# Patient Record
Sex: Female | Born: 1960
Health system: Southern US, Community
[De-identification: ages and names within clinical notes are randomized; demographics above are authoritative.]

## PROBLEM LIST (undated history)

## (undated) DIAGNOSIS — Z9889 Other specified postprocedural states: Secondary | ICD-10-CM

## (undated) DIAGNOSIS — K219 Gastro-esophageal reflux disease without esophagitis: Secondary | ICD-10-CM

## (undated) DIAGNOSIS — Z950 Presence of cardiac pacemaker: Secondary | ICD-10-CM

## (undated) DIAGNOSIS — IMO0001 Reserved for inherently not codable concepts without codable children: Secondary | ICD-10-CM

## (undated) DIAGNOSIS — F32A Depression, unspecified: Secondary | ICD-10-CM

## (undated) DIAGNOSIS — I4891 Unspecified atrial fibrillation: Secondary | ICD-10-CM

## (undated) DIAGNOSIS — I340 Nonrheumatic mitral (valve) insufficiency: Secondary | ICD-10-CM

## (undated) DIAGNOSIS — D649 Anemia, unspecified: Secondary | ICD-10-CM

## (undated) DIAGNOSIS — I5032 Chronic diastolic (congestive) heart failure: Secondary | ICD-10-CM

## (undated) DIAGNOSIS — F329 Major depressive disorder, single episode, unspecified: Secondary | ICD-10-CM

## (undated) DIAGNOSIS — I499 Cardiac arrhythmia, unspecified: Secondary | ICD-10-CM

## (undated) HISTORY — DX: Presence of cardiac pacemaker: Z95.0

## (undated) HISTORY — DX: Chronic diastolic (congestive) heart failure: I50.32

## (undated) HISTORY — PX: CATARACT EXTRACTION, BILATERAL: SHX1313

## (undated) HISTORY — DX: Nonrheumatic mitral (valve) insufficiency: I34.0

## (undated) HISTORY — DX: Unspecified atrial fibrillation: I48.91

---

## 1993-04-10 HISTORY — PX: CARDIAC PACEMAKER PLACEMENT: SHX583

## 2003-08-13 ENCOUNTER — Ambulatory Visit (HOSPITAL_COMMUNITY): Admission: RE | Admit: 2003-08-13 | Discharge: 2003-08-13 | Payer: Self-pay | Admitting: Obstetrics and Gynecology

## 2003-09-01 ENCOUNTER — Encounter: Admission: RE | Admit: 2003-09-01 | Discharge: 2003-11-30 | Payer: Self-pay | Admitting: Surgery

## 2003-09-09 HISTORY — PX: GASTRIC BYPASS: SHX52

## 2004-01-06 ENCOUNTER — Encounter: Admission: RE | Admit: 2004-01-06 | Discharge: 2004-04-05 | Payer: Self-pay | Admitting: Surgery

## 2004-01-19 ENCOUNTER — Inpatient Hospital Stay (HOSPITAL_COMMUNITY): Admission: RE | Admit: 2004-01-19 | Discharge: 2004-01-22 | Payer: Self-pay | Admitting: Surgery

## 2004-04-20 ENCOUNTER — Encounter: Admission: RE | Admit: 2004-04-20 | Discharge: 2004-07-19 | Payer: Self-pay | Admitting: Surgery

## 2004-05-31 ENCOUNTER — Ambulatory Visit: Payer: Self-pay | Admitting: Internal Medicine

## 2005-05-12 ENCOUNTER — Ambulatory Visit: Payer: Self-pay | Admitting: Internal Medicine

## 2006-04-10 HISTORY — PX: ABDOMINAL ADHESION SURGERY: SHX90

## 2006-05-05 ENCOUNTER — Inpatient Hospital Stay (HOSPITAL_COMMUNITY): Admission: EM | Admit: 2006-05-05 | Discharge: 2006-05-06 | Payer: Self-pay | Admitting: Surgery

## 2006-05-10 ENCOUNTER — Inpatient Hospital Stay (HOSPITAL_COMMUNITY): Admission: EM | Admit: 2006-05-10 | Discharge: 2006-05-15 | Payer: Self-pay | Admitting: Emergency Medicine

## 2006-05-10 ENCOUNTER — Encounter: Admission: RE | Admit: 2006-05-10 | Discharge: 2006-05-10 | Payer: Self-pay | Admitting: General Surgery

## 2006-07-04 ENCOUNTER — Ambulatory Visit: Payer: Self-pay | Admitting: Internal Medicine

## 2006-07-04 LAB — CONVERTED CEMR LAB
BUN: 12 mg/dL (ref 6–23)
Basophils Absolute: 0 10*3/uL (ref 0.0–0.1)
Basophils Relative: 0.8 % (ref 0.0–1.0)
CO2: 27 meq/L (ref 19–32)
Calcium: 8.7 mg/dL (ref 8.4–10.5)
Chloride: 109 meq/L (ref 96–112)
Creatinine, Ser: 0.6 mg/dL (ref 0.4–1.2)
Eosinophils Absolute: 0.1 10*3/uL (ref 0.0–0.6)
Eosinophils Relative: 1.6 % (ref 0.0–5.0)
GFR calc Af Amer: 139 mL/min
GFR calc non Af Amer: 115 mL/min
Glucose, Bld: 93 mg/dL (ref 70–99)
HCT: 32.2 % — ABNORMAL LOW (ref 36.0–46.0)
Hemoglobin: 10.7 g/dL — ABNORMAL LOW (ref 12.0–15.0)
INR: 1.1 (ref 0.9–2.0)
Lymphocytes Relative: 38.7 % (ref 12.0–46.0)
MCHC: 33.1 g/dL (ref 30.0–36.0)
MCV: 84.1 fL (ref 78.0–100.0)
Monocytes Absolute: 0.4 10*3/uL (ref 0.2–0.7)
Monocytes Relative: 7.3 % (ref 3.0–11.0)
Neutro Abs: 2.9 10*3/uL (ref 1.4–7.7)
Neutrophils Relative %: 51.6 % (ref 43.0–77.0)
Platelets: 206 10*3/uL (ref 150–400)
Potassium: 3.6 meq/L (ref 3.5–5.1)
Prothrombin Time: 12.8 s (ref 10.0–14.0)
RBC: 3.83 M/uL — ABNORMAL LOW (ref 3.87–5.11)
RDW: 16.7 % — ABNORMAL HIGH (ref 11.5–14.6)
Sodium: 143 meq/L (ref 135–145)
WBC: 5.5 10*3/uL (ref 4.5–10.5)
aPTT: 29.3 s (ref 26.5–36.5)

## 2006-07-06 ENCOUNTER — Ambulatory Visit: Payer: Self-pay | Admitting: Cardiology

## 2006-07-10 ENCOUNTER — Ambulatory Visit: Payer: Self-pay | Admitting: Internal Medicine

## 2006-07-10 ENCOUNTER — Ambulatory Visit (HOSPITAL_COMMUNITY): Admission: RE | Admit: 2006-07-10 | Discharge: 2006-07-10 | Payer: Self-pay | Admitting: Internal Medicine

## 2006-07-27 ENCOUNTER — Ambulatory Visit: Payer: Self-pay | Admitting: Internal Medicine

## 2007-08-01 ENCOUNTER — Ambulatory Visit: Payer: Self-pay | Admitting: Internal Medicine

## 2007-08-13 ENCOUNTER — Ambulatory Visit: Payer: Self-pay | Admitting: Cardiology

## 2007-09-11 ENCOUNTER — Inpatient Hospital Stay (HOSPITAL_BASED_OUTPATIENT_CLINIC_OR_DEPARTMENT_OTHER): Admission: RE | Admit: 2007-09-11 | Discharge: 2007-09-11 | Payer: Self-pay | Admitting: Cardiology

## 2007-09-11 ENCOUNTER — Ambulatory Visit: Payer: Self-pay | Admitting: Cardiology

## 2007-09-11 HISTORY — PX: CARDIAC CATHETERIZATION: SHX172

## 2007-09-26 ENCOUNTER — Ambulatory Visit: Payer: Self-pay | Admitting: Internal Medicine

## 2008-05-05 ENCOUNTER — Ambulatory Visit: Payer: Self-pay | Admitting: Internal Medicine

## 2008-05-05 LAB — CONVERTED CEMR LAB
BUN: 10 mg/dL (ref 6–23)
Basophils Absolute: 0 10*3/uL (ref 0.0–0.1)
Basophils Relative: 0.7 % (ref 0.0–3.0)
CO2: 26 meq/L (ref 19–32)
Calcium: 9 mg/dL (ref 8.4–10.5)
Chloride: 107 meq/L (ref 96–112)
Creatinine, Ser: 0.8 mg/dL (ref 0.4–1.2)
Eosinophils Absolute: 0.1 10*3/uL (ref 0.0–0.7)
Eosinophils Relative: 1.5 % (ref 0.0–5.0)
GFR calc Af Amer: 99 mL/min
GFR calc non Af Amer: 82 mL/min
Glucose, Bld: 99 mg/dL (ref 70–99)
HCT: 31.4 % — ABNORMAL LOW (ref 36.0–46.0)
Hemoglobin: 10.2 g/dL — ABNORMAL LOW (ref 12.0–15.0)
INR: 1.1 — ABNORMAL HIGH (ref 0.8–1.0)
Lymphocytes Relative: 25.1 % (ref 12.0–46.0)
MCHC: 32.6 g/dL (ref 30.0–36.0)
MCV: 80.6 fL (ref 78.0–100.0)
Monocytes Absolute: 0.3 10*3/uL (ref 0.1–1.0)
Monocytes Relative: 4.6 % (ref 3.0–12.0)
Neutro Abs: 4.5 10*3/uL (ref 1.4–7.7)
Neutrophils Relative %: 68.1 % (ref 43.0–77.0)
Platelets: 264 10*3/uL (ref 150–400)
Potassium: 4.4 meq/L (ref 3.5–5.1)
Prothrombin Time: 11.8 s (ref 10.9–13.3)
RBC: 3.9 M/uL (ref 3.87–5.11)
RDW: 17 % — ABNORMAL HIGH (ref 11.5–14.6)
Sodium: 139 meq/L (ref 135–145)
WBC: 6.6 10*3/uL (ref 4.5–10.5)
aPTT: 30.2 s — ABNORMAL HIGH (ref 21.7–29.8)
hCG, Beta Chain, Quant, S: 0.89 milliintl units/mL

## 2008-05-07 ENCOUNTER — Inpatient Hospital Stay (HOSPITAL_COMMUNITY): Admission: RE | Admit: 2008-05-07 | Discharge: 2008-05-12 | Payer: Self-pay | Admitting: Internal Medicine

## 2008-05-07 ENCOUNTER — Ambulatory Visit: Payer: Self-pay | Admitting: Internal Medicine

## 2008-05-07 ENCOUNTER — Ambulatory Visit: Payer: Self-pay | Admitting: Thoracic Surgery (Cardiothoracic Vascular Surgery)

## 2008-05-11 ENCOUNTER — Ambulatory Visit: Payer: Self-pay | Admitting: Infectious Diseases

## 2008-05-20 ENCOUNTER — Ambulatory Visit: Payer: Self-pay | Admitting: Internal Medicine

## 2008-05-20 ENCOUNTER — Ambulatory Visit: Payer: Self-pay

## 2008-05-20 LAB — CONVERTED CEMR LAB
Basophils Absolute: 0 10*3/uL (ref 0.0–0.1)
Basophils Relative: 0.1 % (ref 0.0–3.0)
Eosinophils Absolute: 0.2 10*3/uL (ref 0.0–0.7)
Eosinophils Relative: 2.2 % (ref 0.0–5.0)
HCT: 29.5 % — ABNORMAL LOW (ref 36.0–46.0)
Hemoglobin: 9.5 g/dL — ABNORMAL LOW (ref 12.0–15.0)
Lymphocytes Relative: 29.9 % (ref 12.0–46.0)
MCHC: 32.2 g/dL (ref 30.0–36.0)
MCV: 80.1 fL (ref 78.0–100.0)
Monocytes Absolute: 0.6 10*3/uL (ref 0.1–1.0)
Monocytes Relative: 8.7 % (ref 3.0–12.0)
Neutro Abs: 4 10*3/uL (ref 1.4–7.7)
Neutrophils Relative %: 59.1 % (ref 43.0–77.0)
Platelets: 214 10*3/uL (ref 150–400)
RBC: 3.68 M/uL — ABNORMAL LOW (ref 3.87–5.11)
RDW: 16.8 % — ABNORMAL HIGH (ref 11.5–14.6)
WBC: 6.9 10*3/uL (ref 4.5–10.5)

## 2008-05-28 ENCOUNTER — Ambulatory Visit: Payer: Self-pay | Admitting: Cardiology

## 2008-09-02 ENCOUNTER — Encounter (INDEPENDENT_AMBULATORY_CARE_PROVIDER_SITE_OTHER): Payer: Self-pay | Admitting: *Deleted

## 2008-11-02 ENCOUNTER — Encounter (INDEPENDENT_AMBULATORY_CARE_PROVIDER_SITE_OTHER): Payer: Self-pay | Admitting: *Deleted

## 2009-03-02 ENCOUNTER — Ambulatory Visit: Payer: Self-pay | Admitting: Internal Medicine

## 2009-03-02 DIAGNOSIS — Z950 Presence of cardiac pacemaker: Secondary | ICD-10-CM | POA: Insufficient documentation

## 2009-03-02 DIAGNOSIS — I442 Atrioventricular block, complete: Secondary | ICD-10-CM | POA: Insufficient documentation

## 2009-03-02 DIAGNOSIS — I4891 Unspecified atrial fibrillation: Secondary | ICD-10-CM | POA: Insufficient documentation

## 2009-06-04 ENCOUNTER — Encounter: Payer: Self-pay | Admitting: Internal Medicine

## 2009-06-21 ENCOUNTER — Encounter: Payer: Self-pay | Admitting: Internal Medicine

## 2009-06-30 ENCOUNTER — Ambulatory Visit: Payer: Self-pay | Admitting: Internal Medicine

## 2009-07-06 ENCOUNTER — Encounter: Payer: Self-pay | Admitting: Internal Medicine

## 2009-10-13 ENCOUNTER — Encounter: Payer: Self-pay | Admitting: Internal Medicine

## 2010-03-08 ENCOUNTER — Ambulatory Visit: Payer: Self-pay | Admitting: Internal Medicine

## 2010-03-10 ENCOUNTER — Encounter: Payer: Self-pay | Admitting: Internal Medicine

## 2010-04-30 ENCOUNTER — Encounter: Payer: Self-pay | Admitting: Surgery

## 2010-05-10 NOTE — Letter (Signed)
Summary: Device-Delinquent Phone Journalist, newspaper, Main Office  [address]   [place], [place] [address]   Phone: [phone]  Fax: [phone]     June 21, 2009 MRN: [id number]   Daily Robar [address] [place], [place]  [address]   Dear Ms. Smyth,  According to our records, you were scheduled for a device phone transmission on  June 02, 2009.     We did not receive any results from this check.  If you transmitted on your scheduled day, please call us to help troubleshoot your system.  If you forgot to send your transmission, please send one upon receipt of this letter.  Thank you,   Architectural technologist [facility]

## 2010-05-10 NOTE — Cardiovascular Report (Signed)
Summary: Office Visit   Office Visit   Imported By: Roderic Ovens 03/11/2009 16:13:40  _____________________________________________________________________  External Attachment:    Type:   Image     Comment:   External Document

## 2010-05-10 NOTE — Letter (Signed)
Summary: Appointment - Reminder 2  [facility], Main Office  [address]   [place], [place] [address]   Phone: [phone]  Fax: [phone]     Sep 02, 2008 MRN: [id number]   [name] [name] [address] [place], [place]  [address]   Dear [name],  Our records indicate that it is time to schedule a follow-up appointment.  Dr. Graciela Husbands recommended that you follow up with [place] in May, 2010. It is very important that we reach you to schedule this appointment.   We look forward to participating in your health care needs. Please contact us at the number listed above at your earliest convenience to schedule your appointment.  If you are unable to make an appointment at this time, give [place] a call so we can update our records.   Sincerely,   Burnard Leigh [facility] Scheduling Team

## 2010-05-10 NOTE — Letter (Signed)
Summary: Device-Delinquent Phone Journalist, newspaper, Main Office  [address]   [place], [place] [address]   Phone: [phone]  Fax: [phone]     June 04, 2009 MRN: [id number]   [name] [name] [address] [place], [place]  [address]   Dear Ms. Feldkamp,  According to our records, you were scheduled for a device phone transmission on   June 02, 2009.     We did not receive any results from this check.  If you transmitted on your scheduled day, please call us to help troubleshoot your system.  If you forgot to send your transmission, please send one upon receipt of this letter.  Thank you,   Architectural technologist [facility]

## 2010-05-10 NOTE — Letter (Signed)
Summary: Remote Device Check  [facility], Main Office  [address]   [place], [place] [address]   Phone: [phone]  Fax: [phone]     July 06, 2009 MRN: [id number]   Lurlie Schroepfer [address] [place], [place]  [address]   Dear Ms. Bruyere,   Your remote transmission was recieved and reviewed by your physician.  All diagnostics were within normal limits for you.  __X___Your next transmission is scheduled for:   October 06, 2009.  Please transmit at any time this day.  If you have a wireless device your transmission will be sent automatically.      Sincerely,  Proofreader

## 2010-05-10 NOTE — Cardiovascular Report (Signed)
Summary: Office Visit Remote   Office Visit Remote   Imported By: Roderic Ovens 07/08/2009 11:58:28  _____________________________________________________________________  External Attachment:    Type:   Image     Comment:   External Document

## 2010-05-10 NOTE — Letter (Signed)
Summary: Appointment - Reminder 2  [facility], Main Office  [address]   [place], [place] [address]   Phone: [phone]  Fax: [phone]     November 02, 2008 MRN: [id number]   Erinne Ruffini [address] [place] [address] [place], [place]  [address]   Dear Ms. Wille,  Our records indicate that you are overdue for a follow up appointment with Dr. Graciela Husbands.  It is very important that we reach you to schedule this appointment.   We look forward to participating in your health care needs. Please contact us at the number listed above at your earliest convenience to schedule your appointment.  If you are unable to make an appointment at this time, give [place] a call so we can update our records.  Sincerely,   Burnard Leigh [facility] Scheduling Team

## 2010-05-10 NOTE — Letter (Signed)
Summary: Device-Delinquent Phone Journalist, newspaper, Main Office  [address]   [place], [place] [address]   Phone: [phone]  Fax: [phone]     October 13, 2009 MRN: [id number]   [name] [name] [address] [place], [place]  [address]   Dear [name],  According to our records, you were scheduled for a device phone transmission on 10-06-2009.     We did not receive any results from this check.  If you transmitted on your scheduled day, please call us to help troubleshoot your system.  If you forgot to send your transmission, please send one upon receipt of this letter.  Thank you,   Architectural technologist [facility]

## 2010-05-10 NOTE — Assessment & Plan Note (Signed)
Summary: PC2      Allergies Added: NKDA  History of Present Illness: [name] is seen in followup for permanent atrial arrhythmias; she is status post AV junction ablation and pacemaker implantation and recent generator upgrade.   She is doing pretty well. She has mild chronic shortness of breath; there is no significant change in her peripheral edema. She does have chronic fatigue but she introduced this to the fact that she continues to work weekend night option  Current Medications (verified): 1)  Enalapril Maleate 10 Mg Tabs (Enalapril Maleate) .... Take One Tablet Once Daily 2)  Coreg 3.125 Mg Tabs (Carvedilol) .... Take Oen Tablet Two Times A Day  Allergies (verified): No Known Drug Allergies  Past History:  Past Medical History: Last updated: 03/01/2009  A history of pacemaker implantation 1995 at [facility] with atrioventricular node ablation. -Medtronic Adapta ADDRL1      a.     Generator change out 2 years ago.  Echocardiogram March 2009 ejection fraction of 50%.  Left heart catheterization September 11, 2007 angiographically normal       coronary arteries.  History of gastric bypass June 2005.  Lysis of adhesions January 2008.  Iron-deficiency anemia since the gastric bypass.  The patient is       anemic.  Her hemoglobin runs between 9 and 10.   Past Surgical History: Last updated: 03/01/2009 Pacemaker implantation- Medtronic Adapta ADDRL1 status post Roux-en-Y laparoscopic gastric bypass back in 2005 by Dr. Sandria Bales. Newman History of ablation secondary supraventricular tachycardia  Vital Signs:  Patient profile:   50 year old female Height:      65 inches Weight:      171 pounds BMI:     28.56 Pulse rate:   60 / minute BP sitting:   124 / 72  (right arm) Cuff size:   regular  Vitals Entered By: Judithe Modest CMA (March 02, 2009 11:10 AM)  Physical Exam  General:  The patient was alert and oriented in no acute  distress. HEENT Normal.  Neck veins were flat, carotids were brisk.  Lungs were clear.  Heart sounds were regular without murmurs or gallops. S2 is probably have a Abdomen was soft with active bowel sounds. There is no clubbing cyanosis trace edema Skin Warm and dry    PPM Specifications Following MD:  Sherryl Manges, MD     PPM Vendor:  Medtronic     PPM Model Number:  ADDRL1     PPM Serial Number:  [id number] PPM DOI:  05/11/2008     PPM Implanting MD:  Sherryl Manges, MD  Lead 1    Location: RA     DOI: 05/11/2008     Model #: [id number]     Serial #: [id number]     Status: active Lead 2    Location: RV     DOI: 05/11/2008     Model #: [id number]     Serial #: [id number]     Status: active  Magnet Response Rate:  BOL 85 ERI  65  Indications:  CHB  Explantation Comments:  RT IMPLANT.  LT IMPLANT REMOVED 2/10 FOR INFECTION  PPM Follow Up Remote Check?  No Battery Voltage:  2.79 V     Battery Est. Longevity:  8.5 years     Pacer Dependent:  Yes       PPM Device Measurements Atrium  Amplitude: 0.7 mV, Impedance: 563 ohms,  Right Ventricle  Impedance: 374 ohms, Threshold: 0.875 V at 0.4 msec  Episodes Coumadin:  No Ventricular High Rate:  0     Atrial Pacing:  22.6%     Ventricular Pacing:  100%  Parameters Mode:  DDIR     Lower Rate Limit:  60     Upper Rate Limit:  130 Paced AV Delay:  150     Next Remote Date:  06/02/2009     Next Cardiology Appt Due:  02/08/2010 Tech Comments:  No parameter changes.  Device function normal.  Carelink transmissions every 3 months.  ROV 1 year Dr. Graciela Husbands. Altha Harm, LPN  March 02, 2009 11:21 AM   Impression & Recommendations:  Problem # 1:  ATRIAL FIBRILLATION (ICD-427.31) She has chronic atrial arrhythmias with AV junction ablation Her updated medication list for this problem includes:    Coreg 3.125 Mg Tabs (Carvedilol) .Marland Kitchen... Take oen tablet two times a day  Problem # 2:  AV BLOCK, COMPLETE (ICD-426.0) She underwent AV junction ablation  because of a tachycardia induced cardiomyopathy; this is now largely resolved Her updated medication list for this problem includes:    Enalapril Maleate 10 Mg Tabs (Enalapril maleate) .Marland Kitchen... Take one tablet once daily    Coreg 3.125 Mg Tabs (Carvedilol) .Marland Kitchen... Take oen tablet two times a day  Problem # 3:  CARDIOMYOPATHY,TACHYCARDIA-RESOLVED (ICD-425.9) the patient be continued on her ACE inhibitor and beta blockers Her updated medication list for this problem includes:    Enalapril Maleate 10 Mg Tabs (Enalapril maleate) .Marland Kitchen... Take one tablet once daily    Coreg 3.125 Mg Tabs (Carvedilol) .Marland Kitchen... Take oen tablet two times a day  Problem # 4:  PACEMAKER,MDT (ICD-V45.01) normal device function

## 2010-05-10 NOTE — Miscellaneous (Signed)
Summary: Device preload  Clinical Lists Changes  Observations: Added new observation of PPMEXPLCOMM: RT IMPLANT.  LT IMPLANT REMOVED 2/10 FOR INFECTION (05/20/2008 12:56) Added new observation of PPM INDICATN: CHB (05/20/2008 12:56) Added new observation of MAGNET RTE: BOL 85 ERI  65 (05/20/2008 12:56) Added new observation of PPMLEADSTAT2: active (05/20/2008 12:56) Added new observation of PPMLEADSER2: [id number] (05/20/2008 12:56) Added new observation of PPMLEADMOD2: 5076  (05/20/2008 12:56) Added new observation of PPMLEADDOI2: 05/11/2008  (05/20/2008 12:56) Added new observation of PPMLEADLOC2: RV  (05/20/2008 12:56) Added new observation of PPMLEADSTAT1: active  (05/20/2008 12:56) Added new observation of PPMLEADSER1: [id number]  (05/20/2008 12:56) Added new observation of PPMLEADMOD1: 5076  (05/20/2008 12:56) Added new observation of PPMLEADDOI1: 05/11/2008  (05/20/2008 12:56) Added new observation of PPMLEADLOC1: RA  (05/20/2008 12:56) Added new observation of PPM IMP MD: Sherryl Manges, MD  (05/20/2008 12:56) Added new observation of PPM DOI: 05/11/2008  (05/20/2008 12:56) Added new observation of PPM SERL#: [id number]  (05/20/2008 12:56) Added new observation of PPM MODL#: ADDRL1  (05/20/2008 37:10) Added new observation of PACEMAKERMFG: Medtronic  (05/20/2008 12:56) Added new observation of CARDIO MD: Sherryl Manges, MD  (05/20/2008 12:56)      PPM Specifications Following MD:  Sherryl Manges, MD     PPM Vendor:  Medtronic     PPM Model Number:  ADDRL1     PPM Serial Number:  [id number] PPM DOI:  05/11/2008       Lead 1    Location: RA     DOI: 05/11/2008     Model #: [id number]     Serial #: [id number]     Status: active Lead 2    Location: RV     DOI: 05/11/2008     Model #: [id number]     Serial #: [id number]     Status: active  Magnet Response Rate:  BOL 85 ERI  65  Indications:  CHB  Explantation Comments:  RT IMPLANT.  LT IMPLANT REMOVED 2/10 FOR INFECTION  ICD  Specifications Following MD: Sherryl Manges, MD

## 2010-05-10 NOTE — Assessment & Plan Note (Signed)
Summary: MEDTRONIC  Medications Added FUROSEMIDE 20 MG TABS (FUROSEMIDE) daily for two weeks,  then every other day.      Allergies Added: NKDA  Visit Type:  PPM-Medtronic  CC:  shortness of breath and swelling.  History of Present Illness: [name] [name] is seen in followup for permanent atrial arrhythmias; she is status post AV junction ablation and pacemaker implantation and recent generator upgrade that was complicated by infection requiring extraction and replacing on the contralateral (now right) side  She is doing pretty well. She has mild chronic shortness of breath; there is no significant change in her peripheral edema. She is noted however significant change in her dyspnea as well as peripheral edema over the last couple of months. There've been no company chest pain. Some years ago she had a short course of diuretics associated with similar symptoms with which there was improvement.  Problems Prior to Update: 1)  Cardiomyopathy,tachycardia-resolved  (ICD-425.9) 2)  Pacemaker,mdt  (ICD-V45.01) 3)  Av Block, Complete  (ICD-426.0) 4)  Atrial Fibrillation  (ICD-427.31)  Current Medications (verified): 1)  Enalapril Maleate 10 Mg Tabs (Enalapril Maleate) .... Take One Tablet Once Daily 2)  Coreg 3.125 Mg Tabs (Carvedilol) .... Take Oen Tablet Two Times A Day  Allergies (verified): No Known Drug Allergies  Past History:  Past Medical History: Last updated: 03/01/2009  A history of pacemaker implantation 1995 at [facility] with atrioventricular node ablation. -Medtronic Adapta ADDRL1      a.     Generator change out 2 years ago.  Echocardiogram March 2009 ejection fraction of 50%.  Left heart catheterization September 11, 2007 angiographically normal       coronary arteries.  History of gastric bypass June 2005.  Lysis of adhesions January 2008.  Iron-deficiency anemia since the gastric bypass.  The patient is       anemic.  Her hemoglobin runs  between 9 and 10.   Past Surgical History: Last updated: 03/01/2009 Pacemaker implantation- Medtronic Adapta ADDRL1 status post Roux-en-Y laparoscopic gastric bypass back in 2005 by Dr. Sandria Bales. Newman History of ablation secondary supraventricular tachycardia  Family History: Last updated: 03/01/2009 noncontributory  Social History: Last updated: 03/01/2009 Full Time-Nurse at [employer] Married- Tobacco Use - No.  Alcohol Use - no  Risk Factors: Smoking Status: never (03/01/2009)  Vital Signs:  Patient profile:   50 year old female Height:      65 inches Weight:      182 pounds BMI:     30.40 Pulse rate:   65 / minute BP sitting:   119 / 74  (left arm) Cuff size:   regular  Vitals Entered By: Caralee Ates CMA (March 08, 2010 12:41 PM)  Physical Exam  General:  The patient was alert and oriented in no acute distress. HEENT Normal.  Neck veins were 7-8 cm Lungs were clear.  Heart sounds were regular without murmurs or gallops.  Abdomen was soft with active bowel sounds. There is no clubbing cyanosis; 2 = edema Skin Warm and dry    PPM Specifications Following MD:  Sherryl Manges, MD     PPM Vendor:  Medtronic     PPM Model Number:  ADDRL1     PPM Serial Number:  [id number] [facility] PPM DOI:  05/11/2008     PPM Implanting MD:  Sherryl Manges, MD  Lead 1    Location: RA     DOI: 05/11/2008     Model #:  [id number]     Serial #: [id number]     Status: active Lead 2    Location: RV     DOI: 05/11/2008     Model #: [id number]     Serial #: [id number]     Status: active  Magnet Response Rate:  BOL 85 ERI  65  Indications:  CHB  Explantation Comments:  RT IMPLANT.  LT IMPLANT REMOVED 2/10 FOR INFECTION  PPM Follow Up Remote Check?  No Battery Voltage:  2.79 V     Battery Est. Longevity:  10 years     Pacer Dependent:  Yes       PPM Device Measurements Atrium  Amplitude: 0.7 mV, Impedance: 522 ohms,  Right Ventricle  Impedance: 354 ohms, Threshold: 1.0 V at 0.4  msec  Episodes Coumadin:  No Atrial Pacing:  26.9%     Ventricular Pacing:  99.9%  Parameters Mode:  DDIR     Lower Rate Limit:  60     Upper Rate Limit:  130 Paced AV Delay:  150     Next Remote Date:  06/09/2010     Next Cardiology Appt Due:  02/09/2011 Tech Comments:  No parameter changes.  Device function normal.  Carelink transmissions every 3 months.  A-fib/dependent, - coumadin.  ROV 1 year with Dr. Graciela Husbands. Altha Harm, LPN  March 08, 2010 12:40 PM   Impression & Recommendations:  Problem # 1:  CARDIOMYOPATHY,TACHYCARDIA-RESOLVED (ICD-425.9)  I am concerned that there has been worsening of her left ventricular systolic function potentially as a consequence of chronic right ventricular pacing. We'll check a 2-D echo.  Her updated medication list for this problem includes:    Enalapril Maleate 10 Mg Tabs (Enalapril maleate) .Marland Kitchen... Take one tablet once daily    Coreg 3.125 Mg Tabs (Carvedilol) .Marland Kitchen... Take oen tablet two times a day  Orders: Echocardiogram (Echo)  Problem # 2:  PACEMAKER,MDT (ICD-V45.01) Device parameters and data were reviewed and no changes were made  Problem # 3:  CHF (ICD-428.0)  we'll begin her a diuretic Lasix 20 mg daily for 2 weeks to every other day. We'll obtainb a BMP and a BNP. Her updated medication list for this problem includes:    Enalapril Maleate 10 Mg Tabs (Enalapril maleate) .Marland Kitchen... Take one tablet once daily    Coreg 3.125 Mg Tabs (Carvedilol) .Marland Kitchen... Take oen tablet two times a day    Furosemide 20 Mg Tabs (Furosemide) .Marland Kitchen... Daily for two weeks,  then every other day.  Her updated medication list for this problem includes:    Enalapril Maleate 10 Mg Tabs (Enalapril maleate) .Marland Kitchen... Take one tablet once daily    Coreg 3.125 Mg Tabs (Carvedilol) .Marland Kitchen... Take oen tablet two times a day  Orders: Echocardiogram (Echo)  Problem # 4:  AV BLOCK, COMPLETE (ICD-426.0)  stable post Her updated medication list for this problem includes:     Enalapril Maleate 10 Mg Tabs (Enalapril maleate) .Marland Kitchen... Take one tablet once daily    Coreg 3.125 Mg Tabs (Carvedilol) .Marland Kitchen... Take oen tablet two times a day  Her updated medication list for this problem includes:    Enalapril Maleate 10 Mg Tabs (Enalapril maleate) .Marland Kitchen... Take one tablet once daily    Coreg 3.125 Mg Tabs (Carvedilol) .Marland Kitchen... Take oen tablet two times a day  Problem # 5:  ATRIAL FIBRILLATION (ICD-427.31)  permanent; She has. CHADS VASC score of one at this point we will reassess following her echo Her updated medication list for this  problem includes:    Coreg 3.125 Mg Tabs (Carvedilol) .Marland Kitchen... Take oen tablet two times a day  Orders: Echocardiogram (Echo)  Patient Instructions: 1)  Your physician recommends that you have BMET/BNP at [facility].  2)  Your physician has requested that you have an echocardiogram at [facility].  Echocardiography is a painless test that uses sound waves to create images of your heart. It provides your doctor with information about the size and shape of your heart and how well your heart's chambers and valves are working.  This procedure takes approximately one hour. There are no restrictions for this procedure. 3)  Your physician has recommended you make the following change in your medication: 20mg  Lasix daily for 2 weeks then every other day.  4)  Your physician wants you to follow-up in: 1 year. You will receive a reminder letter in the mail two months in advance. If you don't receive a letter, please call our office to schedule the follow-up appointment. Prescriptions: FUROSEMIDE 20 MG TABS (FUROSEMIDE) daily for two weeks,  then every other day.  #30 x 6   Entered by:   Claris Gladden RN   Authorized by:   Nathen May, MD, [place]   Signed by:   Nathen May, MD, [facility] on 03/08/2010   Method used:   Electronically to        [facility]  [address] [id number] (retail)       [address]       [place], [place]   [address]       Ph: [phone] or [phone]       Fax: [phone]   RxID:   [id number]

## 2010-06-09 ENCOUNTER — Encounter: Payer: Self-pay | Admitting: Internal Medicine

## 2010-06-09 ENCOUNTER — Encounter (INDEPENDENT_AMBULATORY_CARE_PROVIDER_SITE_OTHER): Payer: PRIVATE HEALTH INSURANCE

## 2010-06-09 DIAGNOSIS — I495 Sick sinus syndrome: Secondary | ICD-10-CM

## 2010-06-27 ENCOUNTER — Encounter: Payer: Self-pay | Admitting: *Deleted

## 2010-07-07 NOTE — Cardiovascular Report (Signed)
Summary: Office Visit   Office Visit   Imported By: Roderic Ovens 06/28/2010 16:23:26  _____________________________________________________________________  External Attachment:    Type:   Image     Comment:   External Document

## 2010-07-07 NOTE — Letter (Signed)
Summary: Remote Device Check  [facility], Main Office  [address]   [place], [place] [address]   Phone: [phone]  Fax: [phone]     June 27, 2010 MRN: [id number]   Jeris Tangen [address] [place], [place]  [address]   Dear Ms. Dorce,   Your remote transmission was recieved and reviewed by your physician.  All diagnostics were within normal limits for you.  __X___Your next transmission is scheduled for:  09-08-2010.  Please transmit at any time this day.  If you have a wireless device your transmission will be sent automatically.   Sincerely,  Vella Kohler

## 2010-07-25 LAB — BASIC METABOLIC PANEL
BUN: 6 mg/dL (ref 6–23)
BUN: 7 mg/dL (ref 6–23)
CO2: 22 mEq/L (ref 19–32)
CO2: 23 mEq/L (ref 19–32)
Calcium: 8.1 mg/dL — ABNORMAL LOW (ref 8.4–10.5)
Calcium: 8.1 mg/dL — ABNORMAL LOW (ref 8.4–10.5)
Chloride: 105 mEq/L (ref 96–112)
Chloride: 109 mEq/L (ref 96–112)
Creatinine, Ser: 0.72 mg/dL (ref 0.4–1.2)
Creatinine, Ser: 0.76 mg/dL (ref 0.4–1.2)
GFR calc Af Amer: >60 mL/min (ref >60)
GFR calc Af Amer: >60 mL/min (ref >60)
GFR calc non Af Amer: >60 mL/min (ref >60)
GFR calc non Af Amer: >60 mL/min (ref >60)
Glucose, Bld: 91 mg/dL (ref 70–99)
Glucose, Bld: 98 mg/dL (ref 70–99)
Potassium: 3.7 mEq/L (ref 3.5–5.1)
Potassium: 3.8 mEq/L (ref 3.5–5.1)
Sodium: 133 mEq/L — ABNORMAL LOW (ref 135–145)
Sodium: 138 mEq/L (ref 135–145)

## 2010-07-25 LAB — DIFFERENTIAL
Basophils Absolute: 0 10*3/uL (ref 0.0–0.1)
Basophils Absolute: 0 10*3/uL (ref 0.0–0.1)
Basophils Relative: 1 % (ref 0–1)
Basophils Relative: 1 % (ref 0–1)
Eosinophils Absolute: 0.1 10*3/uL (ref 0.0–0.7)
Eosinophils Absolute: 0.1 10*3/uL (ref 0.0–0.7)
Eosinophils Relative: 2 % (ref 0–5)
Eosinophils Relative: 4 % (ref 0–5)
Lymphocytes Relative: 15 % (ref 12–46)
Lymphocytes Relative: 25 % (ref 12–46)
Lymphs Abs: 0.5 10*3/uL — ABNORMAL LOW (ref 0.7–4.0)
Lymphs Abs: 0.8 10*3/uL (ref 0.7–4.0)
Monocytes Absolute: 0.1 10*3/uL (ref 0.1–1.0)
Monocytes Absolute: 0.3 10*3/uL (ref 0.1–1.0)
Monocytes Relative: 11 % (ref 3–12)
Monocytes Relative: 3 % (ref 3–12)
Neutro Abs: 1.8 10*3/uL (ref 1.7–7.7)
Neutro Abs: 2.5 10*3/uL (ref 1.7–7.7)
Neutrophils Relative %: 58 % (ref 43–77)
Neutrophils Relative %: 78 % — ABNORMAL HIGH (ref 43–77)

## 2010-07-25 LAB — CBC
HCT: 25.7 % — ABNORMAL LOW (ref 36.0–46.0)
HCT: 25.8 % — ABNORMAL LOW (ref 36.0–46.0)
Hemoglobin: 8.2 g/dL — ABNORMAL LOW (ref 12.0–15.0)
Hemoglobin: 8.4 g/dL — ABNORMAL LOW (ref 12.0–15.0)
MCHC: 31.8 g/dL (ref 30.0–36.0)
MCHC: 32.6 g/dL (ref 30.0–36.0)
MCV: 79.4 fL (ref 78.0–100.0)
MCV: 80.7 fL (ref 78.0–100.0)
Platelets: 181 10*3/uL (ref 150–400)
Platelets: 195 10*3/uL (ref 150–400)
RBC: 3.18 MIL/uL — ABNORMAL LOW (ref 3.87–5.11)
RBC: 3.24 MIL/uL — ABNORMAL LOW (ref 3.87–5.11)
RDW: 18.2 % — ABNORMAL HIGH (ref 11.5–15.5)
RDW: 18.5 % — ABNORMAL HIGH (ref 11.5–15.5)
WBC: 3.1 10*3/uL — ABNORMAL LOW (ref 4.0–10.5)
WBC: 3.2 10*3/uL — ABNORMAL LOW (ref 4.0–10.5)

## 2010-07-25 LAB — CROSSMATCH
ABO/RH(D): O NEG
Antibody Screen: NEGATIVE

## 2010-07-25 LAB — ABO/RH: ABO/RH(D): O NEG

## 2010-07-26 LAB — SEDIMENTATION RATE: Sed Rate: 12 mm/hr (ref 0–22)

## 2010-08-23 NOTE — Discharge Summary (Signed)
NAME:  [name] [name], [name] [name]             ACCOUNT NO.:  [id number]   MEDICAL RECORD NO.:  [id number]          PATIENT TYPE:  INP   LOCATION:  2922                         FACILITY:  [facility]   PHYSICIAN:  [name], MD, FACCDATE OF BIRTH:  1960/11/24   DATE OF ADMISSION:  05/07/2008  DATE OF DISCHARGE:  05/12/2008                               DISCHARGE SUMMARY   This patient has no known drug allergies.   TIME FOR THIS DICTATION EXAMINATION AND PREPARATION OF DISCHARGE:  Greater than 45 minutes.   FINAL DIAGNOSES:  1. Discharging day 1 status post implant of a Medtronic Adapta dual-      chamber pacemaker.  2. Discharging day 1 status post explantation of a temporary permanent      electrode.  3. Admitted with pacer pocket infection.  The patient was on Bactrim      and Keflex for a 1-week period prior to this admission.      a.     Explantation of device and leads May 07, 2008.       (Discharging day #5 today).      b.     Implant of a temporary permanent electrode May 07, 2008.   SECONDARY DIAGNOSES:  1. A history of pacemaker implantation 1995 at [facility] with atrioventricular node ablation.      a.     Generator change out 2 years ago.  2. Echocardiogram March 2009 ejection fraction of 50%.  3. Left heart catheterization September 11, 2007 angiographically normal      coronary arteries.  4. History of gastric bypass June 2005.  5. Lysis of adhesions January 2008.  6. Iron-deficiency anemia since the gastric bypass.  The patient is      anemic.  Her hemoglobin runs between 9 and 10.   PROCEDURES THIS ADMISSION:  May 07, 2008 in the operating room  extraction of existing infected pacemaker and leads with placement of  permanent temporary electrode on the left side.  The patient will  continue on IV antibiotics through this hospitalization.  On day 4 after  the explantation, the patient had re-implant of a Medtronic Adapta dual-  chamber  pacemaker on the contralateral side, the right side as well as  explantation of the temporary permanent electrode by Dr. Lewayne Bunting.   CONSULT THIS ADMISSION:  Infectious Disease for duration of antibiotic  therapy.  Recommended doxycycline 100 mg twice daily for 14 days with  recheck of ESR.   BRIEF HISTORY:  Ms. Millon is a 50 year old female.  She has a history  of tachycardia mediated cardiomyopathy.  She had an AV nodal ablation  with a DDD pacemaker implanted in 1995.  About 2 years ago, she had a  pacemaker generator change.   The patient had gastric bypass surgery about 1 month ago and she  developed pain, swelling and tenderness at the pacemaker pocket that has  impending erosion.  The patient is admitted May 07, 2008 for pacing  system extraction both leads and the generator itself.  The patient has  complete heart block  with ventricular escape in the 30s that she will  require a temporary permanent pacemaker.   HOSPITAL COURSE:  The patient presents electively on May 07, 2008.  She was taken to the operating room where the generator leads were  carefully extracted.  She has been on IV antibiotics throughout this  hospitalization.  By postprocedure day #4, the patient was judged a  suitable candidate for reimplantation on the contralateral side.  She  received a Medtronic Adapta dual-chamber pacemaker.  There were no  complication to this procedure.  The device has been interrogated after  the implantation.  All values are within normal limits.  The chest x-ray  shows no pneumothorax and that the leads are in appropriate position.  The patient has had some anemia with her last hemoglobin on May 09, 2008 as 8.2, hematocrit 25.7, white cells 3.1 and platelets of 181.  The  patient also as mentioned above had Infectious Disease consult with  request for duration of antibiotic therapy and the type of antibiotic.  It was decided that the patient would go home on  doxycycline 100 mg  orally twice daily for 14 days.  An erythrocyte sedimentation rate has  been taken on May 12, 2008 the day of discharge and is now pending.  We will also obtain a followup complete blood count when she presents to  the office on Wednesday, May 20, 2008.  The patient discharges with  the instruction to keep her incision dry for the next 7 days, to sponge  bathe until Monday, May 18, 2008.  Her medications at discharge are  1. A new medication doxycycline 100 mg twice daily for 14 days.  2. Enalapril 10 mg daily.  3. Coreg 3.125 mg twice daily.   She has followup appointments at [facility].  1. [place] office for suture removal Wednesday, May 20, 2008 at 9      o'clock.  A complete blood count will also be obtained at that      time.  2. She presents to the [facility] Tuesday, May 26, 2008 for      ESR recheck.  3. She sees Dr. Graciela Husbands at the [facility] office Friday, June 26, 2008 at 3      o'clock  4. She sees Dr. Graciela Husbands at [facility] in [place] in May.  Dr.      Odessa Fleming office in [place] will call that appointment.   LABORATORY STUDIES THIS ADMISSION:  On May 09, 2008 hemoglobin was  8.2, hematocrit 25.7, white cells 3.1, and platelets of 181.  Serum  electrolytes May 09, 2008 sodium 138, potassium 3.7, chloride 109,  carbonate 23, BUN is 6, creatinine 0.76 and glucose 98.  Once again ESR  is pending at discharge.      Maple Mirza, [place]      [name], MD, [facility]  Electronically Signed    GM/MEDQ  D:  05/12/2008  T:  05/12/2008  Job:  [id number]

## 2010-08-23 NOTE — Letter (Signed)
July 27, 2006    [name] [name]  [address] [place], [place] [address]   RE:  [name] [name], [name] [name]  MRN:  [id number]  /  DOB:  December 16, 1960   Dear Renae Fickle,   [name] [name] comes in today, following pacemaker generator replacement.  She continues to do well.   On review of her medications, I note that she is on an ACE inhibitor,  but because of intolerances to beta blockers at higher doses, we never  put her back on it.  I wonder whether this is imprudent.   Her blood pressure is 102/59.  Lungs were clear.  Heart sounds were  regular and the device pocket was well-healed.   I have given her a prescription today for Coreg 3.125 to take b.i.d. and  to cut her enalapril in half.  Hopefully, she will be able to tolerate  this combination.   We will plan to see her again in a year.   Hope this letter finds you well.    Sincerely,      [name], MD, [facility]  Electronically Signed    SCK/MedQ  DD: 07/27/2006  DT: 07/28/2006  Job #: [id number]

## 2010-08-23 NOTE — Op Note (Signed)
NAME:  [name], [name]             ACCOUNT NO.:  [id number]   MEDICAL RECORD NO.:  [id number]          PATIENT TYPE:  INP   LOCATION:  2922                         FACILITY:  [facility]   PHYSICIAN:  [name], MD, FACCDATE OF BIRTH:  02-25-1961   DATE OF PROCEDURE:  05/11/2008  DATE OF DISCHARGE:                               OPERATIVE REPORT   PREOPERATIVE DIAGNOSIS:  Status post explantation of a previously  implanted device with pocket infection.   POSTOPERATIVE DIAGNOSIS:  Status post explantation of a previously  implanted device with pocket infection.   PROCEDURES:  Implantation of dual-chamber device and removal of a  temporary permanent transvenous pacing electrode.   Following obtaining informed consent, the patient was brought to the  electrophysiology laboratory and placed on the fluoroscopic table in a  supine position.  After routine prep and drape of the right upper chest,  lidocaine was infiltrated in prepectoral subclavicular region.  An  incision was made and carried down to the layer of the prepectoral  fascia.  Using electrocautery and sharp dissection a pocket was  performed similarly.  Hemostasis was obtained.   Then, attention was turned for gaining access to the extrathoracic right  subclavian vein, which was accomplished without difficulty and without  the aspiration of air or puncture of the artery.  Two separate  venipunctures were accomplished.  Guidewires were placed and retained.  A 7-French sheaths were placed, which were passed through Medtronic  5076, 52-cm active fixation ventricular lead, serial number [id number]  and then atrial lead, serial number [id number], this was a Medtronic  5076, 45-cm length lead.  The ventricular lead was marked with a tie.  Under fluoroscopic guidance, these leads were manipulated to the right  ventricular septum and the right atrial appendage respectively where the  bipolar paced R-wave was 8.5 with a pace  impedance of 858 ohms with  threshold 0.9 V at 0.5 msec.  Currently, threshold is 1.2 mA.  There was  no diaphragmatic pacing at 10 V and the current of injury was brisk.   The bipolar P-wave was 5.5 with pace impedance of 1044 ohms.  Threshold  immediately after deployment of 1.3 V at 0.5 msec.  Currently, threshold  is 0.3 mA.  There is no diaphragmatic pacing at 10 V.  The current of  injury was also brisk.  These leads were secured to the prepectoral  fascia and then attached to Medtronic Adapta L pulse generator, serial  number [id number], P-synchronous pacing of the patient's already  recorded atrial tachycardia was identified.  The pocket was examined and  hemostasis was obtained.  The leads in the pulse generator were placed  in the pocket and secured to the prepectoral fascia.  The wound was then  closed in 2 layers in the normal fashion.  The wound was washed, dried  and a benzoin Steri-Strip dressing was applied.  Needle counts, sponge  counts, and instrument counts were correct at end of the procedure  according to staff.   Under fluoroscopic guidance, the previously implanted temporary  permanent transvenous electrode was visualized.  It was freed up  from  its anchoring and then under fluoroscopy as noted, the screw was  retracted and the lead was withdrawn and removed from the body.  Pressure was held.  The patient tolerated the procedure without apparent  complications.      [name], MD, [facility]  Electronically Signed     SCK/MEDQ  D:  05/11/2008  T:  05/11/2008  Job:  [id number]   cc:   Hillery Aldo, Jonita Albee

## 2010-08-23 NOTE — Assessment & Plan Note (Signed)
[facility]                         ELECTROPHYSIOLOGY OFFICE NOTE   NAME:[name], [name]                      MRN:          [id number]  DATE:05/05/2008                            DOB:          09-20-1960    [name] [name] is a 50 year old woman  with a history of tachycardia  induced cardiomyopathy status post AV ablation and DDD pacing in 1995.  She underwent generator replacement in April of 2008.  Over Christmas  she noted discomfort and subsequent swelling and then erythema at the  lower lateral margin of her pacemaker generator pocket.  She has had no  systemic symptoms of fever or chills and there has been no drainage.  She does not recall any trauma.  She saw Dr. Neita Carp last week.  He put  her on Bactrim and cephalexin which she has taken without any  improvement in the erythema.   Her past cardiac history in addition to above is noted to attempted  ablation of her arrhythmias at [facility] followed by AV junction ablation and  pacemaker implantation there.  Ejection fraction in 1992 had estimated  30% with normal coronaries.  Most recent ejection fraction in March of  2008 was about 50%.   She underwent catheterization because of symptoms of shortness of breath  and exertional chest discomfort and a previously abnormal Myoview scan  and her ejection fraction was 45% and her coronaries were normal.  This  was in June of 2009.   She is also status post gastric bypass in October of 2005 and she has  lost from 320 pounds to 165 pounds.   This was complicated by lysis of adhesions required in January of 2008  and internal herniorrhaphies that were corrected surgically.   ALLERGIES:  No known drug allergies.   MEDICATIONS:  Enalapril 10, Coreg 3.125 b.i.d., Bactrim and cephalexin.   SOCIAL HISTORY:  She is married.  She has a couple of kids.  She works  as a Engineer, civil (consulting) at [employer].   REVIEW OF SYSTEMS:  Across multiple organ systems is  negative.   PHYSICAL EXAMINATION:  Blood pressure is 117/72, pulse 62, weight is  165.  She is in no acute distress.  HEENT:  Demonstrated no icterus or xanthelasma.  Neck veins are flat. Carotids are brisk and full bilaterally without  bruits.  BACK:  Without kyphosis or scoliosis.  LUNGS:  Clear.  HEART:  Regular without murmurs or gallops.  Chest wall notable for erythematous and retracted portion underneath the  left lateral margin of her pacemaker.  ABDOMEN:  Soft with active bowel sounds.  EXTREMITIES:  Without edema.  No clubbing or cyanosis.  NEUROLOGIC:  Grossly normal.  SKIN:  Warm and dry.   Interrogation of her Medtronic pulse generator demonstrated that she is  currently in sinus rhythm with a P wave of 1.4,  pacing impendence of  492, threshold of 1 volt.  R wave was 11.2 with pacing impendence of  660, threshold of 0.5 and 0.4.  She has a ventricular escape rate in the  30s.   IMPRESSION:  1. Status post AV junction ablation  nd dual chamber pacemaker      implantation in [facility] in 1995 with device generator replacement in      2008.  2. Pocket infection as noted above.  3. Status post gastric bypass surgery.  4. Resolved (largely) tachycardia induced cardiomyopathy.   [name] [name] has unfortunately a pocket infection in the setting of  being device dependent, a dual chamber device that is very old.  She is  going to need explantation of the device.  She was seen in concert with  Dr. Ladona Ridgel today.   PLAN:  Admission to hospital with placement of temporary transvenous  permanent pacemaker, extraction of the two leads, subsequent antibiotics  and implantation of a generator on the other side.   We have reviewed potential risks and benefits including but not limited  to death, perforation, bleeding requiring open chest surgery.   She understands and is willing to proceed.     [name], MD, [facility]  Electronically Signed    SCK/MedQ  DD: 05/05/2008  DT:  05/05/2008  Job #: [id number]   cc:   Fara Chute

## 2010-08-23 NOTE — Cardiovascular Report (Signed)
NAME:  [name] [name], [name] [name]             ACCOUNT NO.:  [id number]   MEDICAL RECORD NO.:  [id number]          PATIENT TYPE:  OIB   LOCATION:  1963                         FACILITY:  [facility]   PHYSICIAN:  Bruce R. Juanda Chance, MD, FACCDATE OF BIRTH:  1960/04/27   DATE OF PROCEDURE:  09/11/2007  DATE OF DISCHARGE:  09/11/2007                            CARDIAC CATHETERIZATION   HISTORY:  Mrs. Peppers is 50 years old and has a history of tachycardia-  induced cardiomyopathy and has had AV nodal ablation and a DDD pacemaker  implanted.  She also has had gastric bypass surgery and lost 200 pounds.  Her ejection fraction had improved to 45% by last measure.  Recently,  she has been having exertional chest pain.  Dr. Graciela Husbands saw her and  arranged for her to have a catheterization in the [facility] lab.  She had a  Myoview done in 2007, which was borderline abnormal and Dr. Graciela Husbands felt  catheterization was the best way to evaluate her symptoms.   PROCEDURE:  Left heart catheterization was performed percutaneously via  right femoral artery and arterial sheath and 4-French sheath from  coronary catheters.  A femoral arterial puncture was performed and  Omnipaque contrast was used.  The patient tolerated the procedure well  and left laboratory in satisfactory condition.   RESULTS:  Left main coronary artery:  The left main coronary artery was  free of any disease.   Left anterior descending artery:  Left anterior descending artery gave  rise to 3 septal perforator and diagonal branch.  These in the LAD  proper were free of significant disease.   The circumflex artery:  The circumflex artery gave rise to a ramus  branch, a marginal branch, and 2 posterolateral branches.  These vessels  were free of significant disease.   The right coronary artery:  The right coronary artery is a moderate-  sized vessel and gave rise to a conus branch, ventricle branch, a  posterior descending branch, and a posterolateral branch.   These vessels  were free of significant disease.   The left ventriculogram:  The left ventriculogram performed on RAO  projection showed mild global hypokinesis.  The estimated ejection  fraction was 45%.   The aortic pressure was 105/58 with a mean of 78 and left ventricular  pressure was 105/40.   CONCLUSION:  1. Normal coronary angiography.  2. Mild global hypokinesis with an estimated fraction of 45%.   RECOMMENDATIONS:  The patient has no source of ischemia to account for  her recent exertional chest pain.  Her LV function by angiography is  similar to what the measurements were by echocardiography.  We will plan  to followup with Dr. Graciela Husbands to who can decided regarding further  evaluation of chest pain.      Bruce Elvera Lennox Juanda Chance, MD, [facility]  Electronically Signed     BRB/MEDQ  D:  09/11/2007  T:  09/12/2007  Job:  [id number]   cc:   [name], MD, [facility]

## 2010-08-23 NOTE — Cardiovascular Report (Signed)
[facility]                   [place] ELECTROPHYSIOLOGY DEVICE CLINIC NOTE   NAME:[name] [name], [name] [name]                      MRN:          [id number]  DATE:09/26/2007                            DOB:          [birth year]    HISTORY OF PRESENT ILLNESS:  [name] [name] was seen in followup for  complete heart block, status post AV junction ablation for uncontrolled  atrial arrhythmia.  She also had new onset exertional chest pain in  April.  She underwent Myoview scan that was abnormal and catheterization  which was normal.  She is feeling well without recurrent complaints and  without problems with shortness of breath.   CURRENT MEDICATIONS:  1. Coreg 3.125.  2. Enalapril 10.   PHYSICAL EXAMINATION:  VITAL SIGNS:  Blood pressure today is 112/72 with  a pulse of 70.  LUNGS:  Clear.  HEART:  Sounds are regular.  EXTREMITIES:  Without edema.   IMPRESSION:  1. Atypical chest pain with abnormal Myoview but normal      catheterization.  2. Atrioventricular junction ablation.  3. Status post pacer for the above.   PLAN:  [name] [name] is stable.  We will see her again in 10 months'  time.     [name], MD, [facility]  Electronically Signed    SCK/MedQ  DD: 09/26/2007  DT: 09/27/2007  Job #: [id number]

## 2010-08-23 NOTE — Assessment & Plan Note (Signed)
[facility]                         ELECTROPHYSIOLOGY OFFICE NOTE   NAME:[name], [name]                      MRN:          [id number]  DATE:08/30/2007                            DOB:          Dec 11, 1960    [name] [name] continues to have exertional chest discomfort that is  relieved by backing off a little bit.  Her Myoview scan demonstrates a  perfusion defect in the apical anterior wall that was described as small  and a second small basal inferior defect.  No specific ischemia was  noted.  Given her chest pain history, however, we have discussed  proceeding with catheterization and will schedule this as an outpatient.  I have reviewed with her the potential benefits as well as potential  risks including but not limited to death, perforation and vascular  injury.  She understands these risks and is willing to proceed.     [name], MD, [facility]  Electronically Signed    SCK/MedQ  DD: 08/30/2007  DT: 08/30/2007  Job #: [id number]

## 2010-08-23 NOTE — Cardiovascular Report (Signed)
[facility]                   [place] ELECTROPHYSIOLOGY DEVICE CLINIC NOTE   NAME:[name], [name]                      MRN:          [id number]  DATE:08/01/2007                            DOB:          02/20/1961    [name] is seen in follow-up for AV junction ablation, for  uncontrolled atrial arrhythmias, tachycardia-induced cardiomyopathy with  near normalization of her left ventricular systolic function.  She is  also status post gastric bypass with a loss of about 200 pounds.  There  has been a recent intercurrent weight gain.   A most important note is that she has developed exertional chest  discomfort routinely over the last number of months, when she tries to  walk on her treadmill at what was her standard of 4 miles an hour.  She  can only walk at 3 miles an hour.  When she tries to do it faster, she  develops exertional chest discomfort radiating into both arms,  accompanied by shortness of breath.  There is no accompanying  diaphoresis.  It is relieved by rest.  This is the most exertion that  she does.  She has not noted the discomfort at any other time.   Her last Myoview was done in 2007.  It was read by Dr. Myrtis Ser as having  evidence of some wall motion abnormalities.  I could not find the  perfusion data.  This stands in contrast to a scan done in 2000, where  there was normal perfusion and normal function.   Her cardiac risk factors are notable only for her hypertension and her  previous cardiomyopathy.  I do not have data as to her lipid status that  is less than 5 years old.  At that time, her total cholesterol was 201  with a triglyceride of 113.  No further fractionation was done.   CURRENT MEDICATIONS:  Her current medications include enalapril and  Coreg at 3.125 b.i.d.   ALLERGIES:  SHE HAS NO KNOWN DRUG ALLERGIES.   EXAMINATION:  VITAL SIGNS:  On examination, her weight is 161 pounds.  Her blood pressure is 107/70.   Her pulse was 51.  NECK:  Her neck veins were flat.  Her carotids were brisk.  HEART:  Her heart sounds were regular.  LUNGS:  The lungs were clear.  ABDOMEN:  The abdomen was soft.  EXTREMITIES:  Without peripheral edema.  She is in no acute distress.   Interrogation of her recently changed out Medtronic pacemaker  demonstrates a flutter wave of 1.4 with impedance 45.  No intrinsic  ventricular rhythm is noted.  Her impedance is 684 and threshold 0.5 at  0.4.   IMPRESSION:  1. Atrial arrhythmias status post AV junction ablation.  2. Status post pacer for the above.  3. Largely resolved tachycardia-induced cardiomyopathy with ejection      fraction about 45% by Myoview in 2007, with wall motion      abnormalities and question of perfusion abnormalities.  4. New onset exertional chest discomfort accompanied by shortness of      breath, question angina.   [name] has concerning sounding chest pain, in  the setting of no  known coronary disease but resolved cardiomyopathy.  Myoview scanning is  clearly indicated, and I would unfortunately, given my anticipation that  her scan is going to be abnormal, have a relatively low threshold for  proceeding with catheterization for clarification.   I have reviewed this with her.   We will follow her, see her in 6 months' time for followup of her  device.     [name], MD, [facility]  Electronically Signed    SCK/MedQ  DD: 08/01/2007  DT: 08/01/2007  Job #: [id number]

## 2010-08-23 NOTE — Op Note (Signed)
NAME:  [name] [name], [name] [name]             ACCOUNT NO.:  [id number]   MEDICAL RECORD NO.:  [id number]          PATIENT TYPE:  INP   LOCATION:  2903                         FACILITY:  [facility]   PHYSICIAN:  Doylene Canning. Ladona Ridgel, MD    DATE OF BIRTH:  03/07/1961   DATE OF PROCEDURE:  05/07/2008  DATE OF DISCHARGE:                               OPERATIVE REPORT   PROCEDURE PERFORMED:  Explantation of a dual-chamber pacemaker which was  preceded by insertion of a temporary permanent transvenous pacemaker.   INTRODUCTION:  The patient is a 50 year old woman with a history of  complete heart block created after AV node ablation and pacemaker were  inserted initially in 1995.  The patient subsequently had a pacemaker  generator change 2 years ago, but in the interim had a gastric bypass  surgery.  She presented to the hospital several weeks ago with a tender  swollen pacemaker pocket with erythema and thinning of the skin in the  lateral caudal portion of the pocket.  She was placed on antibiotics and  is now referred for pacemaker system extraction.   PROCEDURE:  After informed consent was obtained, the patient was taken  to the operative room in the fasting state.  The patient was sedated  with general anesthesia under the direction of the anesthesia team.  After usual preparation, the left subclavian vein was punctured and a  new Medtronic model 5076, 52-cm active fixation temporary permanent  pacing lead was advanced into the right ventricle.  The lead was  actively fixed on the RV septum where pacing threshold was less than a  volt of 0.5 milliseconds and the paced R-waves measured 10 mV.  The  impedances were stable.  With these satisfactory parameters, the lead  was secured to the skin with silk suture and the sewing sleeve was also  secured to the skin with silk suture.  Backup pacing was provided  through an analyzer.  At this point, an incision was made in the  infraclavicular region of  approximately 4 cm.  Electrocautery was  utilized to dissect down to the pacemaker leads which were freed up  utilizing electrocautery.  The leads were cut and the distal portion of  the lead and the pulse generator was left in the pocket and attention  was then turned to the remainder of the lead.  A liberator locking  stylet was advanced into each atrial and ventricular pacing lead all the  way down to the tip.  Once the liberator stylet was locked in place, the  Shongopovi evolution (shorty) 11-French sheath was advanced along with the  Teflon outer sheath over the ventricular lead and utilizing a  combination of pressure counter, pressure traction, and counter traction  and the cutting motion of the evolution catheter, the vein was entered  without difficulty.  The atrial lead was also done in the same fashion.  Next, the longer evolution sheath (13-French) was advanced into the vein  and over the atrial lead.  Again, with a combination of traction  counter, traction pressure, and counter pressure and the active cutting  motion of  the lead along with the outer dilator sheath, the ventricular  lead was removed in total.  There were no initial hemodynamic changes  seen.  Having accomplished this, attention was turned to remove the  atrial lead in the same manner.  The evolution sheath was advanced and  the outer sheath we did most of the work rather than the cutting bur on  the inner evolution sheath and again with a combination of pressure  counter, pressure traction, and counter traction, the sheath was  advanced over the lead and removed in total.  At this point, the patient  was observed very carefully and there was no evidence of any hemodynamic  instability.  The pocket was irrigated and electrocautery was utilized  to assure hemostasis, which was a little bit more difficult than usual.  At this point, the old pacemaker and the residual portion of the atrial  and ventricular leads were  removed utilizing gentle traction and  electrocautery.  The pocket was again irrigated.  The old pocket was  debrided as it was heavily calcified and electrocautery was again used  for hemostasis.  The pressure dressing was placed and the incision was  closed with mattress suture.  At this point, a pressure dressing was  placed and the patient was returned to the recovery area where she was  to be extubated.   COMPLICATIONS:  There were no immediate procedure complications.   RESULTS:  This demonstrates successful lead extraction of a 50 year old  atrial and ventricular pacing lead as well as successful removal of the  pacemaker pulse generator along with successful insertion of a temporary  permanent pacemaker.  It will be anticipated that in the next 4-5 days  the patient will have reimplantation of a new system on the right side.      Doylene Canning. Ladona Ridgel, MD  Electronically Signed     GWT/MEDQ  D:  05/07/2008  T:  05/08/2008  Job:  [id number]   cc:   Willow Ora, MD, [facility]

## 2010-08-26 NOTE — Discharge Summary (Signed)
NAME:  [name] [name], [name] [name]             ACCOUNT NO.:  [id number]   MEDICAL RECORD NO.:  [id number]          PATIENT TYPE:  INP   LOCATION:  1503                         FACILITY:  [facility]   PHYSICIAN:  Sandria Bales. Ezzard Standing, M.D.  DATE OF BIRTH:  1960/06/17   DATE OF ADMISSION:  05/10/2006  DATE OF DISCHARGE:  05/15/2006                               DISCHARGE SUMMARY   DISCHARGE DIAGNOSES:  1. Small bowel obstruction secondary to internal hernia.  2. History of AV node ablation with pacemaker.  3. Status post Roux-en-Y gastric bypass with successful weight loss of      approximately 170 pounds.   OPERATIONS:  The patient had an open laparotomy with closure of internal  hernias on May 10, 2006.   HISTORY OF PRESENT ILLNESS:  [name] [name] is a 50 year old white female  who is a patient of Dr. Fara Chute, who underwent a laparoscopic Roux-  en-Y gastric bypass by Dr. Ovidio Kin in October 2005 for morbid  obesity.  Her preoperative weight was approximately 318 pounds.  She has  lost down to 147 pounds for a total weight loss of approximately 170  pounds.  She has done well until the last six weeks prior to admission  when she developed increasing abdominal pain and burping.  This pain  seemed to get worse about two weeks prior to admission and on January  25, Friday evening, she got sick enough to go to [facility] where  a CT scan raised the question of a mesenteric volvulus.  She underwent a laparoscopic exploration by Dr. Jaclynn Guarneri on  Saturday, January 26.  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  She was  discharged home, improved for a couple of days, but then started getting  sick again and then represented on January 30 with abdominal pain.  Dr.  Johna Sheriff had ordered a small bowel series done the day of admission by  Dr. Audie Pinto which showed a high grade obstruction of her proximal  small bowel.   She then presented to the [facility] emergency room where  I evaluated her.   PAST MEDICAL HISTORY:  AV node ablation by Dr. Threasa Alpha at [facility], and has  been followed by Dr. Sherryl Manges locally for her pacemaker.   PHYSICAL EXAMINATION:  VITAL SIGNS:  On admission, temperature 97.  ABDOMEN:  Fullness in left mid abdomen with some mild tenderness.  She  had healed incisions from Dr. Johna Sheriff.  She had no real peritoneal  signs.   LABORATORY DATA:  Hemoglobin of 11.9, hematocrit 36.1, white blood count  6500.  Sodium 141, potassium 4.5, chloride 108, CO2 28, creatinine 0.6.  Liver functions were within normal limits.  Amylase 53, albumin 4.0.   HOSPITAL COURSE:  She was taken to the operating room on the day of  admission where she initially underwent laparoscopic exploration.  I saw  similar findings that Dr. Johna Sheriff had seen, but I was unable to clearly  resolve her jejunal defect and her internal hernias.  I converted to an  open  laparotomy where I found internal hernias at both a Peterson and a  jejunojejunostomy hernia.  Was able to reduce the small bowel out of  these hernias and close both these defects.   Postoperatively, she did well.  She had severe abdominal pain from her  incisions for a couple of days.  Was placed on PCA Dilaudid and given  some Toradol.  By postop day #3, her abdomen was doing better.  She  still had a postop ileus, but she was started on liquids.   She is now five days postop.  She is tolerating liquids.  She has passed  gas.  Her abdomen is soft and she is afebrile, and pain is much better.   She is ready for discharge.   DISCHARGE INSTRUCTIONS:  Resuming her home medications.  She is given  Roxicet elixir for pain. She is to be out of work until June 05, 2006.  I found her FMLA form.  She is to go home on a bariatric diet.  Her activity is really limited only by her discomfort, and she will see  me back in 7-10 days for staple removal.    CONDITION ON DISCHARGE:  Good.      Sandria Bales. Ezzard Standing, M.D.  Electronically Signed     DHN/MEDQ  D:  05/15/2006  T:  05/15/2006  Job:  [id number]   cc:   Fara Chute  Fax: [phone]   [name], MD, [facility]  [address]  [address]  [place]  [place] [address]

## 2010-08-26 NOTE — Discharge Summary (Signed)
NAME:  [name] [name], [name] [name]             ACCOUNT NO.:  [id number]   MEDICAL RECORD NO.:  [id number]          PATIENT TYPE:  INP   LOCATION:  5002                         FACILITY:  [facility]   PHYSICIAN:  Thomas A. Cornett, M.D.DATE OF BIRTH:  11-01-1960   DATE OF ADMISSION:  05/05/2006  DATE OF DISCHARGE:  05/06/2006                               DISCHARGE SUMMARY   ADMITTING DIAGNOSIS:  Abdominal pain, status post roux-en-Y gastric  bypass.   DISCHARGE DIAGNOSIS:  Abdominal pain, status post roux-en-Y gastric  bypass.   PROCEDURES PERFORMED:  Diagnostic laparoscopy, Dr. Johna Sheriff.   BRIEF HISTORY:  The patient is a 50 year old female who is transferred  from [facility] due to abdominal pain after gastric bypass.  When she arrived,  her pain was resolved.  She was seen the next day by Dr. Johna Sheriff, who  was on-call and also a bariatric surgeon, Dr. Ezzard Standing.   HOSPITAL COURSE:  The patient had return of her abdominal pain the next  day.  Dr. Johna Sheriff took her to the operating room, performed a  laparoscopy and lysis of adhesions.  Postoperative course was  unremarkable.  Her pain had resolved and she was discharged on  postoperative day 1 in satisfactory condition with follow up with Dr.  Ezzard Standing who is her bariatric surgeon.   DISCHARGE INSTRUCTIONS:  The patient will follow up with Dr. Ezzard Standing in 1-  2 weeks.  She will resume her preoperative diet.   DISCHARGE MEDICATIONS:  Includes Vicodin 5/500 one to two tabs p.o. q.4  p.r.n.      Thomas A. Cornett, M.D.  Electronically Signed     TAC/MEDQ  D:  07/04/2006  T:  07/04/2006  Job:  [id number]

## 2010-08-26 NOTE — Discharge Summary (Signed)
NAME:  [name] [name], [name] [name]             ACCOUNT NO.:  [id number]   MEDICAL RECORD NO.:  [id number]          PATIENT TYPE:  OIB   LOCATION:  2899                         FACILITY:  [facility]   PHYSICIAN:  [name], MD, FACCDATE OF BIRTH:  11-21-1960   DATE OF ADMISSION:  07/10/2006  DATE OF DISCHARGE:  07/10/2006                               DISCHARGE SUMMARY   She has no known drug allergies.   PRINCIPAL DIAGNOSES:  1. Pacemaker at elective replacement indicator, switched to VVI mode.  2. Generator change July 10, 2006, with implantation of Medtronic      ADAPTA dual-chamber pacemaker, also lead repaired to both right      ventricular and right atrial leads.  3. Upon pacemaker interrogation, the patient has an intrinsic rhythm      so no temporary pacemaker will be needed.  4. QUESTION OF INITIATING COREG.  5. Echocardiogram July 06, 2006, ejection fraction 45-50%, left      atrial enlargement, moderate mitral regurgitation, small ASD, mild      tricuspid regurgitation.   SECONDARY DIAGNOSES:  1. History of uncontrolled atrial arrhythmias.  2. Catheterization December 28, 1990, ejection fraction 32% with      normal coronary anatomy.  3. Echocardiogram July 06, 2006, ejection fraction 45-50%.  4. Status post AV ablation [facility], pacemaker      implanted November 26, 1993.  5. Status post Roux-en Y gastric bypass October 2005, weight loss of      318 to 147 pounds.  6. Status post lysis of adhesions May 05, 2006.  7. Small-bowel obstruction status post open closure of internal      hernias (Pierces defect and jejunal hernia, May 10, 2006.   PROCEDURE:  Explant of existing Medtronic Thera pacemaker with  implantation of Medtronic ADAPTA with right ventricular, right atrial  lead repair, Dr. Sherryl Manges.   BRIEF HISTORY:  [name] [name] is a 50 year old female.  She is status  post AV ablation at [facility] in 1995.  She had a pacemaker  subsequently implanted.  The past few months she has had progressive  deterioration in functional status.  In part, this relates to two  surgeries relating to her gastric bypass which was done in October 2005.  Both these surgeries were in January 2008.  She has not quite recovered  from them yet.   The patient has also reverted to VVI at 65 beats per minute at the time  of interrogation at office visit July 04, 2006.  The patient will have  an echocardiogram to make sure that her left ventricular function  remains in the normal range.  If she has deterioration, we will think  about CRT implantation.  It would also be appropriate to consider Coreg  introduction given her history of tachycardia induced cardiomyopathy.  Echocardiogram will be scheduled this week on July 06, 2006, and the  device generator change for July 10, 2006.  Potential risks,  particularly in regarding infection, have been discussed with the  patient, he wished to proceed.   HOSPITAL COURSE:  The patient presented electively July 10, 2006.  She  underwent explantation of her existing device with implantation of  Medtronic ADAPTA dual-chamber pacemaker, both right atrial and right  ventricular lead were repaired.  The patient has had no postprocedural  complications, discharging the same day.  She is asked to remove her  bandage in the morning of July 11, 2006.  She is to keep her incision  dry for next 7 days, to sponge bathe until Tuesday July 17, 2006.   MEDICATION:  Vasotec 10 mg daily.   She follows up [facility] Friday, July 27, 2006, at  4 o'clock.  At that time, Coreg initiation will be considered by Dr.  Graciela Husbands   LABORATORY DATA:  Laboratory studies pertinent to this admission were  taken July 04, 2006.  Complete blood count:  White cells of 5.5,  hemoglobin 10.7, hematocrit 32.2, platelets 206.  Protimes 12.8, INR  1.1.  Sodium was 143, potassium 3.6, chloride 109, carbonate  27, glucose  93, BUN is 12, creatinine 0.6.   Greater than 25 minutes.      Maple Mirza, [place]      [name], MD, [facility]  Electronically Signed    GM/MEDQ  D:  07/10/2006  T:  07/10/2006  Job:  [id number]   cc:   Fara Chute

## 2010-08-26 NOTE — Op Note (Signed)
NAME:  [name] [name], [name] [name]             ACCOUNT NO.:  [id number]   MEDICAL RECORD NO.:  [id number]          PATIENT TYPE:  INP   LOCATION:  0165                         FACILITY:  [facility]   PHYSICIAN:  Sharlet Salina T. Hoxworth, M.D.DATE OF BIRTH:  11-Dec-1960   DATE OF PROCEDURE:  01/19/2004  DATE OF DISCHARGE:                                 OPERATIVE REPORT   PROCEDURE:  Upper gastrointestinal endoscopy.   DESCRIPTION:  Ms. Cosman is endoscoped at the completion of laparoscopic  Roux-en-Y gastric bypass by Dr. Ezzard Standing.  The Olympus video endoscope was  passed into the esophagus and advanced under direct vision to the EG  junction at 47 cm.  The small gastric pouch was entered and distended with  air.  There was no evidence of leak.  Examined the pouch under water  laparoscopically.  Staple and suture lines appeared intact and without  bleeding.  The anastomosis was patent.  The pouch was measured and was  between 4-5 cm in length.  Following this, all air was suctioned, the pouch  decompressed, and the endoscope withdrawn.      BTH/MEDQ  D:  01/19/2004  T:  01/19/2004  Job:  [id number]

## 2010-08-26 NOTE — Cardiovascular Report (Signed)
NAME:  [name] [name], [name] [name]             ACCOUNT NO.:  [id number]   MEDICAL RECORD NO.:  [id number]          PATIENT TYPE:  OIB   LOCATION:  1963                         FACILITY:  [facility]   PHYSICIAN:  Bruce R. Juanda Chance, MD, FACCDATE OF BIRTH:  [birth year]   DATE OF PROCEDURE:  10/01/2007  DATE OF DISCHARGE:  09/11/2007                            CARDIAC CATHETERIZATION   CANCELLED DICTATION.      Bruce Elvera Lennox Juanda Chance, MD, [facility]  Electronically Signed     BRB/MEDQ  D:  10/01/2007  T:  10/02/2007  Job:  [id number]

## 2010-08-26 NOTE — Op Note (Signed)
NAME:  [name] [name], [name] [name]             ACCOUNT NO.:  [id number]   MEDICAL RECORD NO.:  [id number]          PATIENT TYPE:  INP   LOCATION:  0165                         [facility]ACILITY:  [facility]   PHYSICIAN:  Sandria Bales. Ezzard Standing, M.D.  DATE OF BIRTH:  02-Apr-1961   DATE OF PROCEDURE:  01/19/2004  DATE OF DISCHARGE:                                 OPERATIVE REPORT   PREOPERATIVE DIAGNOSIS:  Morbid obesity with body mass index of  approximately 50 and weight of 299.   POSTOPERATIVE DIAGNOSIS:  Morbid obesity with body mass index of  approximately 50 and weight of approximately 299 and some adhesions to her  anterior abdominal wall and a prior tubal ligation.   PROCEDURE:  Laparoscopic Roux-en-Y gastrojejunostomy (antecolic,  antegastric).   SURGEON:  Dr. Ezzard Standing   FIRST ASSISTANT:  Dr. Jaclynn Guarneri   ANESTHESIA:  General endotracheal.   ESTIMATED BLOOD LOSS:  Minimal.   INDICATION FOR PROCEDURE:  [name] [name] is a 50 year old, white female, who  is a patient of Dr. Fara Chute in [place], who has been morbidly obese much of  her adult life.  She has been through our bariatric program and now comes  for attempted laparoscopic Roux-en-Y gastrojejunostomy.  The indications and  potential complications have been explained to the patient.  Potential  complications include but are not limited to bleeding, infection, bile leak,  deep venous thrombosis, long-term nutritional consequences, bowel  obstruction, and the need for possible open surgery.    Operative Note:   She has completed a bowel prep before her surgery.  She is given heparin  subcu preop, and she is given Mefoxin IV preop.  Her abdomen is prepped with  Betadine solution.  She has a Foley catheter in place, and she underwent a  general anesthesia supervised by Dr. Richardean Chimera.  Her abdomen is prepped  with Betadine solution and sterilely draped.  I accessed the abdominal  cavity through an OptiView through the left upper quadrant and  entered into  the abdominal cavity without difficulty.  I insufflated the abdomen, and  this was with a 12 mm trocar.  I then placed a 10 mm trocar to the right of  the umbilicus.  I placed two 12 mm trocars, one to the right paramedian, one  to the left paramedian, and one right subcostal 12 mm trocar, and then a 5  mm trocar lateral left subcostal trocar.   She did have some adhesions to the anterior abdominal wall from which she  had had a prior tubal ligation.  These were taken down with harmonic  scalpel.  Attention was then turned to abdominal exploration.  The right and  left lobes of the liver were unremarkable.  The anterior wall of the stomach  was unremarkable.  There was no other mass or lesion.  She did have a  generous amount of omentum.  The ligament of Treitz was identified.  I went  about 40 cm beyond the ligament of Treitz because this seemed to be where  the mesentery reached up to the liver and stomach the best.  I then divided  it with a 45 white staple.  I divided the mesentery.  I then counted 100 cm  down, freed the gastric limb, and then did a side-to-side jejunojejunostomy  between the biliary limb and the distal small bowel.  I also tagged the  small bowel with a Penrose drain.   The jejunojejunostomy was carried out with a stapled 45 mm EndoGIA stapler,  a white load.  The enterotomies were closed with two running #2-0 Vicryl  sutures.  The mesentery was closed with a 2-0 silk suture and Tisseal placed  over the wound.   I then turned my attention to the stomach pouch.  First I went over the  angle of His and developed this angle first.  Then went along the lesser  curvature about 5 cm and try to make a pouch about 5 cm long, about 3 cm in  diameter.  I developed a pocket behind the lesser curvature.  I identified  the lesser sac and fired a 45 GIA stapler transversely across the stomach.  Then I used the 60 mm Ethicon stapler in three firings to create the  lateral  wall, trying to exclude the greater curvature.   At this point, the pouch was, again, about 5 cm in length and about 3-4 cm  in width.  I did have a little tongue of the gastric remnant that I had  removed with a 45 stapler.  I then oversewed the gastric pouch with a  running 2-0 Vicryl suture along the greater curvature.  There was a small  bleeding vessel which required two sutures of 2-0 Vicryl suture to oversew  this area.   The abdomen was then irrigated with saline.  I placed Tisseal up along the  gastric pouch and the inside of the gastric remnant.  I then brought up the  jejunal limb anteriorly, antegastric, antecolic.  I sewed a posterior wall  of 2-0 Vicryl sutures to the gastric remnant.  I then carried out a  gastrojejunostomy using the 45 EndoGIA stapler.  I closed the enterotomy  with two running #1 Vicryl sutures.  I had to put an additional buttress  suture in the middle where it looked like there was a small gap, and then I  did an anterior running 2-0 Vicryl suture.   Prior to the anterior running suture, I passed the Ewald tube down the  anastomosis and passed without difficulty.   The Ewald tube is then removed.  Dr. Johna Sheriff broke scrub and went to the  head of the bed where he did an upper endoscopy, identifying the anastomosis  about 44-45 cm, the GE junction about 40 cm for about a 5 cm pouch.  I  clamped off the small bowel by flow to the upper abdomen.  There were no air  leaks.  There was no evidence of any leak.  There was no bleeding from his  endoscopy.  There is no evidence of any ischemia of the bowel, and he will  dictate this portion of the operation.   I then irrigated out the upper abdomen.  Again, I had flooded this under  saline.  I then aspirated the saline out, placed Tisseal over the anterior  anastomosis.  I placed a Blake drain through her left lateral quadrant incision and sewed this in placed with a 3-0 nylon suture.  I then  removed  the [facility] retractor which was placed in the subxiphoid location through  a 5 mm hole.  I removed this.  I then looked at each trocar as it was  removed.  There was no bleeding at any trocar site.  I then stapled each  trocar wound with skin staples.  I made sure each one was infiltrated with  about 5 mL of 0.25% Marcaine.   The patient tolerated the procedure well.  Again, her estimated blood loss  was fairly minimal.  The patient was transferred to the recovery room in  good condition.   Again, she had had an AV ablation and had a pacemaker in place, so I spoke  to [facility] as a postoperative consult, and I will keep her in the  step-down ICU overnight for observation.     Pervis Hocking   DHN/MEDQ  D:  01/19/2004  T:  01/19/2004  Job:  [id number]   cc:   Fara Chute  [address]  [place]  [place] [address]  Fax: [phone]   Jonelle Sidle, M.D. [facility]

## 2010-08-26 NOTE — Assessment & Plan Note (Signed)
[facility]                         ELECTROPHYSIOLOGY OFFICE NOTE   NAME:[name] [name], [name] [name]                      MRN:          [id number]  DATE:07/04/2006                            DOB:          1960-12-27    Ms. Jablonski comes in.  She is status post AV ablation done at [facility] with  pacemaker implantation for uncontrolled atrial arrhythmias.  Over the  last few months, she has had progressive deterioration in her functional  status.  This relates, in part, to her having developed adhesions  following her gastric bypass surgery and requiring surgery for that in  January.  She said she never quite recovered.   However, we also note that she has reverted to VVI at 65 as she reached  ERI in February.   It has been some time since she has had an echo.   CURRENT MEDICATIONS:  Include Vasotec 10.  Notably, she is not on a beta  blocker.   She also has a resolved tachycardia-induced cardiomyopathy.   EXAMINATION:  Her blood pressure is 132/77, her pulse is 64, weight was  147, down from a peak of 268.  Lungs were clear.  Heart sounds were  regular and the extremities were without edema.   Interrogation of her Medtronic Thera pulse generator demonstrates that  she has a) an intrinsic rhythm, b) that she has reverted to VVI at 65.   IMPRESSION:  1. Atrial arrhythmias with an uncontrolled ventricular response, not      ablatable at [facility], status post AV ablation.  2. Status post pacer for the above.  3. Status post gastric bypass.  4. Exercise intolerance, question related to reversion, question      related to LV dysfunction with her hip.  5. History of resolved tachycardia-induced cardiomyopathy.   We will plan to check an echo to make sure her LV function remains in  the normal range.  In the event that she has had deterioration, we will  think about CRT implantation.   It would also be appropriate to consider Coreg introduction, given her  history  of tachycardia-induced cardiomyopathy.   We will schedule her for an echo this week and device generator  replacement next week.  I have reviewed with her the potential issues,  particularly related to infection.  She understands these risks and is  willing to proceed.   As she is not device-dependent, a temporary transvenous pacer will not  be necessary.     [name], MD, [facility]  Electronically Signed    SCK/MedQ  DD: 07/04/2006  DT: 07/04/2006  Job #: [id number]   cc:   Fara Chute  [facility]

## 2010-08-26 NOTE — Op Note (Signed)
NAME:  JANAISA, BIRKLAND             ACCOUNT NO.:  [id number]   MEDICAL RECORD NO.:  [id number]          PATIENT TYPE:  OIB   LOCATION:  2899                         FACILITY:  [facility]   PHYSICIAN:  [name], MD, FACCDATE OF BIRTH:  04-06-1961   DATE OF PROCEDURE:  07/10/2006  DATE OF DISCHARGE:  07/10/2006                               OPERATIVE REPORT   PREOPERATIVE DIAGNOSES:  Complete heart block, status post  atrioventricular ablation, previously implanted pacemaker now at end of  life.   POSTOPERATIVE DIAGNOSES:  Complete heart block, status post  atrioventricular ablation, previously implanted pacemaker now at end of  life.   PROCEDURE:  Explantation of a previously implanted pacemaker and repair  of 2 leads.   DESCRIPTION OF PROCEDURE:  Following the obtaining of informed consent,  the patient was brought to the electrophysiology laboratory and placed  on the fluoroscopic table in the supine position.  After routine prep  and drape, lidocaine was infiltrated along the line of the previous  incision and carried down to the layer of the device pocket.  Using  sharp dissection and electrocautery, the pocket was opened and the  device was explored.  The atrial lead was first assessed.  It was  Medtronic 5524, serial [id number].  The P wave was 3 with an impedance  of 432 and a threshold that was not assessed because of the underlying  atrial arrhythmia.   The previously implanted ventricular lead was a 5024, 58-cm, serial  [id number].  The bipolar junctional escape beat had an amplitude of 10  with an impedance of 541, a threshold of 0.3 at 0.5, with a current at  threshold of 1.0 mA.  At this point, it was noted that there were 2  insulation bubbles.  There was some discoloration of the atrial lead.  It is not clear at which point these bubbles emerged, but it was elected  at this point to repair both leads.  That was then done, and 10 minutes  were awaited to allow  for them to dry.  During this period, we excavated  the floor of the pocket.  Hemostasis was obtained, and the leads were  then attached to a Medtronic Adapta RL1 pulse generator, serial  #[id number].  Ventricular pacing and then tracking were noted.  The  pocket was copiously irrigated with antibiotic-containing saline  solution.  Hemostasis was assured, and the leads and the pulse generator  were placed in the pocket, and I then secured the leads to the  prepectoral fascia.  I should also note that the ties to the ventricular  lead were not attached to underlying tissue, and that the lead had  evidently retracted a little bit and was now fixed in its new position.  The ventricular lead, I should note, was tied down again, and then the  wound was closed in 3 layers in the normal fashion.  The wound was  washed, dried, and a benzoin and Steri-Strips dressing was applied.  Needle counts, sponge counts and instrument counts were correct at the  end of the procedure according to the staff.  The  patient tolerated the  procedure without apparent complication.      [name], MD, [facility]  Electronically Signed     SCK/MEDQ  D:  07/10/2006  T:  07/10/2006  Job:  [id number]

## 2010-08-26 NOTE — Op Note (Signed)
NAME:  [name], [name]             ACCOUNT NO.:  [id number]   MEDICAL RECORD NO.:  [id number]          PATIENT TYPE:  INP   LOCATION:  5002                         FACILITY:  [facility]   PHYSICIAN:  Sharlet Salina T. Hoxworth, M.D.DATE OF BIRTH:  [birth year]   DATE OF PROCEDURE:  05/05/2006  DATE OF DISCHARGE:                               OPERATIVE REPORT   PRE-AND-POSTOPERATIVE DIAGNOSIS:  Abdominal pain status post  laparoscopic Roux-en-Y gastric bypass.   SURGICAL PROCEDURES:  Laparoscopy and lysis of adhesions.   SURGEON:  Lorne Skeens. Hoxworth, M.D.   ASSISTANT:  Alfonse Ras, MD   ANESTHESIA:  General.   BRIEF HISTORY:  [name] [name] is a 50 year old female who is status  post laparoscopic Roux-en-Y gastric bypass in October of 2005 by Dr.  Ovidio Kin.  She has done very well, postoperatively, with no  abdominal pain and GI difficulty and excellent weight loss of 120  pounds.  However, in the last few weeks she has had some intermittent  gas and pressure-like discomfort in her left upper quadrant; and then  was admitted to hospital last night with severe pressure-like epigastric  and left upper quadrant pain.  The pain subsided about the time she was  admitted, but recurred, again, this morning.  She has some tenderness in  the epigastrium.  X-rays initially at [facility], prior  to her admission, had indicated evidence of a bowel obstruction.  I have  recommended proceeding with laparoscopy due to the possibility of  internal hernia and bowel obstruction.  The nature of the procedure,  indications, risks of bleeding, infection, bowel injury, and possibility  for an open procedure were discussed with the patient preoperatively;  and she is now brought to the operating room for this procedure.   DESCRIPTION OF OPERATION:  The patient was brought to the operating room  and placed in the supine position on the operating table and general  endotracheal  anesthesia was induced.  The abdomen was widely sterilely  prepped and draped.  She received IV antibiotics.  Local anesthesia was  used to infiltrate the trocar sites.  A 1 cm incision was made at the  umbilicus; and dissection carried down to the midline fascia which was  sharply incised for 1 cm; and the peritoneum entered under direct  vision.  Through a mattress suture of #0 Vicryl the Hasson trocar was  inserted and pneumoperitoneum established.   Two 5-mm trocars were placed in the right upper quadrant.  Then the 5-mm  trocar was placed in the left upper quadrant.  There were noted to be  some dilated loops of bowel in the left midabdomen.  There was no fluid  in the abdomen and certainly no evidence of ischemic bowel.  There were  some adhesions of omentum and the Roux limb to the anterior abdominal  wall that were taken down with careful scissor dissection to allow  easier exposure.  The entire bowel was then run several times to  delineate the anatomy.  The jejunojejunostomy was clearly identified.  The common channel distally was run and clearly identified.  The entire  Roux limb and afferent limb were identified.  Significant findings  included a definite twist of the mesentery at the jejunojejunostomy.  As  we attempted to discern the cause of this, it was apparent there were  some adhesions from near the jejunojejunostomy anastomosis up to the end  of the right facing candy cane of the gastrojejunostomy.  As these  adhesions were lysed, this did allow the jejunojejunostomy to untwist;  and the mesentery appeared straight.  There was also a single band-like  adhesion that ran across a portion of the dilated small bowel underneath  the jejunojejunostomy; this was lysed which freed this up.   There was definitely a large defect behind the jejunojejunostomy.  There  was bowel behind this which as we delineated all limbs was clearly the  Roux limb.  However, when we brought the  Roux limb up into the left  upper quadrant, typically its normal anatomic position and followed it  down to the jejunojejunostomy; as we attempted to bring the entire limb  up under the jejunojejunostomy in the left upper quadrant, this  definitely did produce a twist at the jejunojejunostomy; and the bowel  seemed to lie much more naturally with an untwisted jejunojejunostomy  with a good portion of the Roux limb lying beneath this anastomosis down  toward the right lower quadrant.  The defect underneath the jejunal  anastomosis was very broad; and certainly not constricting in any way of  the bowel which was able to slide easily underneath it.  At this point  we felt that likely dividing the adhesions over the bowel that had  caused the twisting; had likely relieved her obstruction, and that the  defect was so broad and that bringing the Roux limb up to the patient's  left side in order to close the defect would tend to cause more twisting  and potential obstruction than leaving the defect wide open.   The bowel was again run; and there was no injury, no constriction, or  blockage at this point.  The trocars were then removed under direct  vision and all CO2 evacuated.  The mattress sutures were secured at the  umbilicus.  Skin incisions were closed with interrupted subcuticular  Monocryl and Steri-Strips.  Sponge, needle, and instrument counts were  correct.  Dry dressings were applied; and the patient taken recovery in  good condition.      Lorne Skeens. Hoxworth, M.D.  Electronically Signed     BTH/MEDQ  D:  05/05/2006  T:  05/05/2006  Job:  [id number]

## 2010-08-26 NOTE — H&P (Signed)
NAME:  [name] [name], [name] [name]             ACCOUNT NO.:  [id number]   MEDICAL RECORD NO.:  [id number]          PATIENT TYPE:  INP   LOCATION:  0098                         [facility]ACILITY[facility]  [facility]   PHYSICIAN:  Sandria Bales. Ezzard Standing, M.D.  DATE OF BIRTH:  Oct 25, 1960   DATE OF ADMISSION:  05/10/2006  DATE OF DISCHARGE:                              HISTORY & PHYSICAL   HISTORY OF ILLNESS:  [name] [name] is a 50 year old white female who sees  Dr. Fara Chute as her primary medical doctor, underwent a laparoscopic  Roux-En-Y gastric bypass by Dr. Ovidio Kin in October 2005 for morbid  obesity.  She has done very well from her bariatric surgery with her  initial weight being approximately 318.  She has now lost down to 147  pounds for a total weight loss of 170 pounds.  She has done well until  the last six weeks or so when she developed vague abdominal pain and  burping.  About two weeks ago, the pain seemed to get worse and then  this past Friday evening, May 04, 2006, the pain became bad enough  for her to go to [facility] where she had a CAT scan in which  they wondered whether there was a mesenteric volvulus or twist and she  was referred to [facility].  I do not have access to those  xrays.   She had a laparoscopic exploration by Dr. Jaclynn Guarneri on Saturday,  January 26.  He found some adhesions which he thought may be causing the  blockage and he did an enterolysis of these though she had a difficult-  to-resolve mesenteric defect and twist in her bowel.  This is at the [facility].   She was discharged home where she was well for a couple of days but then  Tuesday, had severe abdominal pain, then on Wednesday which was  yesterday, January 30, had severe abdominal pain.  Dr. Johna Sheriff ordered  a small bowel series which Dr. Audie Pinto called me this afternoon and  she has contrast hanging up in her small bowel proximal to the  jejunojejunostomy at three hours.  She appears to have a  persistent  partial bowel obstruction with the focus of the obstruction at the  jejunojejunostomy.   ALLERGIES:  She has no allergies.   CURRENT MEDICATIONS:  She is not on any medications.  She actually eats  without difficulty from her small pouch.  She does take some Tums daily.   REVIEW OF SYMPTOMS:  CARDIAC:  She had an ablation of an AV node by Dr.  Juluis Pitch in [facility] and is followed by Dr. Graciela Husbands with a pacemaker after this  AV node ablation.  PULMONARY:  No history of pneumonia or tuberculosis.  GASTROINTESTINAL:  See history of present illness.  UROLOGIC:  No history of kidney stones or kidney infections.   She is accompanied by [name] [name], her husband.   PHYSICAL EXAMINATION:  VITAL SIGNS:  Temperature 97, blood pressure 111/77, pulse 71,  respirations 16.  HEENT:  Unremarkable.  NECK:  Supple, no masses, no thyromegaly.  LUNGS:  Clear to auscultation.  HEART:  Regular rate and rhythm, I hear no murmur or rub.  She has a  pacemaker in the right upper chest.  ABDOMEN:  She has a fullness on the left of midline with a little bit of  a tender mass like effect to the left of her umbilicus.  She has well  healed incisions from the recent incision Dr. Johna Sheriff did.  She has no  palpable mass.  I could feel no other organomegaly, hernia or mass.   I have discussed and reviewed her x-rays that she had done at [facility] today  and the small bowel series which shows dilatation and high grade  obstruction of her small bowel, probably down to her JJ, with barium  still there afer three hours.  I reviewed this with Dr. Audie Pinto.  The  barium will preclude any effective CT scan.  Her white blood count is  normal, I do not have her other labs at the time of this dictation.   IMPRESSION:  1. Ms. Sutphin is having persistent partial bowel obstruction      secondary to either some post surgery anatomy or scar tissue.  I      think she warrants another laparoscopic evaluation.  I cannot see       how doing a CT scan or giving her time will change that      recommendation.  I have discussed with her and her husband the      indications and potential complications including but not limited      to bleeding, infection, bowel injury.  I also talked about the possibility of doing an open surgery and  resecting the bowel or at least resolving what is causing this  obstruction.  I think they understand all this very well.  1. AV node ablation with pacemaker, stable with stable cardiac      function. Followed by Dr. Sherryl Manges.  2. Status post Roux-En-Y gastric bypass with significant weight loss.      Sandria Bales. Ezzard Standing, M.D.  Electronically Signed     DHN/MEDQ  D:  05/10/2006  T:  05/10/2006  Job:  [id number]   cc:   Fara Chute, M.D.   [name], MD, [facility]  [address]  [address]  [place]  [place] [address]

## 2010-08-26 NOTE — H&P (Signed)
NAME:  [name] [name], [name] [name]             ACCOUNT NO.:  [id number]   MEDICAL RECORD NO.:  [id number]          PATIENT TYPE:  INP   LOCATION:  2114                         FACILITY:  [facility]   PHYSICIAN:  Maisie Fus A. Cornett, M.D.DATE OF BIRTH:  02-Jul-1960   DATE OF ADMISSION:  05/05/2006  DATE OF DISCHARGE:                              HISTORY & PHYSICAL   CHIEF COMPLAINT:  Abdominal pain.   HISTORY OF PRESENT ILLNESS:  The patient is a 50 year old female three  years status moves Roux-en-Y laparoscopic gastric bypass Dr. Ovidio Kin.  She has lost well over 120 pounds.  At about 6 p.m. tonight,  she developed sudden crampy epigastric abdominal pain associated with  nausea and vomiting.  This was associated with eating as well.  The pain  was quite severe on a scale 5 to 10/10 and she sought care at [facility].  Workup revealed it was felt to be a closed loop  obstruction.  I talked with the emergency room doctor there and  recommended transfer to [facility] for further evaluation and potential  treatment of closed loop obstruction secondary to her gastric bypass and  possible internal hernia.  She was brought by [facility] and was  hemodynamically stable during transport.  The patient does not complain  of any pain.  Her pain is completely gone she states.  She has no more  nausea and vomiting and feels back to normal.  She has received pain  medicine with the last dose was a couple of hours while reviewing her  transfer records.  There was no radiation of pain at this point in time  and currently she is painfree without complaint.   PAST MEDICAL HISTORY:  1. History of morbid obesity, status post Roux-en-Y laparoscopic      gastric bypass back in 2005 by Dr. Sandria Bales. [place].  2. History of pacemaker.  3. History of oblation secondary supraventricular tachycardia.   PAST SURGICAL HISTORY:  Please see above.   MEDICATIONS:  Include calcium carbonate and Benadryl.   FAMILY  HISTORY:  Is noncontributory.   ALLERGIES:  No known drug allergies.   REVIEW OF SYSTEMS:  As stated above, otherwise 15-point review of  systems is negative.   SOCIAL HISTORY:  Negative tobacco or alcohol use.  She is married.   PHYSICAL EXAM:  Temperature 97, blood pressure 96/60, heart rate 90.  HEENT:  Extraocular movements are intact.  No scleral icterus.  Oropharynx moist.  NECK:  Supple, nontender.  Trachea midline.  No JVD.  CHEST:  Chest wall motion normal.  PULMONARY:  Lung sounds are clear to auscultation.  CARDIOVASCULAR:  Regular rate and rhythm without rub, murmur or gallop.  ABDOMEN:  Soft, nontender.  No rebound, no guarding, no signs of  peritonitis.  No abdominal wall hernia.  No mass.  EXTREMITIES:  No clubbing, cyanosis nor edema.  Muscle tone is normal.  Range of motion is normal.  NEUROLOGICAL EXAMINATION:  Motor and sensory function are grossly  intact.  She has a Glasgow coma score of 15.   DIAGNOSTIC STUDIES:  I reviewed her  CD from [place].  The study is not  the best quality.  There is some small bowel dilatation.  There is area  throughout the small bowel intestinal track.  No free fluid or ascites.  The patient has a white count 6600.  Her hemoglobin is 12, hematocrit  35, platelet count 222,000.  Urine pregnancy test is negative.  Electrolytes show BUN 9, creatinine 0.7.  Potassium 3.5.  Sodium 135.  Chloride 103.  CO2 26.  Amylase 103, lipase 33.  Liver function are  normal.   IMPRESSION:  Abdominal pain now resolved with CT findings of question  closed obstruction.   PLAN:  At this point in time, given her examination, I will have  question this process result.  I also question if she had truly had a  closed loop obstruction at this point in time.  I think an upper GI  would be the next step to further define her anatomy at this point in  time and observe her in the intensive care unit for the next 6 hours or  so to see if her pain gets worse.   If she has recurrence of her  abdominal pain, she will require laparoscopy, possible laparotomy for a  possibility of internal hernia but at this point in time, with a benign  abdominal examination and the patient's pain completely gone, I have to  wonder if this process has resolved.  This can be worked up in a semi-  emergent setting.  The patient and husband understand this and we will  keep her in the ICU for right now, keep her n.p.o. and limit her pain  medicine for the next 3-4 hours.      Thomas A. Cornett, M.D.  Electronically Signed     TAC/MEDQ  D:  05/05/2006  T:  05/05/2006  Job:  [id number]

## 2010-08-26 NOTE — Op Note (Signed)
NAME:  [name] [name], [name] [name]             ACCOUNT NO.:  [id number]   MEDICAL RECORD NO.:  [id number]          PATIENT TYPE:  INP   LOCATION:  0098                         FACILITY:  [facility]   PHYSICIAN:  Sandria Bales. Ezzard Standing, M.D.  DATE OF BIRTH:  May 02, 1960   DATE OF PROCEDURE:  05/10/2006  DATE OF DISCHARGE:                               OPERATIVE REPORT   PREOPERATIVE DIAGNOSIS:  Small bowel obstruction.   POSTOPERATIVE DIAGNOSIS:  Small bowel obstruction secondary to two  internal hernias (Pierce's defect and jejunojejunal hernia).   PROCEDURE:  Laparoscopic, converted to open closure of internal hernias  x2.   SURGEON:  Sandria Bales. Ezzard Standing, M.D.   FIRST ASSISTANTS:  Lorne Skeens. Hoxworth, M.D., Alfonse Ras, MD   ANESTHESIA:  General endotracheal.   ESTIMATED BLOOD LOSS:  Minimal.   INDICATIONS FOR PROCEDURE:  Ms. Syme is a 50 year old white female  who is a patient of Dr. Jim Desanctis in [place], who underwent a Roux-en-Y  gastric bypass in October, 2005.  She successfully lost from 318, down  to about 147 pounds; however, the last six weeks, she has had increasing  vague abdominal pain.  Last Friday, the 25th of January, she presented  acutely to [facility].  She underwent a CT scan and then was  referred down to [facility] and evaluated by Dr. Johna Sheriff on  Saturday, the 26th of January, 2008.  She was taken to the operating  room, where they identified an adhesive band that they reduced.  They  also found a large hernia at the jejunojejunostomy, which was left  unrepaired.  The patient has had continued symptoms this week.  She had  a small bowel series done at [facility] today, which showed again a small bowel  obstruction.  She has come to the hospital for me to evaluate.   I think she has had a recurrent small bowel obstruction whose etiology  remains clear, and the plan is to laparoscopically evaluate her.  I  spoke with her and her husband about the possibility of  laparoscopic and  open surgery and the possibility of bowel resection and the risks, which  include bleeding, infection, bowel injury, and open surgery.   OPERATIVE NOTE:  Patient placed in supine position.  Given 1 gm of  cefoxitin IV as an antibiotic.  She PAS stockings in place, a Foley  catheter placed.  Her abdomen was prepped with Betadine solution and  sterilely draped.  I went through an infraumbilical incision, which Dr.  Johna Sheriff had just done five days ago.  Cut into the abdominal cavity.  I  put three additional trocars in her right lower quadrant.  I spent about  45 minutes to an hour running the small bowel from the ligament of  Treitz back up to the JJ, and from the gastric anastomosis down to the  JJ anastomosis and to the small bowel.   Despite multiple attempts, I never really could totally untwist the  bowel using a laparoscope, and there was a concern whether she had an  internal hernia or some twist originally in her anastomosis.  Dr.  Johna Sheriff and Dr. Colin Benton, who had actually done the case on Saturday, also  came in.  They had already spent two hours trying to unravel this puzzle  on Saturday, January 26.   At this point, I thought she was best served by doing an open  laparotomy, so I extended my umbilical incision up towards the xiphoid  about 8 cm and got into the abdominal cavity.  I then was able to bring  all of her small bowel out.  I ran gastric limb of jejunum up to the  stomach.  I ran the small bowel down to her terminal ileum and ran the  biliary limb of her jejunum.  What she had was two internal hernias, one  was a Peterson's defect where the small bowel goes over the transverse  colon, and the larger hernia at her JJ.  There was no malrotation of the  bowel or anastomosis.  The problem was two hernias, the bowel had looped  through two hernias, and I was never able to uncoil this through the  laparoscope.   After orienting the bowel, I saw where  there had been an adhesive band  that Dr. Johna Sheriff had divided at the prior operation, but for some  reason, the patient continued to have trouble with bowel obstruction.  I  closed the Peterson's defect with interrupted 2-0 silk suture.  I closed  this jejunojejunal defect with an interrupted 2-0 silk suture.  I then  irrigated the abdomen with 2 liters of saline.  There was no obvious  bowel injury.  I did see an area of chronic dilitation of small bowel  consistent with the small bowel  obstruction.  The  jejunojejunal  anastomosis was widely patent.  The gastric pouch looked good.  There  was a right facing gastrojejunostomy candy cane which looked okay.   After irrigating it with 2 liters of saline, the abdomen was closed with  two running #1 PDS sutures.  The skin was closed with a skin gun.  The  patient tolerated the procedure well.  The sponge and needle count were  not correct.  There was a missing kelly clamp.  A KUB of the abdomen was  done after the abdomen was closed and it showed no retained insturment.  The remainder of her count was okay.   Will be kept in the hospital until obviously her bowels work and are  doing well.      Sandria Bales. Ezzard Standing, M.D.  Electronically Signed     DHN/MEDQ  D:  05/10/2006  T:  05/10/2006  Job:  [id number]   cc:   Fara Chute  Fax: [phone]   [name], MD, [facility]  [address]  [address]  [place]  [place] [address]

## 2010-09-08 ENCOUNTER — Encounter: Payer: PRIVATE HEALTH INSURANCE | Admitting: *Deleted

## 2010-09-10 ENCOUNTER — Encounter: Payer: Self-pay | Admitting: *Deleted

## 2011-02-21 ENCOUNTER — Encounter: Payer: Self-pay | Admitting: Internal Medicine

## 2011-02-23 ENCOUNTER — Encounter: Payer: Self-pay | Admitting: Internal Medicine

## 2011-02-23 ENCOUNTER — Ambulatory Visit (INDEPENDENT_AMBULATORY_CARE_PROVIDER_SITE_OTHER): Payer: PRIVATE HEALTH INSURANCE | Admitting: Internal Medicine

## 2011-02-23 DIAGNOSIS — I4891 Unspecified atrial fibrillation: Secondary | ICD-10-CM

## 2011-02-23 DIAGNOSIS — Z950 Presence of cardiac pacemaker: Secondary | ICD-10-CM

## 2011-02-23 DIAGNOSIS — I442 Atrioventricular block, complete: Secondary | ICD-10-CM

## 2011-02-23 DIAGNOSIS — I509 Heart failure, unspecified: Secondary | ICD-10-CM

## 2011-02-23 LAB — PACEMAKER DEVICE OBSERVATION
AL AMPLITUDE: 1 mv
AL IMPEDENCE PM: 505 Ohm
AL THRESHOLD: 1.5 V
ATRIAL PACING PM: 21
BATTERY VOLTAGE: 2.79 V
RV LEAD AMPLITUDE: 2.8 mv
RV LEAD IMPEDENCE PM: 378 Ohm
RV LEAD THRESHOLD: 1 V
VENTRICULAR PACING PM: 100

## 2011-02-23 MED ORDER — ENALAPRIL MALEATE 10 MG PO TABS: 10.0000 mg | ORAL_TABLET | Freq: Every day | ORAL | Status: DC

## 2011-02-23 MED ORDER — FUROSEMIDE 20 MG PO TABS: 20.0000 mg | ORAL_TABLET | Freq: Every day | ORAL | Status: DC | PRN

## 2011-02-23 MED ORDER — CARVEDILOL 3.125 MG PO TABS: 3.1250 mg | ORAL_TABLET | Freq: Two times a day (BID) | ORAL | Status: DC

## 2011-02-23 NOTE — Progress Notes (Signed)
The patient presents today for routine electrophysiology followup.  Since last being seen in our clinic, the patient reports doing very well.  She has stable dypsnea with moderate activity.  Today, she denies symptoms of palpitations, chest pain, lower extremity edema, dizziness, presyncope, syncope, or neurologic sequela.  The patient feels that she is tolerating medications without difficulties and is otherwise without complaint today.   Past Medical History  Diagnosis Date  . Chronic systolic congestive heart failure     EF 40-45%  . Complete heart block     s/p AV nodal ablation at [facility]  . Atrial fibrillation   . Mitral valvular regurgitation    Past Surgical History  Procedure Date  . Cardiac pacemaker placement 1995    [facility],Medtronic Adapta ADDRL1  . Cardiac catheterization 09/11/2007  . Gastric bypass 09/2003  . Abdominal adhesion surgery 04/2006    Current Outpatient Prescriptions  Medication Sig Dispense Refill  . aspirin EC 81 MG tablet Take 81 mg by mouth daily.        . carvedilol (COREG) 3.125 MG tablet Take 1 tablet (3.125 mg total) by mouth 2 (two) times daily with a meal.  60 tablet  6  . enalapril (VASOTEC) 10 MG tablet Take 1 tablet (10 mg total) by mouth daily.  30 tablet  6  . furosemide (LASIX) 20 MG tablet Take 1 tablet (20 mg total) by mouth daily as needed.  30 tablet  6    No Known Allergies  History   Social History  . Marital Status: Married    Spouse Name: N/A    Number of Children: N/A  . Years of Education: N/A   Occupational History  . Not on file.   Social History Main Topics  . Smoking status: Never Smoker   . Smokeless tobacco: Never Used  . Alcohol Use: No  . Drug Use: No  . Sexually Active: Not on file   Other Topics Concern  . Not on file   Social History Narrative  . No narrative on file    Physical Exam: Filed Vitals:   02/23/11 1445  BP: 125/82  Pulse: 85  Height: 5\' 5"  (1.651 m)  Weight: 192 lb  (87.091 kg)  SpO2: 100%    GEN- The patient is well appearing, alert and oriented x 3 today.   Head- normocephalic, atraumatic Eyes-  Sclera clear, conjunctiva pink Ears- hearing intact Oropharynx- clear Neck- supple, no JVP Lymph- no cervical lymphadenopathy Lungs- Clear to ausculation bilaterally, normal work of breathing Chest-R sided pacemaker pocket is well healed Heart- Regular rate and rhythm, no murmurs, rubs or gallops, PMI not laterally displaced GI- soft, NT, ND, + BS Extremities- no clubbing, cyanosis, or edema  Pacemaker interrogation- reviewed in detail today,  See PACEART report  Assessment and Plan:

## 2011-02-23 NOTE — Assessment & Plan Note (Signed)
She has chronic CHF.  Echo last year revealed EF 45% She did have severe MR I have recommended repeat echo today, but she wishes to continue her current medicine strategy and repeat an echo in a year. She may benefit from surgical consultation for valve repair in the future when she will agree with this.

## 2011-02-23 NOTE — Assessment & Plan Note (Signed)
Permanent atrial arrhythmias On ASA per Dr Graciela Husbands,

## 2011-02-23 NOTE — Assessment & Plan Note (Signed)
Normal pacemaker function See Pace Art report No changes today  

## 2011-02-23 NOTE — Patient Instructions (Signed)
Your physician you to follow up in 1 year with Dr. Allred. You will receive a reminder letter in the mail one-two months in advance. If you don't receive a letter, please call our office to schedule the follow-up appointment. Your physician recommends that you continue on your current medications as directed. Please refer to the Current Medication list given to you today. 

## 2011-05-25 ENCOUNTER — Encounter: Payer: PRIVATE HEALTH INSURANCE | Admitting: *Deleted

## 2011-05-29 ENCOUNTER — Encounter: Payer: Self-pay | Admitting: *Deleted

## 2011-06-07 ENCOUNTER — Ambulatory Visit (INDEPENDENT_AMBULATORY_CARE_PROVIDER_SITE_OTHER): Payer: PRIVATE HEALTH INSURANCE | Admitting: *Deleted

## 2011-06-07 ENCOUNTER — Encounter: Payer: Self-pay | Admitting: Internal Medicine

## 2011-06-07 DIAGNOSIS — I442 Atrioventricular block, complete: Secondary | ICD-10-CM

## 2011-06-07 DIAGNOSIS — I4891 Unspecified atrial fibrillation: Secondary | ICD-10-CM

## 2011-06-13 LAB — REMOTE PACEMAKER DEVICE
AL AMPLITUDE: 2.8 mv
AL IMPEDENCE PM: 529 Ohm
ATRIAL PACING PM: 22
BATTERY VOLTAGE: 2.79 V
BRDY-0002RV: 60 {beats}/min
BRDY-0004RV: 130 {beats}/min
RV LEAD IMPEDENCE PM: 398 Ohm
RV LEAD THRESHOLD: 1.125 V
VENTRICULAR PACING PM: 100

## 2011-06-16 ENCOUNTER — Encounter: Payer: Self-pay | Admitting: *Deleted

## 2011-06-16 NOTE — Progress Notes (Signed)
Remote pacer check  

## 2011-09-14 ENCOUNTER — Ambulatory Visit (INDEPENDENT_AMBULATORY_CARE_PROVIDER_SITE_OTHER): Payer: PRIVATE HEALTH INSURANCE | Admitting: *Deleted

## 2011-09-14 ENCOUNTER — Encounter: Payer: Self-pay | Admitting: Internal Medicine

## 2011-09-14 DIAGNOSIS — Z950 Presence of cardiac pacemaker: Secondary | ICD-10-CM

## 2011-09-14 DIAGNOSIS — I442 Atrioventricular block, complete: Secondary | ICD-10-CM

## 2011-09-20 LAB — REMOTE PACEMAKER DEVICE
AL AMPLITUDE: 2.8 mv
AL IMPEDENCE PM: 497 Ohm
ATRIAL PACING PM: 21
BATTERY VOLTAGE: 2.79 V
BRDY-0002RV: 60 {beats}/min
BRDY-0004RV: 130 {beats}/min
RV LEAD IMPEDENCE PM: 371 Ohm
RV LEAD THRESHOLD: 1.125 V
VENTRICULAR PACING PM: 100

## 2011-10-03 ENCOUNTER — Encounter: Payer: Self-pay | Admitting: *Deleted

## 2011-12-25 ENCOUNTER — Encounter: Payer: PRIVATE HEALTH INSURANCE | Admitting: *Deleted

## 2012-01-05 ENCOUNTER — Encounter: Payer: Self-pay | Admitting: *Deleted

## 2012-01-12 ENCOUNTER — Ambulatory Visit (INDEPENDENT_AMBULATORY_CARE_PROVIDER_SITE_OTHER): Payer: PRIVATE HEALTH INSURANCE | Admitting: *Deleted

## 2012-01-12 DIAGNOSIS — Z950 Presence of cardiac pacemaker: Secondary | ICD-10-CM

## 2012-01-12 DIAGNOSIS — I442 Atrioventricular block, complete: Secondary | ICD-10-CM

## 2012-01-17 LAB — REMOTE PACEMAKER DEVICE
AL AMPLITUDE: 2.8 mv
AL IMPEDENCE PM: 478 Ohm
ATRIAL PACING PM: 22
BATTERY VOLTAGE: 2.79 V
BRDY-0002RV: 60 {beats}/min
BRDY-0004RV: 130 {beats}/min
RV LEAD IMPEDENCE PM: 353 Ohm
RV LEAD THRESHOLD: 1 V
VENTRICULAR PACING PM: 100

## 2012-01-23 ENCOUNTER — Encounter: Payer: Self-pay | Admitting: *Deleted

## 2012-02-05 ENCOUNTER — Encounter: Payer: Self-pay | Admitting: Internal Medicine

## 2012-02-23 ENCOUNTER — Encounter: Payer: PRIVATE HEALTH INSURANCE | Admitting: Internal Medicine

## 2012-02-28 ENCOUNTER — Encounter: Payer: PRIVATE HEALTH INSURANCE | Admitting: Internal Medicine

## 2012-04-22 ENCOUNTER — Encounter: Payer: PRIVATE HEALTH INSURANCE | Admitting: Internal Medicine

## 2012-05-08 ENCOUNTER — Encounter: Payer: PRIVATE HEALTH INSURANCE | Admitting: Internal Medicine

## 2012-05-17 ENCOUNTER — Other Ambulatory Visit: Payer: Self-pay | Admitting: *Deleted

## 2012-05-17 MED ORDER — ENALAPRIL MALEATE 10 MG PO TABS: 10.0000 mg | ORAL_TABLET | Freq: Every day | ORAL | Status: DC

## 2012-05-17 MED ORDER — CARVEDILOL 3.125 MG PO TABS: 3.1250 mg | ORAL_TABLET | Freq: Two times a day (BID) | ORAL | Status: DC

## 2012-05-17 MED ORDER — FUROSEMIDE 20 MG PO TABS: 20.0000 mg | ORAL_TABLET | Freq: Every day | ORAL | Status: DC | PRN

## 2012-05-17 NOTE — Telephone Encounter (Signed)
LEFT MESSAGE ON PT MOBILE TO CALL OFFICE TO MAKE APPOINTMENT. PT LAST SEEN 2012. ADVISED PT ON VM TO EITHER MAKE APPOINTMENT WITH DR. ALLRED OR PA/NP FOR MORE REFILLS TO BE DISPENCED TO HER PHARMACY. NUMBER PROVIDED.  Fax Received. Refill Completed. [name] (R.M.A)  Pt needs appointment then refill can be made

## 2012-05-28 ENCOUNTER — Encounter: Payer: Self-pay | Admitting: Internal Medicine

## 2012-05-28 ENCOUNTER — Ambulatory Visit (INDEPENDENT_AMBULATORY_CARE_PROVIDER_SITE_OTHER): Payer: PRIVATE HEALTH INSURANCE | Admitting: Internal Medicine

## 2012-05-28 VITALS — BP 127/66 | HR 69 | Ht 65.0 in | Wt 208.0 lb

## 2012-05-28 DIAGNOSIS — I5032 Chronic diastolic (congestive) heart failure: Secondary | ICD-10-CM

## 2012-05-28 DIAGNOSIS — I442 Atrioventricular block, complete: Secondary | ICD-10-CM

## 2012-05-28 DIAGNOSIS — Z950 Presence of cardiac pacemaker: Secondary | ICD-10-CM

## 2012-05-28 LAB — PACEMAKER DEVICE OBSERVATION
AL AMPLITUDE: 2.8 mv
AL IMPEDENCE PM: 520 Ohm
ATRIAL PACING PM: 22
BATTERY VOLTAGE: 2.7 V
BRDY-0002RV: 60 {beats}/min
BRDY-0004RV: 130 {beats}/min
RV LEAD IMPEDENCE PM: 397 Ohm
RV LEAD THRESHOLD: 1 V
VENTRICULAR PACING PM: 99.9

## 2012-05-28 MED ORDER — ENALAPRIL MALEATE 10 MG PO TABS: 10.0000 mg | ORAL_TABLET | Freq: Every day | ORAL | Status: DC

## 2012-05-28 MED ORDER — FUROSEMIDE 20 MG PO TABS: ORAL_TABLET | ORAL | Status: DC

## 2012-05-28 MED ORDER — CARVEDILOL 3.125 MG PO TABS: 3.1250 mg | ORAL_TABLET | Freq: Two times a day (BID) | ORAL | Status: DC

## 2012-05-28 NOTE — Progress Notes (Signed)
Patient Care Team: Estanislado Pandy, MD as PCP - General (Cardiology) [name], MD (Cardiology) Hillis Range, MD (Cardiology)   HPI  [name] [name] is a 52 y.o. 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 She underwent generator replacement in April of 2008.    Marland Kitchen  Her past cardiac history in addition to above is noted to attempted ablation of her arrhythmias at [facility] followed by AV junction ablation and pacemaker implantation there. Ejection fraction in 1992 had estimated 30% with normal coronaries. Most recent ejection fraction in March of  2008 was about 50%.  She underwent catheterization June 2009  because of symptoms of shortness of breath  and exertional chest discomfort and a previously abnormal Myoview scan  and her ejection fraction was 45% and her coronaries were normal.her LVEDP was 40. Echocardiogram 2011 demonstrated EF of 50% and an estimated PA pressure of 43/RA pressure 10.  She has noted recently worsening shortness of breathAnd dyspnea on exertion. She has some edema particularly after having been on for 3 nights in a row. She denies snoring and daytime somnolence.  She takes Lasix on an as-needed basis, frequency about once a week. She thinks that her breathing is better on these days. She is not salt restricted in her diet   Past Medical History  Diagnosis Date  . Chronic systolic congestive heart failure     EF 40-45%  . Complete heart block     s/p AV nodal ablation at [facility]  . Atrial fibrillation   . Mitral valvular regurgitation     Past Surgical History  Procedure Laterality Date  . Cardiac pacemaker placement  1995    [facility],Medtronic Adapta ADDRL1  . Cardiac catheterization  09/11/2007  . Gastric bypass  09/2003  . Abdominal adhesion surgery  04/2006    Current Outpatient Prescriptions  Medication Sig Dispense Refill  . aspirin EC 81 MG tablet Take  81 mg by mouth daily.        . carvedilol (COREG) 3.125 MG tablet Take 1 tablet (3.125 mg total) by mouth 2 (two) times daily with a meal.  60 tablet  0  . enalapril (VASOTEC) 10 MG tablet Take 1 tablet (10 mg total) by mouth daily.  30 tablet  0  . furosemide (LASIX) 20 MG tablet Take 1 tablet (20 mg total) by mouth daily as needed.  30 tablet  0   No current facility-administered medications for this visit.    No Known Allergies  Review of Systems negative except from HPI and PMH  Physical Exam BP 127/66  Pulse 69  Ht 5\' 5"  (1.651 m)  Wt 208 lb (94.348 kg)  BMI 34.61 kg/m2 Well developed and well nourished in no acute distress HENT normal E scleral and icterus clear Neck Supple JVP 9-10 ; carotids brisk and full Clear to ausculation Regular rate and rhythm, no murmurs gallops or rub Soft with active bowel sounds No clubbing cyanosis Trace Edema Alert and oriented, grossly normal motor and sensory function Skin Warm and Dry  ECG demonstrates atrial flutter/fibrillation with complete heart block and ventricular pacing  Assessment and  Plan

## 2012-05-28 NOTE — Assessment & Plan Note (Signed)
Patient has dyspnea on exertion. She has noted some improvement with diuresis. She has elevated neck veins. Interestingly also she had a catheterization LVEDP described as 40. His pulmonary hypertension with a measured PA pressure 2 years later of 43. (at 40 may have been  14). We will check an echo and look at E./E' as well as PA pressures. We'll increase her diuretic to 4 times a week. He discussed the potential role of chronotropic incompetence and be evaluated increasing her heart rate. In the event that the aforementioned interventions and studies are unilluminating we will submit her for treadmill testing with heart rate reprogramming to see if improved chronotropic competence can be helpful. Interrogation of her device today demonstrated only 2% of her heart beats, i.e. 15 minutes a day, or faster than 100 beats per minute. There is obviously a trade-off between heart rate in diastolic filling and given the elevated LVEDP and pulmonary hypertension I would like at this point to err on the side of lower heart rates; I have reviewed this with the patient and she is agreeable

## 2012-05-28 NOTE — Assessment & Plan Note (Signed)
As above.

## 2012-05-28 NOTE — Patient Instructions (Addendum)
Remote monitoring is used to monitor your Pacemaker of ICD from home. This monitoring reduces the number of office visits required to check your device to one time per year. It allows [place] to keep an eye on the functioning of your device to ensure it is working properly. You are scheduled for a device check from home on Aug 26, 2012. You may send your transmission at any time that day. If you have a wireless device, the transmission will be sent automatically. After your physician reviews your transmission, you will receive a postcard with your next transmission date.  Your physician wants you to follow-up in: 1 year with Dr Graciela Husbands.  You will receive a reminder letter in the mail two months in advance. If you don't receive a letter, please call our office to schedule the follow-up appointment.  Your physician has requested that you have an exercise tolerance test. For further information please visit [email or web address] Please also follow instruction sheet, as given. WITH DR Graciela Husbands  Your physician has requested that you have an echocardiogram. Echocardiography is a painless test that uses sound waves to create images of your heart. It provides your doctor with information about the size and shape of your heart and how well your heart's chambers and valves are working. This procedure takes approximately one hour. There are no restrictions for this procedure. (AT [facility])  Your physician has recommended you make the following change in your medication: INCREASE Furosemide to 4 times a week

## 2012-05-28 NOTE — Assessment & Plan Note (Signed)
The patient's device was interrogated and the information was fully reviewed.  The device was reprogrammed to  Minimize ADL rate

## 2012-06-05 ENCOUNTER — Other Ambulatory Visit: Payer: PRIVATE HEALTH INSURANCE

## 2012-06-12 ENCOUNTER — Other Ambulatory Visit (INDEPENDENT_AMBULATORY_CARE_PROVIDER_SITE_OTHER): Payer: PRIVATE HEALTH INSURANCE

## 2012-06-12 ENCOUNTER — Other Ambulatory Visit: Payer: Self-pay

## 2012-06-12 DIAGNOSIS — I442 Atrioventricular block, complete: Secondary | ICD-10-CM

## 2012-06-18 ENCOUNTER — Other Ambulatory Visit: Payer: Self-pay

## 2012-06-18 ENCOUNTER — Ambulatory Visit (INDEPENDENT_AMBULATORY_CARE_PROVIDER_SITE_OTHER): Payer: PRIVATE HEALTH INSURANCE | Admitting: Internal Medicine

## 2012-06-18 DIAGNOSIS — Z950 Presence of cardiac pacemaker: Secondary | ICD-10-CM

## 2012-06-18 DIAGNOSIS — I442 Atrioventricular block, complete: Secondary | ICD-10-CM

## 2012-06-18 DIAGNOSIS — R0602 Shortness of breath: Secondary | ICD-10-CM

## 2012-06-18 LAB — PACEMAKER DEVICE OBSERVATION
AL IMPEDENCE PM: 513 Ohm
ATRIAL PACING PM: 15
BATTERY VOLTAGE: 2.79 V
RV LEAD IMPEDENCE PM: 393 Ohm
VENTRICULAR PACING PM: 99

## 2012-06-18 NOTE — Progress Notes (Signed)
Exercise Treadmill Test  Pre-Exercise Testing Evaluation Rhythm: NSR with V-Pacing  Rate: 62   PR:  .20 QRS:  .14  QT:  .45 QTc:            Test  Exercise Tolerance Test Ordering MD: Sherryl Manges, MD  Interpreting MD: Sherryl Manges, MD  Unique Test No: 1  Treadmill:  1  Indication for ETT: exertional dyspnea  Contraindication to ETT: No   Stress Modality: exercise - treadmill  Cardiac Imaging Performed: non   Protocol: standard Bruce - maximal  Max BP:    Max MPHR (bpm):  169 85% MPR (bpm):  144  MPHR obtained (bpm):   % MPHR obtained:    Reached 85% MPHR (min:sec):   Total Exercise Time (min-sec):    Workload in METS:    Borg Scale:    Reason ETT Terminated:  fatigue    ST Segment Analysis At Rest:  With Exercise:   Other Information Arrhythmia:   Angina during ETT:   Quality of ETT:    ETT Interpretation:  HR improved with reprogramming 93>>102 with walking Comments: call later week re real time walking continue diuretics andconsider cardiac rehab for conditioning     Recommendations:

## 2012-07-23 ENCOUNTER — Other Ambulatory Visit: Payer: Self-pay | Admitting: Internal Medicine

## 2012-08-26 ENCOUNTER — Encounter: Payer: PRIVATE HEALTH INSURANCE | Admitting: *Deleted

## 2012-08-30 ENCOUNTER — Encounter: Payer: Self-pay | Admitting: *Deleted

## 2012-09-17 ENCOUNTER — Other Ambulatory Visit: Payer: Self-pay | Admitting: Internal Medicine

## 2013-02-04 ENCOUNTER — Encounter: Payer: Self-pay | Admitting: Internal Medicine

## 2013-03-29 ENCOUNTER — Other Ambulatory Visit: Payer: Self-pay | Admitting: Internal Medicine

## 2013-04-14 ENCOUNTER — Encounter: Payer: Self-pay | Admitting: Cardiology

## 2013-05-29 ENCOUNTER — Encounter (INDEPENDENT_AMBULATORY_CARE_PROVIDER_SITE_OTHER): Payer: Self-pay

## 2013-05-29 ENCOUNTER — Encounter: Payer: Self-pay | Admitting: Internal Medicine

## 2013-05-29 ENCOUNTER — Ambulatory Visit (INDEPENDENT_AMBULATORY_CARE_PROVIDER_SITE_OTHER): Payer: PRIVATE HEALTH INSURANCE | Admitting: Internal Medicine

## 2013-05-29 VITALS — BP 92/68 | HR 69 | Ht 65.0 in | Wt 210.8 lb

## 2013-05-29 DIAGNOSIS — I442 Atrioventricular block, complete: Secondary | ICD-10-CM

## 2013-05-29 DIAGNOSIS — Z950 Presence of cardiac pacemaker: Secondary | ICD-10-CM

## 2013-05-29 DIAGNOSIS — I5032 Chronic diastolic (congestive) heart failure: Secondary | ICD-10-CM

## 2013-05-29 LAB — MDC_IDC_ENUM_SESS_TYPE_INCLINIC
Battery Impedance: 250 Ohm
Battery Remaining Longevity: 96 mo
Battery Voltage: 2.79 V
Brady Statistic AP VP Percent: 20 %
Brady Statistic AP VS Percent: 0 %
Brady Statistic AS VP Percent: 80 %
Brady Statistic AS VS Percent: 0 %
Date Time Interrogation Session: 20150219093312
Lead Channel Impedance Value: 356 Ohm
Lead Channel Impedance Value: 506 Ohm
Lead Channel Pacing Threshold Amplitude: 0.5 V
Lead Channel Pacing Threshold Amplitude: 1 V
Lead Channel Pacing Threshold Pulse Width: 0.4 ms
Lead Channel Pacing Threshold Pulse Width: 0.4 ms
Lead Channel Sensing Intrinsic Amplitude: 0.7 mV
Lead Channel Sensing Intrinsic Amplitude: 2.8 mV
Lead Channel Setting Pacing Amplitude: 2 V
Lead Channel Setting Pacing Amplitude: 2.5 V
Lead Channel Setting Pacing Pulse Width: 0.4 ms
Lead Channel Setting Sensing Sensitivity: 2 mV

## 2013-05-29 MED ORDER — FUROSEMIDE 20 MG PO TABS: ORAL_TABLET | ORAL | Status: DC

## 2013-05-29 MED ORDER — ASPIRIN 81 MG PO TABS: 81.0000 mg | ORAL_TABLET | Freq: Every day | ORAL | Status: DC

## 2013-05-29 MED ORDER — ENALAPRIL MALEATE 10 MG PO TABS: 5.0000 mg | ORAL_TABLET | Freq: Every day | ORAL | Status: DC

## 2013-05-29 NOTE — Progress Notes (Signed)
Patient Care Team: Estanislado Pandy, MD as PCP - General (Cardiology) [name], MD (Cardiology) Hillis Range, MD (Cardiology)   HPI  [name] [name] is a 53 y.o. 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 She underwent generator replacement in April of 2008.  Marland Kitchen  She has significant complaints of dyspnea on exertion. She has some edema  . Her past cardiac history in addition to above is noted to attempted ablation of her arrhythmias at [facility] followed by AV junction ablation and pacemaker implantation there. Ejection fraction in 1992 had estimated 30% with normal coronaries. Most recent ejection fraction in March of  2008 was about 50%.  She underwent catheterization June 2009 because of symptoms of shortness of breath  and exertional chest discomfort and a previously abnormal Myoview scan  and her ejection fraction was 45% and her coronaries were normal.her LVEDP >> 40.  Echocardiogram 2014 demonstrated EF of 40-45% And unable to measure E/E'     Past Medical History  Diagnosis Date  . Chronic diastolic heart failure     EF 27-78%  . Complete heart block     s/p AV nodal ablation at [facility]  . Atrial fibrillation   . Mitral valvular regurgitation   . Pacemaker- medtronic     Initial PM 1995; generator 2009, infection with contralateral implant 2010    Past Surgical History  Procedure Laterality Date  . Cardiac pacemaker placement  1995    [facility],Medtronic Adapta ADDRL1  . Cardiac catheterization  09/11/2007  . Gastric bypass  09/2003  . Abdominal adhesion surgery  04/2006    Current Outpatient Prescriptions  Medication Sig Dispense Refill  . aspirin 325 MG tablet Take 325 mg by mouth daily.      . carvedilol (COREG) 3.125 MG tablet TAKE 1 TABLET BY MOUTH TWICE A DAY WITH A MEAL  60 tablet  4  . enalapril (VASOTEC) 10 MG tablet TAKE 1 TABLET BY MOUTH EVERY DAY  30  tablet  1  . furosemide (LASIX) 20 MG tablet Take 20 mg by mouth daily as needed.       No current facility-administered medications for this visit.    No Known Allergies  Review of Systems negative except from HPI and PMH  Physical Exam BP 92/68  Pulse 69  Ht 5\' 5"  (1.651 m)  Wt 210 lb 12.8 oz (95.618 kg)  BMI 35.08 kg/m2 Well developed and well nourished in no acute distress HENT normal E scleral and icterus clear Neck Supple JVP flat; carotids brisk and full Clear to ausculation  Regular rate and rhythm, 2/6 murmurs gallops or rub Soft with active bowel sounds No clubbing cyanosis Trace Edema Alert and oriented, grossly normal motor and sensory function Skin Warm and Dry  ECG demonstrates atrial rhythm at a rate of about 100 beats per minute with complete heart block asynchronous ventricular pacing  Assessment and  Plan  Complete heart block status post AV junction ablation stable post pacing  Cardiomyopathy-nonischemic ejection fraction 40-45% continue beta blockers and ACE inhibitors; we'll decrease ACE inhibitor as her blood pressure is low will increase her diuretics  HFpEF/HFrEF-chronic will increase her Lasix from 20 every other day to 40/20. We'll get metabolic profile in 2 weeks. Her LVEDP was 40. We'll also reprogrammed her pacemaker to try to enlist her atrial kick. I've asked her also to consider a sleep study  Pacemaker  medtronic  The patient's device was interrogated and the information was fully reviewed.  The device was reprogrammed to change moved from DDiR--DDDR with a max track rate of 95  Atrial tachy no real atrial fibrillation to speak of. We will decrease her aspirin from 325--81  I  discuss also the potential downside for programming her DDD and that we'll force her heart rates faster.hence, we have programmed her max track at 95. I've also increase the AV delay. Will reassess her in approximately 4 weeks' time

## 2013-05-29 NOTE — Assessment & Plan Note (Signed)
The patient's device was interrogated and the information was fully reviewed.  The device was reprogrammed to  As above 

## 2013-05-29 NOTE — Patient Instructions (Addendum)
Your physician has recommended you make the following change in your medication:  1) Decrease Aspirin to 81mg  daily 2) Decrease Enalapril to 5 mg daily 3) Change the way you take your Lasix - take 40 mg one day, take 20 mg next day, and continue alternating  Your physician recommends that you return for lab work in: 1 week for BMET/Magnesium (pt to have it drawn in [place], results requested to be faxed to [place])  Your physician recommends that you schedule a follow-up appointment in: 4 weeks with Dr. Graciela Husbands.

## 2013-06-27 ENCOUNTER — Encounter: Payer: Self-pay | Admitting: Internal Medicine

## 2013-07-01 ENCOUNTER — Ambulatory Visit (INDEPENDENT_AMBULATORY_CARE_PROVIDER_SITE_OTHER): Payer: PRIVATE HEALTH INSURANCE | Admitting: Internal Medicine

## 2013-07-01 ENCOUNTER — Encounter: Payer: Self-pay | Admitting: Internal Medicine

## 2013-07-01 VITALS — BP 112/80 | HR 69 | Ht 65.0 in | Wt 210.0 lb

## 2013-07-01 DIAGNOSIS — I5032 Chronic diastolic (congestive) heart failure: Secondary | ICD-10-CM

## 2013-07-01 DIAGNOSIS — Z950 Presence of cardiac pacemaker: Secondary | ICD-10-CM

## 2013-07-01 DIAGNOSIS — I442 Atrioventricular block, complete: Secondary | ICD-10-CM

## 2013-07-01 MED ORDER — FUROSEMIDE 20 MG PO TABS: 40.0000 mg | ORAL_TABLET | Freq: Every day | ORAL | Status: DC

## 2013-07-01 NOTE — Patient Instructions (Addendum)
Your physician has recommended you make the following change in your medication:  1) Change the way you take your Furosemide - take 40 mg daily  Remote monitoring is used to monitor your Pacemaker of ICD from home. This monitoring reduces the number of office visits required to check your device to one time per year. It allows [place] to keep an eye on the functioning of your device to ensure it is working properly. You are scheduled for a device check from home on 10/02/13. You may send your transmission at any time that day. If you have a wireless device, the transmission will be sent automatically. After your physician reviews your transmission, you will receive a postcard with your next transmission date.   Your physician wants you to follow-up in: 6 months with Dr. Graciela Husbands. You will receive a reminder letter in the mail two months in advance. If you don't receive a letter, please call our office to schedule the follow-up appointment.

## 2013-07-01 NOTE — Progress Notes (Signed)
Patient Care Team: Estanislado Pandy, MD as PCP - General (Cardiology) [name], MD (Cardiology) Hillis Range, MD (Cardiology)   HPI  [name] [name] is a 53 y.o. 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 She underwent generator replacement in April of 2008.  Marland Kitchen  She has significant complaints of dyspnea on exertion. She has some edema  . Her past cardiac history in addition to above is noted to attempted ablation of her arrhythmias at [facility] followed by AV junction ablation and pacemaker implantation there. Ejection fraction in 1992 had estimated 30% with normal coronaries. Most recent ejection fraction in March of  2008 was about 50%.  She underwent catheterization June 2009 because of symptoms of shortness of breath  and exertional chest discomfort and a previously abnormal Myoview scan  and her ejection fraction was 45% and her coronaries were normal.her LVEDP >> 40.  Echocardiogram 2014 demonstrated EF of 40-45% And unable to measure E/E'     Past Medical History  Diagnosis Date  . Chronic diastolic heart failure     EF 02-58%  . Complete heart block     s/p AV nodal ablation at [facility]  . Atrial fibrillation   . Mitral valvular regurgitation   . Pacemaker- medtronic     Initial PM 1995; generator 2009, infection with contralateral implant 2010    Past Surgical History  Procedure Laterality Date  . Cardiac pacemaker placement  1995    [facility],Medtronic Adapta ADDRL1  . Cardiac catheterization  09/11/2007  . Gastric bypass  09/2003  . Abdominal adhesion surgery  04/2006    Current Outpatient Prescriptions  Medication Sig Dispense Refill  . aspirin 81 MG tablet Take 1 tablet (81 mg total) by mouth daily.  30 tablet  3  . carvedilol (COREG) 3.125 MG tablet TAKE 1 TABLET BY MOUTH TWICE A DAY WITH A MEAL  60 tablet  4  . enalapril (VASOTEC) 10 MG tablet Take 0.5  tablets (5 mg total) by mouth daily.  15 tablet  3  . furosemide (LASIX) 20 MG tablet Alternate 40/20 - take 40 mg one day, take 20 mg next day - repeat this pattern.  45 tablet  3   No current facility-administered medications for this visit.    No Known Allergies  Review of Systems negative except from HPI and PMH  Physical Exam BP 112/80  Pulse 69  Ht 5\' 5"  (1.651 m)  Wt 210 lb (95.255 kg)  BMI 34.95 kg/m2 Well developed and well nourished in no acute distress HENT normal E scleral and icterus clear Neck Supple JVP flat; carotids brisk and full Clear to ausculation  Regular rate and rhythm, 2/6 murmurs gallops or rub Soft with active bowel sounds No clubbing cyanosis Trace Edema Alert and oriented, grossly normal motor and sensory function Skin Warm and Dry  ECG demonstrates atrial rhythm at a rate of about 100 beats per minute with complete heart block asynchronous ventricular pacing  Assessment and  Plan  Complete heart block status post AV junction ablation    Cardiomyopathy-nonischemic ejection fraction 40-45%    HFpEF/HFrEF-chronic  .   Pacemaker  medtronic    Atrial tachy    She did not tolerate reprogramming to the DDD mode in her tends to capture some degree of AV synchrony. We will reprogram her to her prior mode  She had markedly elevated LVEDP is  a we will push for diuresis to see if we can't abrogate some of her symptoms of dyspnea on exertion..  I will talk with Dr. Dorthea CoveB to see if anything specifically needs to be done given the secondary pulmonary hypertension.

## 2013-07-02 LAB — MDC_IDC_ENUM_SESS_TYPE_INCLINIC
Battery Impedance: 250 Ohm
Battery Remaining Longevity: 96 mo
Battery Voltage: 2.79 V
Brady Statistic AP VP Percent: 6 %
Brady Statistic AP VS Percent: 0 %
Brady Statistic AS VP Percent: 94 %
Brady Statistic AS VS Percent: 0 %
Date Time Interrogation Session: 20150324185636
Lead Channel Impedance Value: 366 Ohm
Lead Channel Impedance Value: 527 Ohm
Lead Channel Setting Pacing Amplitude: 2 V
Lead Channel Setting Pacing Amplitude: 2.5 V
Lead Channel Setting Pacing Pulse Width: 0.4 ms
Lead Channel Setting Sensing Sensitivity: 2 mV

## 2013-07-08 ENCOUNTER — Encounter: Payer: Self-pay | Admitting: Internal Medicine

## 2013-10-02 ENCOUNTER — Ambulatory Visit (INDEPENDENT_AMBULATORY_CARE_PROVIDER_SITE_OTHER): Payer: PRIVATE HEALTH INSURANCE | Admitting: *Deleted

## 2013-10-02 ENCOUNTER — Telehealth: Payer: Self-pay | Admitting: Cardiology

## 2013-10-02 DIAGNOSIS — I4819 Other persistent atrial fibrillation: Secondary | ICD-10-CM

## 2013-10-02 DIAGNOSIS — I442 Atrioventricular block, complete: Secondary | ICD-10-CM

## 2013-10-02 DIAGNOSIS — I4891 Unspecified atrial fibrillation: Secondary | ICD-10-CM

## 2013-10-02 NOTE — Telephone Encounter (Signed)
LMOVM reminding pt to send remote transmission.   

## 2013-10-02 NOTE — Progress Notes (Signed)
Remote pacemaker transmission.   

## 2013-10-06 LAB — MDC_IDC_ENUM_SESS_TYPE_REMOTE
Battery Impedance: 298 Ohm
Battery Remaining Longevity: 97 mo
Battery Voltage: 2.79 V
Brady Statistic AP VP Percent: 9 %
Brady Statistic AP VS Percent: 0 %
Brady Statistic AS VP Percent: 91 %
Brady Statistic AS VS Percent: 0 %
Date Time Interrogation Session: 20150625142908
Lead Channel Impedance Value: 373 Ohm
Lead Channel Impedance Value: 527 Ohm
Lead Channel Pacing Threshold Amplitude: 1 V
Lead Channel Pacing Threshold Pulse Width: 0.4 ms
Lead Channel Sensing Intrinsic Amplitude: 2.8 mV
Lead Channel Setting Pacing Amplitude: 2 V
Lead Channel Setting Pacing Amplitude: 2.5 V
Lead Channel Setting Pacing Pulse Width: 0.4 ms
Lead Channel Setting Sensing Sensitivity: 2 mV

## 2013-10-13 ENCOUNTER — Other Ambulatory Visit: Payer: Self-pay | Admitting: *Deleted

## 2013-10-13 MED ORDER — ENALAPRIL MALEATE 10 MG PO TABS: 5.0000 mg | ORAL_TABLET | Freq: Every day | ORAL | Status: DC

## 2013-10-14 ENCOUNTER — Encounter: Payer: Self-pay | Admitting: Cardiology

## 2013-10-22 ENCOUNTER — Encounter: Payer: Self-pay | Admitting: Internal Medicine

## 2013-12-23 ENCOUNTER — Other Ambulatory Visit: Payer: Self-pay | Admitting: Internal Medicine

## 2014-01-12 ENCOUNTER — Encounter: Payer: Self-pay | Admitting: Cardiology

## 2014-01-13 ENCOUNTER — Ambulatory Visit: Payer: PRIVATE HEALTH INSURANCE | Admitting: Cardiology

## 2014-01-14 ENCOUNTER — Ambulatory Visit (INDEPENDENT_AMBULATORY_CARE_PROVIDER_SITE_OTHER): Payer: PRIVATE HEALTH INSURANCE | Admitting: Cardiology

## 2014-01-14 ENCOUNTER — Encounter: Payer: Self-pay | Admitting: Cardiology

## 2014-01-14 ENCOUNTER — Telehealth: Payer: Self-pay | Admitting: Cardiology

## 2014-01-14 VITALS — BP 107/69 | HR 69 | Ht 65.0 in | Wt 230.0 lb

## 2014-01-14 DIAGNOSIS — R0602 Shortness of breath: Secondary | ICD-10-CM

## 2014-01-14 DIAGNOSIS — Z950 Presence of cardiac pacemaker: Secondary | ICD-10-CM

## 2014-01-14 MED ORDER — FUROSEMIDE 40 MG PO TABS: 40.0000 mg | ORAL_TABLET | Freq: Two times a day (BID) | ORAL | Status: DC

## 2014-01-14 NOTE — Telephone Encounter (Signed)
No precert required 

## 2014-01-14 NOTE — Telephone Encounter (Signed)
Checking percert for Echo to be done at [facility] ( patient works at [employer]) set For 01-19-14

## 2014-01-14 NOTE — Patient Instructions (Addendum)
   Increase Lasix to 40mg  twice a day . New sent to pharmacy  Continue all other medications.   Your physician has requested that you have an echocardiogram. Echocardiography is a painless test that uses sound waves to create images of your heart. It provides your doctor with information about the size and shape of your heart and how well your heart's chambers and valves are working. This procedure takes approximately one hour. There are no restrictions for this procedure. Office will contact with results via phone or letter.   Your physician wants you to follow up in 3-4 weeks.

## 2014-01-14 NOTE — Progress Notes (Signed)
Clinical Summary [name] is a 53 y.o.female last seen by Dr Graciela Husbands, this is our first visit together. She is seen for the following medical problems.  1. History of tachycardia induced cardiomyopathy - s/p AV nodal ablation, now with permanent pacemaker - last echo 06/2012 LVEF 40-45%,  Mild to moder MR. Diastolic function not described. Cath 2009 with patent coronaries, high LVEDP. - reports several year history of SOB. Progressing in severity. DOE at approx 1 block on level ground, more limited with incline. Denies any chest pain. Has chronic palpitations. + LE edema which has also progressed. No orthopnea - limiting sodium intake. Takes naproxen once daily. - compliant with lasix. Weight up from 210 up to 230 lbs.  - no history tobacco, though second hand exposure with her parents. No cough, fevers, or chills.   2. Permanent pacemaker - device check 09/2013 normal function - denies any lightheadedness or dizziness Past Medical History  Diagnosis Date  . Chronic diastolic heart failure     EF 51-10%  . Complete heart block     s/p AV nodal ablation at [facility]  . Atrial fibrillation   . Mitral valvular regurgitation   . Pacemaker- medtronic     Initial PM 1995; generator 2009, infection with contralateral implant 2010     No Known Allergies   Current Outpatient Prescriptions  Medication Sig Dispense Refill  . aspirin 81 MG tablet Take 1 tablet (81 mg total) by mouth daily.  30 tablet  3  . carvedilol (COREG) 3.125 MG tablet TAKE 1 TABLET BY MOUTH TWICE A DAY WITH A MEAL  60 tablet  4  . enalapril (VASOTEC) 10 MG tablet TAKE ONE-HALF TABLET BY MOUTH ONCE DAILY  15 tablet  0  . furosemide (LASIX) 20 MG tablet Take 2 tablets (40 mg total) by mouth daily.  60 tablet  5   No current facility-administered medications for this visit.     Past Surgical History  Procedure Laterality Date  . Cardiac pacemaker placement  1995    [facility],Medtronic Adapta  ADDRL1  . Cardiac catheterization  09/11/2007  . Gastric bypass  09/2003  . Abdominal adhesion surgery  04/2006     No Known Allergies    No family history on file.   Social History Ms. Renaldo reports that she has never smoked. She has never used smokeless tobacco. Ms. Masuda reports that she does not drink alcohol.   Review of Systems CONSTITUTIONAL: No weight loss, fever, chills, weakness or fatigue.  HEENT: Eyes: No visual loss, blurred vision, double vision or yellow sclerae.No hearing loss, sneezing, congestion, runny nose or sore throat.  SKIN: No rash or itching.  CARDIOVASCULAR: per HPI RESPIRATORY: No  cough or sputum.  GASTROINTESTINAL: No anorexia, nausea, vomiting or diarrhea. No abdominal pain or blood.  GENITOURINARY: No burning on urination, no polyuria NEUROLOGICAL: No headache, dizziness, syncope, paralysis, ataxia, numbness or tingling in the extremities. No change in bowel or bladder control.  MUSCULOSKELETAL: No muscle, back pain, joint pain or stiffness.  LYMPHATICS: No enlarged nodes. No history of splenectomy.  PSYCHIATRIC: No history of depression or anxiety.  ENDOCRINOLOGIC: No reports of sweating, cold or heat intolerance. No polyuria or polydipsia.  Marland Kitchen   Physical Examination p 69 bp 107/69 Wt 230 lbs BMI 38 Gen: resting comfortably, no acute distress HEENT: no scleral icterus, pupils equal round and reactive, no palptable cervical adenopathy,  CV: RRR, 3/6 systolic murmur, no JVD, no carotid bruits Resp:  Clear to auscultation bilaterally GI: abdomen is soft, non-tender, non-distended, normal bowel sounds, no hepatosplenomegaly MSK: extremities are warm, no edema.  Skin: warm, no rash Neuro:  no focal deficits Psych: appropriate affect   Diagnostic Studies 09/2007 Cath RESULTS: Left main coronary artery: The left main coronary artery was  free of any disease.  Left anterior descending artery: Left anterior descending artery gave  rise to 3  septal perforator and diagonal Amelita Risinger. These in the LAD  proper were free of significant disease.  The circumflex artery: The circumflex artery gave rise to a ramus  Haygen Zebrowski, a marginal Amani Nodarse, and 2 posterolateral branches. These vessels  were free of significant disease.  The right coronary artery: The right coronary artery is a moderate-  sized vessel and gave rise to a conus Costella Schwarz, ventricle Chandlar Staebell, a  posterior descending Supriya Beaston, and a posterolateral Jazmin Ley. These vessels  were free of significant disease.  The left ventriculogram: The left ventriculogram performed on RAO  projection showed mild global hypokinesis. The estimated ejection  fraction was 45%.  The aortic pressure was 105/58 with a mean of 78 and left ventricular  pressure was 105/40.  CONCLUSION:  1. Normal coronary angiography.  2. Mild global hypokinesis with an estimated fraction of 45%.  RECOMMENDATIONS: The patient has no source of ischemia to account for  her recent exertional chest pain. Her LV function by angiography is  similar to what the measurements were by echocardiography. We will plan  to followup with Dr. Graciela HusbandsKlein to who can decided regarding further  evaluation of chest pain.     Assessment and Plan   1. SOB - unclear etiology, will repeat echo. Prior echo showed mild LV systolic dysfunction, mild to mod MR, mod pulm HTN. Progression of any of those could account for symptoms. Obtain echo. Recent labs also show she is significantly anemic Hgb 8.1, this is being addressed by her pcp - will increase her lasix to 40mg  bid, she may take 1-2 times daily as needed.   2. Permanent pacemaker - normal function on recent check, continue regular device clinic appointments  F/u 3-4 weeks     Antoine PocheJonathan F. Malaina Mortellaro, M.D.

## 2014-01-16 ENCOUNTER — Encounter: Payer: Self-pay | Admitting: *Deleted

## 2014-01-20 ENCOUNTER — Other Ambulatory Visit: Payer: Self-pay | Admitting: *Deleted

## 2014-01-20 DIAGNOSIS — R0602 Shortness of breath: Secondary | ICD-10-CM

## 2014-02-18 ENCOUNTER — Encounter: Payer: Self-pay | Admitting: *Deleted

## 2014-02-23 NOTE — Progress Notes (Signed)
Patient ID: CHANYAH ISAAK, female   DOB: 03-28-1961, 53 y.o.   MRN: [id number]      Clinical Summary Ms. Hromadka is a 52 y.o.female seen today for follow up of the following medical problems.   1. History of tachycardia induced cardiomyopathy - s/p AV nodal ablation, now with permanent pacemaker - last visit patient reported worsening SOB and DOE, weight gain, and increased LE edema. Her lasix was increased to 40mg  bid, since then weight has trended down and symptoms have improved. She also has anemia, and recently received IV iron and B12 which also improved her SOB - since last visit repeated echo 01/2014 (done at [facility], disc indepedently reviewed), mildly dilated LV (LVIDs 4.4), LVEF 50-55%, mod to severe LAE, mod to severe MR, PASP 54   2. Permanent pacemaker - device check 09/2013 normal function - denies any lightheadedness or dizziness  3. Mitral regurgitation - moderate to severe by last echo 01/2014 - SOB and edema have improved since increasing lasix.   4. Anemia - symptoms improved with IV iron, B12 injection by pcp Past Medical History  Diagnosis Date  . Chronic diastolic heart failure     EF 97-41%  . Complete heart block     s/p AV nodal ablation at [facility]  . Atrial fibrillation   . Mitral valvular regurgitation   . Pacemaker- medtronic     Initial PM 1995; generator 2009, infection with contralateral implant 2010     No Known Allergies   Current Outpatient Prescriptions  Medication Sig Dispense Refill  . aspirin 81 MG tablet Take 1 tablet (81 mg total) by mouth daily. 30 tablet 3  . carvedilol (COREG) 6.25 MG tablet Take 6.25 mg by mouth 2 (two) times daily with a meal.    . enalapril (VASOTEC) 10 MG tablet Take one by mouth daily    . furosemide (LASIX) 40 MG tablet Take 1 tablet (40 mg total) by mouth 2 (two) times daily. 60 tablet 6  . omeprazole (PRILOSEC) 20 MG capsule Take 20 mg by mouth daily.    . traZODone (DESYREL) 100 MG tablet Take 100 mg  by mouth at bedtime.     No current facility-administered medications for this visit.     Past Surgical History  Procedure Laterality Date  . Cardiac pacemaker placement  1995    [facility],Medtronic Adapta ADDRL1  . Cardiac catheterization  09/11/2007  . Gastric bypass  09/2003  . Abdominal adhesion surgery  04/2006     No Known Allergies    No family history on file.   Social History Ms. Dilks reports that she has never smoked. She has never used smokeless tobacco. Ms. Bellmore reports that she does not drink alcohol.   Review of Systems CONSTITUTIONAL: No weight loss, fever, chills, weakness or fatigue.  HEENT: Eyes: No visual loss, blurred vision, double vision or yellow sclerae.No hearing loss, sneezing, congestion, runny nose or sore throat.  SKIN: No rash or itching.  CARDIOVASCULAR: per HPI RESPIRATORY: No shortness of breath, cough or sputum.  GASTROINTESTINAL: No anorexia, nausea, vomiting or diarrhea. No abdominal pain or blood.  GENITOURINARY: No burning on urination, no polyuria NEUROLOGICAL: No headache, dizziness, syncope, paralysis, ataxia, numbness or tingling in the extremities. No change in bowel or bladder control.  MUSCULOSKELETAL: No muscle, back pain, joint pain or stiffness.  LYMPHATICS: No enlarged nodes. No history of splenectomy.  PSYCHIATRIC: No history of depression or anxiety.  ENDOCRINOLOGIC: No reports of sweating, cold or heat  intolerance. No polyuria or polydipsia.  Marland Kitchen.   Physical Examination p 69 bp 107/74 Wt 224 lbs BMI 37 Gen: resting comfortably, no acute distress HEENT: no scleral icterus, pupils equal round and reactive, no palptable cervical adenopathy,  CV: RRR, 2/6 systolic murmur at apex, no JVD, no carotid bruits Resp: Clear to auscultation bilaterally GI: abdomen is soft, non-tender, non-distended, normal bowel sounds, no hepatosplenomegaly MSK: extremities are warm, no edema.  Skin: warm, no rash Neuro:   no focal deficits Psych: appropriate affect   Diagnostic Studies 09/2007 Cath RESULTS: Left main coronary artery: The left main coronary artery was  free of any disease.  Left anterior descending artery: Left anterior descending artery gave  rise to 3 septal perforator and diagonal Raiza Kiesel. These in the LAD  proper were free of significant disease.  The circumflex artery: The circumflex artery gave rise to a ramus  Dhiren Azimi, a marginal Keren Alverio, and 2 posterolateral branches. These vessels  were free of significant disease.  The right coronary artery: The right coronary artery is a moderate-  sized vessel and gave rise to a conus Cydney Alvarenga, ventricle Herma Uballe, a  posterior descending Sallyann Kinnaird, and a posterolateral Austan Nicholl. These vessels  were free of significant disease.  The left ventriculogram: The left ventriculogram performed on RAO  projection showed mild global hypokinesis. The estimated ejection  fraction was 45%.  The aortic pressure was 105/58 with a mean of 78 and left ventricular  pressure was 105/40.  CONCLUSION:  1. Normal coronary angiography.  2. Mild global hypokinesis with an estimated fraction of 45%.  RECOMMENDATIONS: The patient has no source of ischemia to account for  her recent exertional chest pain. Her LV function by angiography is  similar to what the measurements were by echocardiography. We will plan  to followup with Dr. Graciela HusbandsKlein to who can decided regarding further  evaluation of chest pain.  01/2014 Echo LVEF 50-55%, mod to severe LAE, mod to severe MR      Assessment and Plan   1. History of tachycardia induced CM - LVEF low normal by last echo, symptoms of SOB/DOE improved with increased lasix - continue beta blocker, ACE-I, diuretic  2. Mitral regurgitation - [facility], films reviewed. MR in the moderate to severe range based on TTE, she has low nomral LVEF, LVIDs 4.4, and moderate pulm HTN with PASP 54 mmHg  along with symptoms of CHF. On imaging the regurgitant jet is eccentric and posterior, concern this may actually be severe MR based on symptoms and secondary cardiac changes. TR appears to be at least mild to moderate as well.  - obtain TEE.   3. Permanent pacemaker - normal function on last device check, no current symptoms. Continue to follow.    F/u 6 weeks  Antoine PocheJonathan F. Larae Caison, M.D.

## 2014-02-24 ENCOUNTER — Ambulatory Visit (INDEPENDENT_AMBULATORY_CARE_PROVIDER_SITE_OTHER): Payer: PRIVATE HEALTH INSURANCE | Admitting: Cardiology

## 2014-02-24 ENCOUNTER — Encounter: Payer: Self-pay | Admitting: Cardiology

## 2014-02-24 ENCOUNTER — Encounter: Payer: Self-pay | Admitting: *Deleted

## 2014-02-24 VITALS — BP 107/74 | HR 69 | Ht 65.0 in | Wt 224.0 lb

## 2014-02-24 DIAGNOSIS — I340 Nonrheumatic mitral (valve) insufficiency: Secondary | ICD-10-CM

## 2014-02-24 DIAGNOSIS — R0602 Shortness of breath: Secondary | ICD-10-CM

## 2014-02-24 NOTE — Patient Instructions (Signed)
Your physician has requested that you have a TEE. During a TEE, sound waves are used to create images of your heart. It provides your doctor with information about the size and shape of your heart and how well your heart's chambers and valves are working. In this test, a transducer is attached to the end of a flexible tube that's guided down your throat and into your esophagus (the tube leading from you mouth to your stomach) to get a more detailed image of your heart. You are not awake for the procedure. Please see the instruction sheet given to you today. For further information please visit [email or web address] Continue all current medications. Follow up in  6 weeks

## 2014-03-02 ENCOUNTER — Telehealth: Payer: Self-pay | Admitting: *Deleted

## 2014-03-02 NOTE — Telephone Encounter (Signed)
TEE  Received: Today   Gershon Mussel, LPN    Need to change that TEE to 11:15. Patient to register @ 10:15.   Thanks,  [employer]

## 2014-03-02 NOTE — Telephone Encounter (Signed)
-----   Message from Ashley Akin sent at 03/02/2014  1:50 PM EST ----- Regarding: RE: schedule TEE  Patient scheduled for 0930 on 03/19/14.  Needs to arrive @ [facility] @ 0830.  Thanks, Aurther Loft ----- Message -----    From: Lesle Chris, LPN    Sent: [id number]   4:26 PM      To: Ashley Akin Subject: schedule TEE                                   Please schedule TEE for mitral regurgitation with Dr. Wyline Mood for Thursday, 03/19/14.  Order is in.     Thanks, Inocencio Homes

## 2014-03-03 ENCOUNTER — Telehealth: Payer: Self-pay | Admitting: Cardiovascular Disease

## 2014-03-03 NOTE — Telephone Encounter (Signed)
    TEE scheduled for Thursday, 03/19/14 at 11:15 - [facility].  CHECKING PERCERT

## 2014-03-03 NOTE — Telephone Encounter (Signed)
Patient notified via voice mail per her request.  She works nights so she is sleeping during the day.

## 2014-03-19 ENCOUNTER — Encounter (HOSPITAL_COMMUNITY): Payer: Self-pay | Admitting: *Deleted

## 2014-03-19 ENCOUNTER — Ambulatory Visit (HOSPITAL_COMMUNITY): Admission: RE | Admit: 2014-03-19 | Payer: PRIVATE HEALTH INSURANCE | Source: Ambulatory Visit

## 2014-03-19 ENCOUNTER — Ambulatory Visit (HOSPITAL_COMMUNITY)
Admission: RE | Admit: 2014-03-19 | Discharge: 2014-03-19 | Disposition: A | Discharge status: Home / Self Care | Payer: PRIVATE HEALTH INSURANCE | Source: Ambulatory Visit | Attending: Cardiology | Admitting: Cardiology

## 2014-03-19 ENCOUNTER — Encounter (HOSPITAL_COMMUNITY)
Admission: RE | Disposition: A | Discharge status: Home / Self Care | Payer: Self-pay | Source: Ambulatory Visit | Attending: Cardiology

## 2014-03-19 ENCOUNTER — Other Ambulatory Visit (HOSPITAL_COMMUNITY): Payer: PRIVATE HEALTH INSURANCE

## 2014-03-19 DIAGNOSIS — I442 Atrioventricular block, complete: Secondary | ICD-10-CM | POA: Diagnosis not present

## 2014-03-19 DIAGNOSIS — I340 Nonrheumatic mitral (valve) insufficiency: Secondary | ICD-10-CM

## 2014-03-19 DIAGNOSIS — I369 Nonrheumatic tricuspid valve disorder, unspecified: Secondary | ICD-10-CM

## 2014-03-19 DIAGNOSIS — Z7982 Long term (current) use of aspirin: Secondary | ICD-10-CM | POA: Diagnosis not present

## 2014-03-19 DIAGNOSIS — Z950 Presence of cardiac pacemaker: Secondary | ICD-10-CM | POA: Insufficient documentation

## 2014-03-19 DIAGNOSIS — I4891 Unspecified atrial fibrillation: Secondary | ICD-10-CM | POA: Diagnosis not present

## 2014-03-19 DIAGNOSIS — I5032 Chronic diastolic (congestive) heart failure: Secondary | ICD-10-CM | POA: Diagnosis not present

## 2014-03-19 DIAGNOSIS — I272 Other secondary pulmonary hypertension: Secondary | ICD-10-CM | POA: Insufficient documentation

## 2014-03-19 HISTORY — DX: Other specified postprocedural states: Z98.890

## 2014-03-19 HISTORY — DX: Major depressive disorder, single episode, unspecified: F32.9

## 2014-03-19 HISTORY — DX: Gastro-esophageal reflux disease without esophagitis: K21.9

## 2014-03-19 HISTORY — DX: Reserved for inherently not codable concepts without codable children: IMO0001

## 2014-03-19 HISTORY — DX: Anemia, unspecified: D64.9

## 2014-03-19 HISTORY — DX: Depression, unspecified: F32.A

## 2014-03-19 HISTORY — PX: TEE WITHOUT CARDIOVERSION: SHX5443

## 2014-03-19 SURGERY — ECHOCARDIOGRAM, TRANSESOPHAGEAL
Anesthesia: Moderate Sedation

## 2014-03-19 MED ORDER — SODIUM CHLORIDE 0.9 % IV SOLN
INTRAVENOUS | Status: DC
  Administered 2014-03-19: 11:00:00 via INTRAVENOUS

## 2014-03-19 MED ORDER — LIDOCAINE VISCOUS 2 % MT SOLN
OROMUCOSAL | Status: DC | PRN
  Administered 2014-03-19: 20 mL via OROMUCOSAL

## 2014-03-19 MED ORDER — FENTANYL CITRATE 0.05 MG/ML IJ SOLN
INTRAMUSCULAR | Status: AC
  Filled 2014-03-19: qty 2

## 2014-03-19 MED ORDER — LIDOCAINE VISCOUS 2 % MT SOLN
OROMUCOSAL | Status: AC
  Filled 2014-03-19: qty 15

## 2014-03-19 MED ORDER — ONDANSETRON HCL 4 MG/2ML IJ SOLN
4.0000 mg | Freq: Once | INTRAMUSCULAR | Status: AC
  Administered 2014-03-19: 4 mg via INTRAVENOUS

## 2014-03-19 MED ORDER — MIDAZOLAM HCL 5 MG/5ML IJ SOLN
INTRAMUSCULAR | Status: AC
  Filled 2014-03-19: qty 5

## 2014-03-19 MED ORDER — FENTANYL CITRATE 0.05 MG/ML IJ SOLN
INTRAMUSCULAR | Status: DC | PRN
  Administered 2014-03-19 (×5): 25 ug via INTRAVENOUS

## 2014-03-19 MED ORDER — BUTAMBEN-TETRACAINE-BENZOCAINE 2-2-14 % EX AERO
INHALATION_SPRAY | CUTANEOUS | Status: DC | PRN
  Administered 2014-03-19: 2 via TOPICAL

## 2014-03-19 MED ORDER — SODIUM CHLORIDE BACTERIOSTATIC 0.9 % IJ SOLN
INTRAMUSCULAR | Status: AC
  Filled 2014-03-19: qty 20

## 2014-03-19 MED ORDER — MIDAZOLAM HCL 5 MG/5ML IJ SOLN
INTRAMUSCULAR | Status: DC | PRN
  Administered 2014-03-19: 0.5 mg via INTRAVENOUS
  Administered 2014-03-19: 1 mg via INTRAVENOUS
  Administered 2014-03-19 (×4): 0.5 mg via INTRAVENOUS
  Administered 2014-03-19: 2 mg via INTRAVENOUS

## 2014-03-19 MED ORDER — ONDANSETRON HCL 4 MG/2ML IJ SOLN
INTRAMUSCULAR | Status: AC
  Filled 2014-03-19: qty 2

## 2014-03-19 NOTE — H&P (Signed)
Procedure H&P please refer to recent clinic note for full details. TEE for mitral regurgitation   Clinical Summary Ms. Heinisch is a 53 y.o.female seen today for follow up of the following medical problems.   1. History of tachycardia induced cardiomyopathy - s/p AV nodal ablation, now with permanent pacemaker - last visit patient reported worsening SOB and DOE, weight gain, and increased LE edema. Her lasix was increased to 40mg  bid, since then weight has trended down and symptoms have improved. She also has anemia, and recently received IV iron and B12 which also improved her SOB - since last visit repeated echo 01/2014 (done at [facility], disc indepedently reviewed), mildly dilated LV (LVIDs 4.4), LVEF 50-55%, mod to severe LAE, mod to severe MR, PASP 54   2. Permanent pacemaker - device check 09/2013 normal function - denies any lightheadedness or dizziness  3. Mitral regurgitation - moderate to severe by last echo 01/2014 - SOB and edema have improved since increasing lasix.   4. Anemia - symptoms improved with IV iron, B12 injection by pcp Past Medical History  Diagnosis Date  . Chronic diastolic heart failure     EF 37-62%  . Complete heart block     s/p AV nodal ablation at [facility]  . Atrial fibrillation   . Mitral valvular regurgitation   . Pacemaker- medtronic     Initial PM 1995; generator 2009, infection with contralateral implant 2010     No Known Allergies   Current Outpatient Prescriptions  Medication Sig Dispense Refill  . aspirin 81 MG tablet Take 1 tablet (81 mg total) by mouth daily. 30 tablet 3  . carvedilol (COREG) 6.25 MG tablet Take 6.25 mg by mouth 2 (two) times daily with a meal.    . enalapril (VASOTEC) 10 MG tablet Take one by mouth daily    . furosemide (LASIX) 40 MG tablet Take 1 tablet (40 mg total) by mouth 2 (two) times daily. 60 tablet 6  . omeprazole (PRILOSEC) 20 MG capsule Take 20 mg by  mouth daily.    . traZODone (DESYREL) 100 MG tablet Take 100 mg by mouth at bedtime.     No current facility-administered medications for this visit.     Past Surgical History  Procedure Laterality Date  . Cardiac pacemaker placement  1995    [facility],Medtronic Adapta ADDRL1  . Cardiac catheterization  09/11/2007  . Gastric bypass  09/2003  . Abdominal adhesion surgery  04/2006     No Known Allergies    No family history on file.   Social History Ms. Shein reports that she has never smoked. She has never used smokeless tobacco. Ms. Crowel reports that she does not drink alcohol.   Review of Systems CONSTITUTIONAL: No weight loss, fever, chills, weakness or fatigue.  HEENT: Eyes: No visual loss, blurred vision, double vision or yellow sclerae.No hearing loss, sneezing, congestion, runny nose or sore throat.  SKIN: No rash or itching.  CARDIOVASCULAR: per HPI RESPIRATORY: No shortness of breath, cough or sputum.  GASTROINTESTINAL: No anorexia, nausea, vomiting or diarrhea. No abdominal pain or blood.  GENITOURINARY: No burning on urination, no polyuria NEUROLOGICAL: No headache, dizziness, syncope, paralysis, ataxia, numbness or tingling in the extremities. No change in bowel or bladder control.  MUSCULOSKELETAL: No muscle, back pain, joint pain or stiffness.  LYMPHATICS: No enlarged nodes. No history of splenectomy.  PSYCHIATRIC: No history of depression or anxiety.  ENDOCRINOLOGIC: No reports of sweating, cold or heat intolerance. No polyuria or  polydipsia.  Marland Kitchen.   Physical Examination p 69 bp 107/74 Wt 224 lbs BMI 37 Gen: resting comfortably, no acute distress HEENT: no scleral icterus, pupils equal round and reactive, no palptable cervical adenopathy,  CV: RRR, 2/6 systolic murmur at apex, no JVD, no carotid bruits Resp: Clear to auscultation bilaterally GI: abdomen is soft, non-tender, non-distended,  normal bowel sounds, no hepatosplenomegaly MSK: extremities are warm, no edema.  Skin: warm, no rash Neuro: no focal deficits Psych: appropriate affect   Diagnostic Studies 09/2007 Cath RESULTS: Left main coronary artery: The left main coronary artery was  free of any disease.  Left anterior descending artery: Left anterior descending artery gave  rise to 3 septal perforator and diagonal Laiyla Slagel. These in the LAD  proper were free of significant disease.  The circumflex artery: The circumflex artery gave rise to a ramus  Juliano Mceachin, a marginal Latif Nazareno, and 2 posterolateral branches. These vessels  were free of significant disease.  The right coronary artery: The right coronary artery is a moderate-  sized vessel and gave rise to a conus Keaunna Skipper, ventricle Simrin Vegh, a  posterior descending Laquinton Bihm, and a posterolateral Dosha Broshears. These vessels  were free of significant disease.  The left ventriculogram: The left ventriculogram performed on RAO  projection showed mild global hypokinesis. The estimated ejection  fraction was 45%.  The aortic pressure was 105/58 with a mean of 78 and left ventricular  pressure was 105/40.  CONCLUSION:  1. Normal coronary angiography.  2. Mild global hypokinesis with an estimated fraction of 45%.  RECOMMENDATIONS: The patient has no source of ischemia to account for  her recent exertional chest pain. Her LV function by angiography is  similar to what the measurements were by echocardiography. We will plan  to followup with Dr. Graciela HusbandsKlein to who can decided regarding further  evaluation of chest pain.  01/2014 Echo LVEF 50-55%, mod to severe LAE, mod to severe MR      Assessment and Plan   1. History of tachycardia induced CM - LVEF low normal by last echo, symptoms of SOB/DOE improved with increased lasix - continue beta blocker, ACE-I, diuretic  2. Mitral regurgitation - recent [facility], films reviewed. MR in the  moderate to severe range based on TTE, she has low nomral LVEF, LVIDs 4.4, and moderate pulm HTN with PASP 54 mmHg along with symptoms of CHF. On imaging the regurgitant jet is eccentric and posterior, concern this may actually be severe MR based on symptoms and secondary cardiac changes. TR appears to be at least mild to moderate as well.  - obtain TEE.   3. Permanent pacemaker - normal function on last device check, no current symptoms. Continue to follow.    F/u 6 weeks  Antoine PocheJonathan F. Avyukth Bontempo, M.D.

## 2014-03-19 NOTE — Progress Notes (Signed)
  Echocardiogram Echocardiogram Transesophageal has been performed.  Abagayle Klutts 03/19/2014, 12:56 PM

## 2014-03-19 NOTE — Procedures (Signed)
Procedure: TEE Indication: Mitral regurgitation Physician: Dr [name] MD   Patient was brought to the endoscopy suite after risks and procedures were explained and appropriate consent obtained. The oropharynx was anesthesized with 103mL of 2% oral viscous lidocaine and cetacaine spray x 2 sprays. Moderate sedation was achieved with versed 5.5 mg, fentanyl , and 4mg  of IV zofran was given to prevent nausea. The TEE probe was intubated into the esophagus without difficulty, the patient tolerated the procedure well. Please refer to full TEE report for full findings. Preliminary shows mild to mod MR and mild to mod TR, normal LV systolic function.   Dominga Ferry MD

## 2014-03-19 NOTE — Discharge Instructions (Signed)
Gastrointestinal Endoscopy, Care After °Refer to this sheet in the next few weeks. These instructions provide you with information on caring for yourself after your procedure. Your caregiver may also give you more specific instructions. Your treatment has been planned according to current medical practices, but problems sometimes occur. Call your caregiver if you have any problems or questions after your procedure. °HOME CARE INSTRUCTIONS °· If you were given medicine to help you relax (sedative), do not drive, operate machinery, or sign important documents for 24 hours. °· Avoid alcohol and hot or warm beverages for the first 24 hours after the procedure. °· Only take over-the-counter or prescription medicines for pain, discomfort, or fever as directed by your caregiver. You may resume taking your normal medicines unless your caregiver tells you otherwise. Ask your caregiver when you may resume taking medicines that may cause bleeding, such as aspirin, clopidogrel, or warfarin. °· You may return to your normal diet and activities on the day after your procedure, or as directed by your caregiver. Walking may help to reduce any bloated feeling in your abdomen. °· Drink enough fluids to keep your urine clear or pale yellow. °· You may gargle with salt water if you have a sore throat. °SEEK IMMEDIATE MEDICAL CARE IF: °· You have severe nausea or vomiting. °· You have severe abdominal pain, abdominal cramps that last longer than 6 hours, or abdominal swelling (distention). °· You have severe shoulder or back pain. °· You have trouble swallowing. °· You have shortness of breath, your breathing is shallow, or you are breathing faster than normal. °· You have a fever or a rapid heartbeat. °· You vomit blood or material that looks like coffee grounds. °· You have bloody, black, or tarry stools. °MAKE SURE YOU: °· Understand these instructions. °· Will watch your condition. °· Will get help right away if you are not doing  well or get worse. °Document Released: 11/09/2003 Document Revised: 08/11/2013 Document Reviewed: 06/27/2011 °ExitCare® Patient Information ©2015 [employer]. This information is not intended to replace advice given to you by your health care provider. Make sure you discuss any questions you have with your health care provider. ° °

## 2014-03-20 ENCOUNTER — Encounter (HOSPITAL_COMMUNITY): Payer: Self-pay | Admitting: Cardiology

## 2014-03-30 ENCOUNTER — Encounter: Payer: Self-pay | Admitting: Cardiology

## 2014-03-31 ENCOUNTER — Encounter: Payer: Self-pay | Admitting: Cardiology

## 2014-03-31 ENCOUNTER — Telehealth: Payer: Self-pay | Admitting: *Deleted

## 2014-03-31 NOTE — Telephone Encounter (Signed)
Patient is inpatient at [facility], Room 325, and is having palpitations and syncope. Per Dr. Lona Kettle he is requesting an interrogation on patient's device. Nurse advised MD that medtronic would be contacted.

## 2014-03-31 NOTE — Telephone Encounter (Signed)
Called [employer] and spoke with Leta Jungling regarding request. Per Leta Jungling, someone would be able to go in about 3 hours.

## 2014-04-07 ENCOUNTER — Encounter: Payer: Self-pay | Admitting: *Deleted

## 2014-04-20 ENCOUNTER — Ambulatory Visit (INDEPENDENT_AMBULATORY_CARE_PROVIDER_SITE_OTHER): Payer: PRIVATE HEALTH INSURANCE | Admitting: Cardiology

## 2014-04-20 ENCOUNTER — Encounter: Payer: Self-pay | Admitting: Cardiology

## 2014-04-20 VITALS — BP 138/71 | HR 77 | Ht 65.0 in | Wt 221.0 lb

## 2014-04-20 DIAGNOSIS — Z950 Presence of cardiac pacemaker: Secondary | ICD-10-CM

## 2014-04-20 DIAGNOSIS — R55 Syncope and collapse: Secondary | ICD-10-CM

## 2014-04-20 DIAGNOSIS — I340 Nonrheumatic mitral (valve) insufficiency: Secondary | ICD-10-CM

## 2014-04-20 NOTE — Progress Notes (Signed)
Clinical Summary [name] [name] is a 54 y.o.female seen today for follow up of the following medical problems.   1. History of tachycardia induced cardiomyopathy - s/p AV nodal ablation, now with permanent pacemaker -  echo 01/2014 (done at [facility], disc indepedently reviewed), mildly dilated LV (LVIDs 4.4), LVEF 50-55%, mod to severe LAE, mod to severe MR, PASP 54. Diastolic function not described - had some increased SOB and LE edema over the last few visits that improved with increased lasix  2. Permanent pacemaker - device check 09/2013 normal function - denies any lightheadedness or dizziness  3. Mitral regurgitation - moderate to severe by last echo 01/2014. TEE 03/2014 mild to moderate MR - SOB and edema have improved since increasing lasix.   4. Anemia - symptoms improved with IV iron, B12 injection by pcp - blood counts have normalized. Followed by pcp  5. Syncope - admitted 03/2014 to [facility] - occurded while at work. Sudden onset of feeling very hot all over, mild nausea. 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 Sat down and put head down, short loss of conscioussness. No chest pain, no palpitations. - - from discharge notes normal orthostatics, pacemaker interrogation during admission was normal. Thought to be vasovagal syncope due to described prodrome. Reports high levels of stress at that the time that were work related.   Past Medical History  Diagnosis Date  . Chronic diastolic heart failure     EF 48-27%  . Atrial fibrillation   . Mitral valvular regurgitation   . Pacemaker- medtronic     Initial PM 1995; generator 2009, infection with contralateral implant 2010  . S/P AV nodal ablation   . Shortness of breath dyspnea   . GERD (gastroesophageal reflux disease)   . Depression     history of depression in the past.  . Anemia      No Known Allergies   Current Outpatient Prescriptions  Medication Sig Dispense Refill  . aspirin 81 MG tablet Take 1  tablet (81 mg total) by mouth daily. 30 tablet 3  . carvedilol (COREG) 6.25 MG tablet Take 6.25 mg by mouth 2 (two) times daily with a meal.    . cyanocobalamin (,VITAMIN B-12,) 1000 MCG/ML injection Inject 1,000 mcg into the muscle every 30 (thirty) days.     . enalapril (VASOTEC) 10 MG tablet Take one by mouth daily    . furosemide (LASIX) 40 MG tablet Take 1 tablet (40 mg total) by mouth 2 (two) times daily. 60 tablet 6  . omeprazole (PRILOSEC) 20 MG capsule Take 20 mg by mouth daily.    . traZODone (DESYREL) 100 MG tablet Take 100 mg by mouth at bedtime.     No current facility-administered medications for this visit.     Past Surgical History  Procedure Laterality Date  . Cardiac pacemaker placement  1995    [facility],Medtronic Adapta ADDRL1  . Cardiac catheterization  09/11/2007  . Gastric bypass  09/2003  . Abdominal adhesion surgery  04/2006  . Cataract extraction, bilateral    . Tee without cardioversion N/A 03/19/2014    Procedure: TRANSESOPHAGEAL ECHOCARDIOGRAM (TEE);  Surgeon: Antoine Poche, MD;  Location: [facility];  Service: Endoscopy;  Laterality: N/A;     No Known Allergies    No family history on file.   Social History Ms. Woodle reports that she has never smoked. She has never used smokeless tobacco. Ms. Depies reports that she drinks about 4.2 oz of alcohol  per week.   Review of Systems CONSTITUTIONAL: No weight loss, fever, chills, weakness or fatigue.  HEENT: Eyes: No visual loss, blurred vision, double vision or yellow sclerae.No hearing loss, sneezing, congestion, runny nose or sore throat.  SKIN: No rash or itching.  CARDIOVASCULAR: per HPI RESPIRATORY: No shortness of breath, cough or sputum.  GASTROINTESTINAL: No anorexia, nausea, vomiting or diarrhea. No abdominal pain or blood.  GENITOURINARY: No burning on urination, no polyuria NEUROLOGICAL: No current headache, dizziness, syncope, paralysis, ataxia, numbness or  tingling in the extremities. No change in bowel or bladder control.  MUSCULOSKELETAL: No muscle, back pain, joint pain or stiffness.  LYMPHATICS: No enlarged nodes. No history of splenectomy.  PSYCHIATRIC: No history of depression or anxiety.  ENDOCRINOLOGIC: No reports of sweating, cold or heat intolerance. No polyuria or polydipsia.  Marland Kitchen   Physical Examination p 77 bp 138/71 Wt 221 lbs BMI 37 Gen: resting comfortably, no acute distress HEENT: no scleral icterus, pupils equal round and reactive, no palptable cervical adenopathy,  CV: RRR, 2/6 systolic murmur at apex, no JVD Resp: Clear to auscultation bilaterally GI: abdomen is soft, non-tender, non-distended, normal bowel sounds, no hepatosplenomegaly MSK: extremities are warm, no edema.  Skin: warm, no rash Neuro:  no focal deficits Psych: appropriate affect      Assessment and Plan  1. History of tachycardia induced CM - LVEF improved to low normal by last echo, symptoms of SOB/DOE improved with increased lasix. Currently with chronic CHF with preserved LVEF.  - continue current meds  2. Mitral regurgitation - moderate to severe by TTE, mild to moderate by TEE - continue to follow clinically  3. Permanent pacemaker - normal function on last device check, no current symptoms. Continue to follow.   4. Syncope - likely vasovagal syncope, no recurrent episodes. Continue to follow clinically  5. Anemia - per pcp  F/u 6 months  Antoine Poche, M.D.,

## 2014-04-20 NOTE — Patient Instructions (Signed)

## 2014-05-08 ENCOUNTER — Encounter: Payer: Self-pay | Admitting: *Deleted

## 2014-06-04 ENCOUNTER — Encounter: Payer: Self-pay | Admitting: *Deleted

## 2014-06-11 ENCOUNTER — Telehealth: Payer: Self-pay | Admitting: Internal Medicine

## 2014-06-11 NOTE — Telephone Encounter (Signed)
New message     FYI Pt was in the hosp in dec and a medtronic rep checked her pacemaker.  She got a letter stating we are not getting her transmissions.  She said she will upgrade her box within the next few days/weeks.

## 2014-06-12 ENCOUNTER — Telehealth: Payer: Self-pay | Admitting: Internal Medicine

## 2014-06-12 NOTE — Telephone Encounter (Signed)
Spoke w/ pt and provided her with serial number to device.

## 2014-06-12 NOTE — Telephone Encounter (Signed)
Spoke w/ pt and informed her that she received letter b/c we have not received any transmission for PPM since June 2015. Pt stated that she was in [facility] in [place] and a Medtr[employer] rep came and checked her PPM at that time and it was. Pt stated that she has been seeing Dr. Wyline Mood in the [facility] office and she does not feel the need to see Dr. Graciela Husbands at this time. I informed pt that it is our protocol that she be seen by MD at least once a year and that is coming up in March 2016. Pt denied to make an appt w/ MD but agreed to schedule a remote transmission for May 2016. Pt will order wirex today and she should have it by remote appt in May.

## 2014-06-12 NOTE — Telephone Encounter (Signed)
New message      Need serial number for her pacemaker.  She received a new box and it is asking for the serial number.

## 2014-07-09 ENCOUNTER — Encounter: Payer: Self-pay | Admitting: *Deleted

## 2014-08-05 ENCOUNTER — Encounter: Payer: Self-pay | Admitting: *Deleted

## 2014-08-10 ENCOUNTER — Encounter: Payer: PRIVATE HEALTH INSURANCE | Admitting: *Deleted

## 2014-08-10 ENCOUNTER — Telehealth: Payer: Self-pay | Admitting: Cardiology

## 2014-08-10 NOTE — Telephone Encounter (Signed)
Spoke with pt and reminded pt of remote transmission that is due today. Pt verbalized understanding.   

## 2014-08-11 ENCOUNTER — Encounter: Payer: Self-pay | Admitting: Cardiology

## 2014-09-11 ENCOUNTER — Encounter: Payer: Self-pay | Admitting: *Deleted

## 2014-09-14 ENCOUNTER — Ambulatory Visit (INDEPENDENT_AMBULATORY_CARE_PROVIDER_SITE_OTHER): Payer: PRIVATE HEALTH INSURANCE | Admitting: *Deleted

## 2014-09-14 DIAGNOSIS — I442 Atrioventricular block, complete: Secondary | ICD-10-CM

## 2014-09-15 NOTE — Progress Notes (Signed)
Remote pacemaker transmission.   

## 2014-09-19 LAB — CUP PACEART REMOTE DEVICE CHECK
Battery Impedance: 443 Ohm
Battery Remaining Longevity: 85 mo
Battery Voltage: 2.79 V
Brady Statistic AP VP Percent: 12 %
Brady Statistic AP VS Percent: 0 %
Brady Statistic AS VP Percent: 88 %
Brady Statistic AS VS Percent: 0 %
Date Time Interrogation Session: 20160606145940
Lead Channel Impedance Value: 386 Ohm
Lead Channel Impedance Value: 535 Ohm
Lead Channel Pacing Threshold Amplitude: 1 V
Lead Channel Pacing Threshold Pulse Width: 0.4 ms
Lead Channel Sensing Intrinsic Amplitude: 1.4 mV
Lead Channel Setting Pacing Amplitude: 2 V
Lead Channel Setting Pacing Amplitude: 2.5 V
Lead Channel Setting Pacing Pulse Width: 0.4 ms
Lead Channel Setting Sensing Sensitivity: 2 mV

## 2014-09-29 ENCOUNTER — Telehealth: Payer: Self-pay | Admitting: *Deleted

## 2014-09-29 NOTE — Telephone Encounter (Signed)
LMOM on home machine regarding recall- overdue to see SK.

## 2014-09-30 NOTE — Telephone Encounter (Signed)
Patient returning call- explained importance of follow up with EP as well as general cardiology and device interrogations while hospitalized. Patient verbalizes understanding. Scheduled to see Dr. Graciela Husbands 12/15/14 at 1:30pm.

## 2014-10-13 ENCOUNTER — Encounter: Payer: Self-pay | Admitting: Internal Medicine

## 2014-11-05 ENCOUNTER — Other Ambulatory Visit: Payer: Self-pay | Admitting: Cardiology

## 2014-11-06 ENCOUNTER — Other Ambulatory Visit: Payer: Self-pay | Admitting: *Deleted

## 2014-11-06 MED ORDER — FUROSEMIDE 40 MG PO TABS: 40.0000 mg | ORAL_TABLET | Freq: Two times a day (BID) | ORAL | Status: DC

## 2014-12-15 ENCOUNTER — Ambulatory Visit (INDEPENDENT_AMBULATORY_CARE_PROVIDER_SITE_OTHER): Payer: PRIVATE HEALTH INSURANCE | Admitting: Internal Medicine

## 2014-12-15 ENCOUNTER — Encounter: Payer: Self-pay | Admitting: Internal Medicine

## 2014-12-15 VITALS — BP 112/80 | HR 64 | Ht 65.0 in | Wt 194.2 lb

## 2014-12-15 DIAGNOSIS — I442 Atrioventricular block, complete: Secondary | ICD-10-CM | POA: Diagnosis not present

## 2014-12-15 DIAGNOSIS — Z45018 Encounter for adjustment and management of other part of cardiac pacemaker: Secondary | ICD-10-CM | POA: Diagnosis not present

## 2014-12-15 LAB — CUP PACEART INCLINIC DEVICE CHECK
Battery Impedance: 491 Ohm
Battery Remaining Longevity: 82 mo
Battery Voltage: 2.79 V
Brady Statistic AP VP Percent: 12 %
Brady Statistic AP VS Percent: 0 %
Brady Statistic AS VP Percent: 88 %
Brady Statistic AS VS Percent: 0 %
Date Time Interrogation Session: 20160906171541
Lead Channel Impedance Value: 417 Ohm
Lead Channel Impedance Value: 512 Ohm
Lead Channel Pacing Threshold Amplitude: 0.5 V
Lead Channel Pacing Threshold Amplitude: 0.75 V
Lead Channel Pacing Threshold Pulse Width: 0.4 ms
Lead Channel Pacing Threshold Pulse Width: 0.4 ms
Lead Channel Sensing Intrinsic Amplitude: 1 mV
Lead Channel Sensing Intrinsic Amplitude: 4 mV
Lead Channel Setting Pacing Amplitude: 2 V
Lead Channel Setting Pacing Amplitude: 2.5 V
Lead Channel Setting Pacing Pulse Width: 0.4 ms
Lead Channel Setting Sensing Sensitivity: 2 mV

## 2014-12-15 MED ORDER — ENALAPRIL MALEATE 10 MG PO TABS: 10.0000 mg | ORAL_TABLET | Freq: Every day | ORAL | Status: DC

## 2014-12-15 MED ORDER — FUROSEMIDE 40 MG PO TABS: 40.0000 mg | ORAL_TABLET | Freq: Two times a day (BID) | ORAL | Status: DC

## 2014-12-15 NOTE — Patient Instructions (Signed)
Medication Instructions:  Your physician recommends that you continue on your current medications as directed. Please refer to the Current Medication list given to you today.  Labwork: None ordered  Testing/Procedures: None ordered  Follow-Up: Remote monitoring is used to monitor your Pacemaker of ICD from home. This monitoring reduces the number of office visits required to check your device to one time per year. It allows [place] to keep an eye on the functioning of your device to ensure it is working properly. You are scheduled for a device check from home on 03/16/15. You may send your transmission at any time that day. If you have a wireless device, the transmission will be sent automatically. After your physician reviews your transmission, you will receive a postcard with your next transmission date.  Your physician wants you to follow-up in: 1 year with Dr. Graciela Husbands.  You will receive a reminder letter in the mail two months in advance. If you don't receive a letter, please call our office to schedule the follow-up appointment.  Any Other Special Instructions Will Be Listed Below (If Applicable). Thank you for choosing [facility]!!   Dory Horn, RN [phone]

## 2014-12-15 NOTE — Progress Notes (Signed)
Patient Care Team: Estanislado Pandy, MD as PCP - General (Cardiology) [name], MD (Cardiology) Hillis Range, MD (Cardiology)   HPI  [name] [name] is a 54 y.o. 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 She underwent generator replacement in April of 2008.  Marland Kitchen  Her dyspnea is much improved following up titration of her diuretics. She had a syncopal episode in December. She saw Dr. Lavella Lemons  he undertook an echocardiogram to assess her mitral valve  regurgitation was felt to be mild.  Ejection fraction was somewhat improved   compare with 2009 at 55%. There is been gradual incremental improvement ever since her AV ablation in the 1990s .Marland Kitchen  She has some peripheral edema. She has not had any palpitations.    Past Medical History  Diagnosis Date  . Chronic diastolic heart failure     EF 53-74%  . Atrial fibrillation   . Mitral valvular regurgitation   . Pacemaker- medtronic     Initial PM 1995; generator 2009, infection with contralateral implant 2010  . S/P AV nodal ablation   . Shortness of breath dyspnea   . GERD (gastroesophageal reflux disease)   . Depression     history of depression in the past.  . Anemia     Past Surgical History  Procedure Laterality Date  . Cardiac pacemaker placement  1995    [facility],Medtronic Adapta ADDRL1  . Cardiac catheterization  09/11/2007  . Gastric bypass  09/2003  . Abdominal adhesion surgery  04/2006  . Cataract extraction, bilateral    . Tee without cardioversion N/A 03/19/2014    Procedure: TRANSESOPHAGEAL ECHOCARDIOGRAM (TEE);  Surgeon: Antoine Poche, MD;  Location: [facility];  Service: Endoscopy;  Laterality: N/A;    Current Outpatient Prescriptions  Medication Sig Dispense Refill  . aspirin 81 MG tablet Take 1 tablet (81 mg total) by mouth daily. 30 tablet 3  . carvedilol (COREG) 6.25 MG tablet Take 6.25 mg by mouth 2  (two) times daily with a meal.    . clonazePAM (KLONOPIN) 0.5 MG tablet Take 0.5 mg by mouth 3 (three) times daily as needed for anxiety.     . cyanocobalamin (,VITAMIN B-12,) 1000 MCG/ML injection Inject 1,000 mcg into the muscle every 30 (thirty) days.     . enalapril (VASOTEC) 10 MG tablet Take one by mouth daily    . furosemide (LASIX) 40 MG tablet Take 1 tablet (40 mg total) by mouth 2 (two) times daily. 60 tablet 0  . omeprazole (PRILOSEC) 20 MG capsule Take 20 mg by mouth daily.    . sertraline (ZOLOFT) 50 MG tablet Take 50 mg by mouth at bedtime.    . traZODone (DESYREL) 100 MG tablet Take 100 mg by mouth at bedtime.     No current facility-administered medications for this visit.    No Known Allergies  Review of Systems negative except from HPI and PMH  Physical Exam BP 112/80 mmHg  Pulse 64  Ht 5\' 5"  (1.651 m)  Wt 194 lb 3.2 oz (88.089 kg)  BMI 32.32 kg/m2 Well developed and well nourished in no acute distress HENT normal E scleral and icterus clear Neck Supple JVP flat; carotids brisk and full Clear to ausculation  Regular rate and rhythm, 2/6 murmurs gallops or rub Soft with active bowel sounds No clubbing cyanosis Trace Edema Alert and oriented, grossly normal motor and  sensory function Skin Warm and Dry  ECG demonstrates atrial rhythm at a rate of about 100 beats per minute with complete heart block asynchronous ventricular pacing  Assessment and  Plan  Complete heart block status post AV junction ablation    Cardiomyopathy-nonischemic ejection fraction 50-55    HFpEF/ chronic  .   Pacemaker  medtronic    Atrial tachy    Syncope  Mitral regurgitation-mild      no recurrent syncope.  Relatively euvolemic.  Normal pacemaker function.  Continue current medications.

## 2014-12-23 ENCOUNTER — Encounter: Payer: Self-pay | Admitting: Internal Medicine

## 2015-03-16 ENCOUNTER — Ambulatory Visit (INDEPENDENT_AMBULATORY_CARE_PROVIDER_SITE_OTHER): Payer: PRIVATE HEALTH INSURANCE | Admitting: *Deleted

## 2015-03-16 ENCOUNTER — Telehealth: Payer: Self-pay | Admitting: Cardiology

## 2015-03-16 DIAGNOSIS — I442 Atrioventricular block, complete: Secondary | ICD-10-CM | POA: Diagnosis not present

## 2015-03-16 NOTE — Telephone Encounter (Signed)
Spoke with pt and reminded pt of remote transmission that is due today. Pt verbalized understanding.   

## 2015-03-17 NOTE — Progress Notes (Signed)
Remote pacemaker transmission.   

## 2015-03-25 LAB — CUP PACEART REMOTE DEVICE CHECK
Battery Impedance: 540 Ohm
Battery Remaining Longevity: 79 mo
Battery Voltage: 2.78 V
Brady Statistic AP VP Percent: 11 %
Brady Statistic AP VS Percent: 0 %
Brady Statistic AS VP Percent: 89 %
Brady Statistic AS VS Percent: 0 %
Date Time Interrogation Session: 20161207014207
Implantable Lead Implant Date: 20100201
Implantable Lead Implant Date: 20100201
Implantable Lead Location: 753859
Implantable Lead Location: 753860
Implantable Lead Model: 5076
Implantable Lead Model: 5076
Lead Channel Impedance Value: 406 Ohm
Lead Channel Impedance Value: 553 Ohm
Lead Channel Pacing Threshold Amplitude: 1 V
Lead Channel Pacing Threshold Pulse Width: 0.4 ms
Lead Channel Sensing Intrinsic Amplitude: 1 mV
Lead Channel Setting Pacing Amplitude: 2 V
Lead Channel Setting Pacing Amplitude: 2.5 V
Lead Channel Setting Pacing Pulse Width: 0.4 ms
Lead Channel Setting Sensing Sensitivity: 2 mV

## 2015-03-31 ENCOUNTER — Encounter: Payer: Self-pay | Admitting: Cardiology

## 2015-06-15 ENCOUNTER — Ambulatory Visit: Payer: PRIVATE HEALTH INSURANCE | Admitting: *Deleted

## 2015-06-18 ENCOUNTER — Encounter: Payer: Self-pay | Admitting: Cardiology

## 2015-07-02 ENCOUNTER — Ambulatory Visit (INDEPENDENT_AMBULATORY_CARE_PROVIDER_SITE_OTHER): Payer: PRIVATE HEALTH INSURANCE | Admitting: *Deleted

## 2015-07-02 DIAGNOSIS — Z950 Presence of cardiac pacemaker: Secondary | ICD-10-CM

## 2015-07-02 DIAGNOSIS — I442 Atrioventricular block, complete: Secondary | ICD-10-CM

## 2015-07-05 NOTE — Progress Notes (Signed)
Remote pacemaker transmission.   

## 2015-07-30 ENCOUNTER — Encounter: Payer: Self-pay | Admitting: Cardiology

## 2015-07-30 LAB — CUP PACEART REMOTE DEVICE CHECK
Battery Impedance: 638 Ohm
Battery Remaining Longevity: 73 mo
Battery Voltage: 2.78 V
Brady Statistic AP VP Percent: 11 %
Brady Statistic AP VS Percent: 0 %
Brady Statistic AS VP Percent: 89 %
Brady Statistic AS VS Percent: 0 %
Date Time Interrogation Session: 20170324182357
Implantable Lead Implant Date: 20100201
Implantable Lead Implant Date: 20100201
Implantable Lead Location: 753859
Implantable Lead Location: 753860
Implantable Lead Model: 5076
Implantable Lead Model: 5076
Lead Channel Impedance Value: 413 Ohm
Lead Channel Impedance Value: 512 Ohm
Lead Channel Pacing Threshold Amplitude: 0.875 V
Lead Channel Pacing Threshold Pulse Width: 0.4 ms
Lead Channel Sensing Intrinsic Amplitude: 1 mV
Lead Channel Setting Pacing Amplitude: 2 V
Lead Channel Setting Pacing Amplitude: 2.5 V
Lead Channel Setting Pacing Pulse Width: 0.4 ms
Lead Channel Setting Sensing Sensitivity: 2 mV

## 2015-10-04 ENCOUNTER — Ambulatory Visit (INDEPENDENT_AMBULATORY_CARE_PROVIDER_SITE_OTHER): Payer: PRIVATE HEALTH INSURANCE | Admitting: *Deleted

## 2015-10-04 ENCOUNTER — Telehealth: Payer: Self-pay | Admitting: Cardiology

## 2015-10-04 DIAGNOSIS — I442 Atrioventricular block, complete: Secondary | ICD-10-CM

## 2015-10-04 NOTE — Telephone Encounter (Signed)
LMOVM reminding pt to send remote transmission.   

## 2015-10-07 ENCOUNTER — Encounter: Payer: Self-pay | Admitting: Cardiology

## 2015-10-07 NOTE — Progress Notes (Signed)
Remote pacemaker transmission.   

## 2015-10-08 LAB — CUP PACEART REMOTE DEVICE CHECK
Battery Impedance: 689 Ohm
Battery Remaining Longevity: 70 mo
Battery Voltage: 2.78 V
Brady Statistic AP VP Percent: 10 %
Brady Statistic AP VS Percent: 0 %
Brady Statistic AS VP Percent: 90 %
Brady Statistic AS VS Percent: 0 %
Date Time Interrogation Session: 20170628143431
Implantable Lead Implant Date: 20100201
Implantable Lead Implant Date: 20100201
Implantable Lead Location: 753859
Implantable Lead Location: 753860
Implantable Lead Model: 5076
Implantable Lead Model: 5076
Lead Channel Impedance Value: 415 Ohm
Lead Channel Impedance Value: 544 Ohm
Lead Channel Pacing Threshold Amplitude: 0.75 V
Lead Channel Pacing Threshold Pulse Width: 0.4 ms
Lead Channel Sensing Intrinsic Amplitude: 0.7 mV
Lead Channel Setting Pacing Amplitude: 2 V
Lead Channel Setting Pacing Amplitude: 2.5 V
Lead Channel Setting Pacing Pulse Width: 0.4 ms
Lead Channel Setting Sensing Sensitivity: 2 mV

## 2015-10-11 ENCOUNTER — Other Ambulatory Visit: Payer: Self-pay | Admitting: Internal Medicine

## 2016-02-11 ENCOUNTER — Other Ambulatory Visit: Payer: Self-pay | Admitting: Internal Medicine

## 2016-03-30 ENCOUNTER — Encounter (INDEPENDENT_AMBULATORY_CARE_PROVIDER_SITE_OTHER): Payer: Self-pay

## 2016-03-30 ENCOUNTER — Encounter: Payer: Self-pay | Admitting: Internal Medicine

## 2016-03-30 ENCOUNTER — Ambulatory Visit (INDEPENDENT_AMBULATORY_CARE_PROVIDER_SITE_OTHER): Payer: PRIVATE HEALTH INSURANCE | Admitting: Internal Medicine

## 2016-03-30 VITALS — BP 102/80 | HR 63 | Ht 65.0 in | Wt 192.2 lb

## 2016-03-30 DIAGNOSIS — I442 Atrioventricular block, complete: Secondary | ICD-10-CM | POA: Diagnosis not present

## 2016-03-30 DIAGNOSIS — I5030 Unspecified diastolic (congestive) heart failure: Secondary | ICD-10-CM

## 2016-03-30 DIAGNOSIS — I428 Other cardiomyopathies: Secondary | ICD-10-CM | POA: Diagnosis not present

## 2016-03-30 DIAGNOSIS — Z950 Presence of cardiac pacemaker: Secondary | ICD-10-CM

## 2016-03-30 NOTE — Progress Notes (Signed)
Patient Care Team: Estanislado PandyPaul W Sasser, MD as PCP - General (Cardiology) Duke SalviaSteven C Epimenio Schetter, MD (Cardiology) Hillis RangeJames Allred, MD (Cardiology)   HPI  [name] [name] Akram is a 55 y.o. 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 She underwent generator replacement in April of 2008.  Marland Kitchen.  Her dyspnea is much improved following up titration of her diuretics. She had a syncopal episode in December. She saw Dr. Lavella LemonsJBr  he undertook an echocardiogram to assess her mitral valve  regurgitation was felt to be mild.  Ejection fraction was somewhat improved   compare with 2009 at 55%. There is been gradual incremental improvement ever since her AV ablation in the 1990s  Functional status remained stable. She is able to climb a flight of stairs and walk on level ground. ..  She has some peripheral edema. She has not had any palpitations.    Past Medical History:  Diagnosis Date  . Anemia   . Atrial fibrillation (HCC)   . Chronic diastolic heart failure (HCC)    EF 40-45%  . Depression    history of depression in the past.  . GERD (gastroesophageal reflux disease)   . Mitral valvular regurgitation   . Pacemaker- medtronic    Initial PM 1995; generator 2009, infection with contralateral implant 2010  . S/P AV nodal ablation   . Shortness of breath dyspnea     Past Surgical History:  Procedure Laterality Date  . ABDOMINAL ADHESION SURGERY  04/2006  . CARDIAC CATHETERIZATION  09/11/2007  . CARDIAC PACEMAKER PLACEMENT  1995   [facility],Medtronic Adapta ADDRL1  . CATARACT EXTRACTION, BILATERAL    . GASTRIC BYPASS  09/2003  . TEE WITHOUT CARDIOVERSION N/A 03/19/2014   Procedure: TRANSESOPHAGEAL ECHOCARDIOGRAM (TEE);  Surgeon: Antoine PocheJonathan F Branch, MD;  [facility];  Service: Endoscopy;  Laterality: N/A;    Current Outpatient Prescriptions  Medication Sig Dispense Refill  . aspirin 81 MG tablet Take  1 tablet (81 mg total) by mouth daily. 30 tablet 3  . carvedilol (COREG) 6.25 MG tablet Take 6.25 mg by mouth 2 (two) times daily with a meal.    . clonazePAM (KLONOPIN) 0.5 MG tablet Take 0.5 mg by mouth 3 (three) times daily as needed for anxiety.     . cyanocobalamin (,VITAMIN B-12,) 1000 MCG/ML injection Inject 1,000 mcg into the muscle every 30 (thirty) days.     . enalapril (VASOTEC) 10 MG tablet Take 1 tablet (10 mg total) by mouth daily. 90 tablet 3  . furosemide (LASIX) 40 MG tablet Take 1 tablet (40 mg total) by mouth 2 (two) times daily. 180 tablet 0  . omeprazole (PRILOSEC) 20 MG capsule Take 20 mg by mouth daily.    . sertraline (ZOLOFT) 100 MG tablet Take 100 mg by mouth at bedtime.     . traZODone (DESYREL) 100 MG tablet Take 100 mg by mouth at bedtime.     No current facility-administered medications for this visit.     No Known Allergies  Review of Systems negative except from HPI and PMH  Physical Exam BP 102/80   Pulse 63   Ht 5\' 5"  (1.651 m)   Wt 192 lb 3.2 oz (87.2 kg)   SpO2 99%   BMI 31.98 kg/m  Well developed and well nourished in no acute distress HENT normal E scleral and icterus clear Neck Supple JVP flat; carotids brisk and  full Clear to ausculation  Regular rate and rhythm, 2/6 murmurs gallops or rub Soft with active bowel sounds No clubbing cyanosis Trace Edema Alert and oriented, grossly normal motor and sensory function Skin Warm and Dry  ECG demonstrates atrial rhythm at a rate of about 100 beats per minute with complete heart block asynchronous ventricular pacing  Assessment and  Plan  Complete heart block status post AV junction ablation    Cardiomyopathy-nonischemic ejection fraction 50-55    HFpEF/ chronic  .   Pacemaker  medtronic    Atrial tachy    Syncope  Mitral regurgitation-mild  We discussed the possibility of reprogram her device to DDD. She is not interested. We will leave her in via [place] mode.      no recurrent  syncope.  Relatively euvolemic.  Normal pacemaker function.  Continue current medications.

## 2016-03-30 NOTE — Patient Instructions (Signed)

## 2016-04-18 LAB — CUP PACEART INCLINIC DEVICE CHECK
Battery Impedance: 764 Ohm
Battery Remaining Longevity: 67 mo
Battery Voltage: 2.78 V
Brady Statistic AP VP Percent: 10 %
Brady Statistic AP VS Percent: 0 %
Brady Statistic AS VP Percent: 90 %
Brady Statistic AS VS Percent: 0 %
Date Time Interrogation Session: 20171221180948
Implantable Lead Implant Date: 20100201
Implantable Lead Implant Date: 20100201
Implantable Lead Location: 753859
Implantable Lead Location: 753860
Implantable Lead Model: 5076
Implantable Lead Model: 5076
Implantable Pulse Generator Implant Date: 20100201
Lead Channel Impedance Value: 424 Ohm
Lead Channel Impedance Value: 544 Ohm
Lead Channel Pacing Threshold Amplitude: 1 V
Lead Channel Pacing Threshold Pulse Width: 0.4 ms
Lead Channel Sensing Intrinsic Amplitude: 0.7 mV
Lead Channel Setting Pacing Amplitude: 2 V
Lead Channel Setting Pacing Amplitude: 2.5 V
Lead Channel Setting Pacing Pulse Width: 0.4 ms
Lead Channel Setting Sensing Sensitivity: 2 mV

## 2016-06-10 ENCOUNTER — Other Ambulatory Visit: Payer: Self-pay | Admitting: Internal Medicine

## 2016-06-29 ENCOUNTER — Ambulatory Visit (INDEPENDENT_AMBULATORY_CARE_PROVIDER_SITE_OTHER): Payer: PRIVATE HEALTH INSURANCE | Admitting: *Deleted

## 2016-06-29 ENCOUNTER — Telehealth: Payer: Self-pay | Admitting: Cardiology

## 2016-06-29 DIAGNOSIS — I442 Atrioventricular block, complete: Secondary | ICD-10-CM | POA: Diagnosis not present

## 2016-06-29 LAB — CUP PACEART REMOTE DEVICE CHECK
Battery Impedance: 890 Ohm
Battery Remaining Longevity: 62 mo
Battery Voltage: 2.78 V
Brady Statistic AP VP Percent: 10 %
Brady Statistic AP VS Percent: 0 %
Brady Statistic AS VP Percent: 89 %
Brady Statistic AS VS Percent: 0 %
Date Time Interrogation Session: 20180323150436
Implantable Lead Implant Date: 20100201
Implantable Lead Implant Date: 20100201
Implantable Lead Location: 753859
Implantable Lead Location: 753860
Implantable Lead Model: 5076
Implantable Lead Model: 5076
Implantable Pulse Generator Implant Date: 20100201
Lead Channel Impedance Value: 437 Ohm
Lead Channel Impedance Value: 535 Ohm
Lead Channel Pacing Threshold Amplitude: 0.875 V
Lead Channel Pacing Threshold Pulse Width: 0.4 ms
Lead Channel Sensing Intrinsic Amplitude: 0.7 mV
Lead Channel Setting Pacing Amplitude: 2 V
Lead Channel Setting Pacing Amplitude: 2.5 V
Lead Channel Setting Pacing Pulse Width: 0.4 ms
Lead Channel Setting Sensing Sensitivity: 2 mV

## 2016-06-29 NOTE — Telephone Encounter (Signed)
LMOVM reminding pt to send remote transmission.   

## 2016-06-30 ENCOUNTER — Encounter: Payer: Self-pay | Admitting: Cardiology

## 2016-06-30 NOTE — Progress Notes (Signed)
Remote pacemaker transmission.   

## 2016-07-11 ENCOUNTER — Telehealth: Payer: Self-pay | Admitting: Internal Medicine

## 2016-07-11 NOTE — Telephone Encounter (Signed)
New message     Dr. Neita Carp wants to speak with Dr. Graciela Husbands regarding patients - Patient was seen today.

## 2016-07-13 ENCOUNTER — Other Ambulatory Visit: Payer: Self-pay

## 2016-07-13 ENCOUNTER — Encounter: Payer: Self-pay | Admitting: *Deleted

## 2016-07-13 ENCOUNTER — Encounter: Payer: Self-pay | Admitting: Internal Medicine

## 2016-07-13 DIAGNOSIS — Z01818 Encounter for other preprocedural examination: Secondary | ICD-10-CM

## 2016-07-13 NOTE — Telephone Encounter (Signed)
[name] [name],  The patient's cath is scheduled already and her letter of instructions have been completed on the letter tab. Already sent to precert- she will just need her pre-procedure labs the on Monday when Samara Deist sees her.   Thanks!

## 2016-07-13 NOTE — Telephone Encounter (Signed)
Dr. Graciela Husbands called and spoke with this patient today. Patient with complaints of chest pain- per Dr. Graciela Husbands- recommendation is for the patient to have a cath next week, but to be seen by one of the [facility] doctors on Monday for a workup as she works at [employer].   Dr. Graciela Husbands sent a message to Dr. McDowell/ Dr. Wyline Mood to review.

## 2016-07-16 NOTE — Progress Notes (Signed)
Cardiology Office Note                              History and Physical    Date:  07/17/2016   ID:  [name] [name], DOB [birth year], MRN [id number]  PCP:  Estanislado Pandy, MD  Cardiologist:  Klein/ Joni Reining, NP   Chief Complaint  Patient presents with  . Chest Pain  . Shortness of Breath   History of Present Illness: [name] [name] is a 56 y.o. female who presents for complaints of chest pain. She is normally followed by Dr. Graciela Husbands in the setting of tachycardia induced cardiomyopathy, s/p AV ablation and DDD pacemaker, 1995, with generator replacement in April of 2008. Other history includes atrial fib, chronic diastolic CHF, mitral valve regurgitation, and chronic dyspnea.   The patient was seen by Dr. Neita Carp with complaints of                      . As a result, Dr. Graciela Husbands has recommended cardiac cath, which is planned for 07/18/2016.     Past Medical History:  Diagnosis Date  . Anemia   . Atrial fibrillation (HCC)   . Chronic diastolic heart failure (HCC)    EF 40-45%  . Depression    history of depression in the past.  . GERD (gastroesophageal reflux disease)   . Mitral valvular regurgitation   . Pacemaker- medtronic    Initial PM 1995; generator 2009, infection with contralateral implant 2010  . S/P AV nodal ablation   . Shortness of breath dyspnea     Past Surgical History:  Procedure Laterality Date  . ABDOMINAL ADHESION SURGERY  04/2006  . CARDIAC CATHETERIZATION  09/11/2007  . CARDIAC PACEMAKER PLACEMENT  1995   [facility],Medtronic Adapta ADDRL1  . CATARACT EXTRACTION, BILATERAL    . GASTRIC BYPASS  09/2003  . TEE WITHOUT CARDIOVERSION N/A 03/19/2014   Procedure: TRANSESOPHAGEAL ECHOCARDIOGRAM (TEE);  Surgeon: Antoine Poche, MD;  [facility];  Service: Endoscopy;  Laterality: N/A;     Current Outpatient Prescriptions  Medication Sig Dispense Refill  . aspirin 81 MG tablet Take 1 tablet (81 mg total) by mouth daily. 30  tablet 3  . atorvastatin (LIPITOR) 10 MG tablet Take 10 mg by mouth every evening.    . carvedilol (COREG) 6.25 MG tablet Take 6.25 mg by mouth 2 (two) times daily with a meal.    . clonazePAM (KLONOPIN) 0.5 MG tablet Take 0.5 mg by mouth 3 (three) times daily as needed for anxiety.     . cyanocobalamin (,VITAMIN B-12,) 1000 MCG/ML injection Inject 1,000 mcg into the muscle every 30 (thirty) days.     . enalapril (VASOTEC) 10 MG tablet Take 1 tablet (10 mg total) by mouth daily. 90 tablet 3  . furosemide (LASIX) 40 MG tablet TAKE ONE TABLET BY MOUTH TWICE DAILY 180 tablet 2  . Melatonin 10 MG TABS Take 1 tablet by mouth daily.    Marland Kitchen omeprazole (PRILOSEC) 20 MG capsule Take 20 mg by mouth daily.    . sertraline (ZOLOFT) 100 MG tablet Take 100 mg by mouth at bedtime.     . traZODone (DESYREL) 100 MG tablet Take 100 mg by mouth at bedtime.     No current facility-administered medications for this visit.     Allergies:   Patient has no known allergies.    Social History:  The  patient  reports that she has never smoked. She has never used smokeless tobacco. She reports that she drinks about 4.2 oz of alcohol per week . She reports that she does not use drugs.   Family History:  The patient's family history includes Arthritis in her sister; Arthritis-Osteo in her sister, sister, and sister; Atrial fibrillation in her sister; CAD in her brother, brother, and sister; Cancer in her sister; Cancer - Prostate in her father; Depression in her sister; Diabetes in her mother and sister; Fibromyalgia in her sister; Heart attack in her brother and brother; Heart failure in her mother; Hypertension in her brother, brother, mother, sister, sister, and sister; Mitral valve prolapse in her sister; Osteoporosis in her sister; Pneumonia in her sister; Stroke in her mother.    ROS: All other systems are reviewed and negative. Unless otherwise mentioned in H&P    PHYSICAL EXAM: VS:  BP 108/76   Pulse 65   Ht 5'  5" (1.651 m)   Wt 192 lb (87.1 kg)   SpO2 96%   BMI 31.95 kg/m  , BMI Body mass index is 31.95 kg/m. GEN: Well nourished, well developed, in no acute distress  HEENT: normal  Neck: no JVD, carotid bruits, or masses Cardiac: RRR; split S2, no, rubs, or gallops,no edema  Respiratory:  clear to auscultation bilaterally, normal work of breathing GI: soft, nontender, nondistended, + BS MS: no deformity or atrophy  Skin: warm and dry, no rash Neuro:  Strength and sensation are intact Psych: euthymic mood, full affect   EKG:  Paced rhythm  Recent Labs: No results found for requested labs within last 8760 hours. See scanned documennts.    Lipid Panel No results found for: CHOL, TRIG, HDL, CHOLHDL, VLDL, LDLCALC, LDLDIRECT    Wt Readings from Last 3 Encounters:  07/17/16 192 lb (87.1 kg)  03/30/16 192 lb 3.2 oz (87.2 kg)  12/15/14 194 lb 3.2 oz (88.1 kg)      Other studies Reviewed: Echocardiogram           *[facility]*            [address]            [place], [place] [address]              [phone]  ------------------------------------------------------------------- Transesophageal Echocardiography  Patient:  [name], [name] MR #:    [id number] Study Date: 03/19/2014 Gender:   F Age:    42 Height:   165.1 cm Weight:   99.8 kg BSA:    2.18 m^2 Pt. Status: Room:    [facility]  SONOGRAPHER Metro Kung, RDCS ADMITTING  Patrick Jupiter, M.D. ATTENDING  Patrick Jupiter, M.D. Lisette Abu, M.D. REFERRING  Patrick Jupiter, M.D. PERFORMING  [facility] [place]  cc:  ------------------------------------------------------------------- LV EF: 50% -  55%  ------------------------------------------------------------------- Indications:   Mitral regurgitation 424.0.  ------------------------------------------------------------------- History:  PMH:   Congestive heart failure. Risk factors: cardiomyopathy.  ------------------------------------------------------------------- Study Conclusions  - Left ventricle: Systolic function was normal. The estimated ejection fraction was in the range of 50% to 55%. Wall motion was normal; there were no regional wall motion abnormalities. - Mitral valve: There appears to be two distinct regurgitant jets. Both are mild by vena contracta measurement (0.2 and 0.3 cm). Composite of regurgitation is in the mild to moderate range. PISA is not performed, inaccurate in the setting of multiple regurgitant jets. - Left atrium: The atrium was mildly to  moderately dilated. No evidence of thrombus in the atrial cavity or appendage. - Tricuspid valve: There was moderate regurgitation. - Systemic veins: There was systolic blunting of pulmonary vein flow consistent with elevated LA pressure. - Technically adequate study.  ------------------------------------------------------------------- Labs, prior tests, procedures, and surgery: AV nodal ablation. Permanent pacemaker system implantation. Diagnostic transesophageal echocardiography. 2D and color Doppler. Birthdate: Patient birthdate: Jul 02, 1960. Age: Patient is 56 yr old. Sex: Gender: female.  BMI: 36.6 kg/m^2. Blood pressure: 121/67 Patient status: Outpatient. Study date: Study date: 03/19/2014. Study time: 11:35 AM. Location: Endoscopy.  -------------------------------------------------------------------  ------------------------------------------------------------------- Left ventricle: Systolic function was normal. The estimated ejection fraction was in the range of 50% to 55%. Wall motion was normal; there were no regional wall motion abnormalities.  ------------------------------------------------------------------- Aortic valve:  Structurally normal valve.  Cusp separation was normal. Doppler:  There  was no stenosis.  There was no regurgitation.  ------------------------------------------------------------------- Aorta: Aortic root: The aortic root was normal in size. The visualized portions of the aortic arch and descending thoracic aorta are normal in size without significant atherosclerosis.  ------------------------------------------------------------------- Mitral valve: There appears to be two distinct regurgitant jets. Both are mild by vena contracta measurement (0.2 and 0.3 cm). Composite of regurgitation is in the mild to moderate range. PISA is not performed, inaccurate in the setting of multiple regurgitant jets. Normal thickness leaflets . Doppler:   Peak gradient (D): 3 mm Hg.  ------------------------------------------------------------------- Left atrium: The atrium was mildly to moderately dilated. No evidence of thrombus in the atrial cavity or appendage. The appendage was of normal size. Emptying velocity was normal at 53 cm/s.  ------------------------------------------------------------------- Atrial septum: The interatrial septum is intact with no evidence of shunting by color Doppler.  ------------------------------------------------------------------- Right ventricle: The cavity size was normal. Wall thickness was normal. Pacer wire or catheter noted in right ventricle. Systolic function was normal.  ------------------------------------------------------------------- Pulmonic valve:  Not well visualized. Doppler:  There was no evidence for stenosis.  There was no significant regurgitation.  ------------------------------------------------------------------- Tricuspid valve:  Normal thickness leaflets. Doppler:  There was no evidence for stenosis.  There was moderate regurgitation.  ------------------------------------------------------------------- Right atrium: Poorly visualized. Pacer wire or catheter noted in right  atrium.  ------------------------------------------------------------------- Pericardium: There was no pericardial effusion.  ------------------------------------------------------------------- Systemic veins: There was systolic blunting of pulmonary vein flow consistent with elevated LA pressure.  ------------------------------------------------------------------- Measurements  Aorta                   Value    Reference Aortic root ID              33  mm  ---------  Mitral valve               Value    Reference Mitral E-wave peak velocity        91.6 cm/s --------- Mitral A-wave peak velocity        28.8 cm/s --------- Mitral deceleration time       (H) 254  ms  150 - 230 Mitral peak gradient, D          3   mm Hg --------- Mitral E/A ratio, peak          3.2     --------- Mitral maximal regurg velocity, PISA   468  cm/s --------- Mitral regurg VTI, PISA          160  cm  ---------  Legend: (L) and (H) mark values outside specified reference range.  ------------------------------------------------------------------- Prepared and Electronically Authenticated  by  Patrick Jupiter, M.D. 2015-12-10T16:00:40 ASSESSMENT AND PLAN:  1.  Chest Pain: Worrisome for angina. Patient is scheduled for cardiac catheterization in a.m. for diagnostic prognostic purposes.The patient understands that risks include but are not limited to stroke (1 in 1000), death (1 in 1000), kidney failure [usually temporary] (1 in 500), bleeding (1 in 200), allergic reaction [possibly serious] (1 in 200), and agrees to proceed. She will hold Lasix in the a.m.  2. Hypertension: Currently well-controlled on current medication regimen.  3. Tachycardia-induced cardiomyopathy: Continue beta blocker therapy, ACE inhibitor, and Lasix.  4. Hyper cholesterolemia: Continue statin  therapy.   Current medicines are reviewed at length with the patient today.    Labs/ tests ordered today include:  No orders of the defined types were placed in this encounter.    Disposition:   FU with post catheterization with Dr. Graciela Husbands.  Signed, Joni Reining, NP  07/17/2016 5:14 PM    [facility] [address], [place], [place] [address] Phone: [phone]; Fax: [phone]

## 2016-07-17 ENCOUNTER — Encounter: Payer: Self-pay | Admitting: *Deleted

## 2016-07-17 ENCOUNTER — Encounter: Payer: Self-pay | Admitting: Adult Health

## 2016-07-17 ENCOUNTER — Ambulatory Visit (INDEPENDENT_AMBULATORY_CARE_PROVIDER_SITE_OTHER): Payer: Commercial Managed Care - PPO | Admitting: Adult Health

## 2016-07-17 VITALS — BP 108/76 | HR 65 | Ht 65.0 in | Wt 192.0 lb

## 2016-07-17 DIAGNOSIS — I10 Essential (primary) hypertension: Secondary | ICD-10-CM | POA: Diagnosis not present

## 2016-07-17 DIAGNOSIS — R079 Chest pain, unspecified: Secondary | ICD-10-CM | POA: Diagnosis not present

## 2016-07-17 DIAGNOSIS — I428 Other cardiomyopathies: Secondary | ICD-10-CM | POA: Diagnosis not present

## 2016-07-17 NOTE — Patient Instructions (Addendum)
   [facility] CARDIOVASCULAR DIVISION [facility] [address] [place] [address] Dept: [phone] Loc: [phone]  [name] [name]  07/17/2016  You are scheduled for a Cardiac Catheterization on Tuesday, April 10 with Dr. Nicki Guadalajara.  1. Please arrive at the [facility] (Main Entrance A) at [facility]: [address] [place], [place] [address] at 8:30 AM (two hours before your procedure to ensure your preparation). Free valet parking service is available.   Special note: Every effort is made to have your procedure done on time. Please understand that emergencies sometimes delay scheduled procedures.  2. Diet: Do not eat or drink anything after midnight prior to your procedure except sips of water to take medications.  3. Labs: None needed.  4. Medication instructions in preparation for your procedure:    Current Outpatient Prescriptions (Cardiovascular):  .  atorvastatin (LIPITOR) 10 MG tablet, Take 10 mg by mouth every evening. .  carvedilol (COREG) 6.25 MG tablet, Take 6.25 mg by mouth 2 (two) times daily with a meal. .  enalapril (VASOTEC) 10 MG tablet, Take 1 tablet (10 mg total) by mouth daily. .  furosemide (LASIX) 40 MG tablet, TAKE ONE TABLET BY MOUTH TWICE DAILY   Current Outpatient Prescriptions (Analgesics):  .  aspirin 81 MG tablet, Take 1 tablet (81 mg total) by mouth daily.  Current Outpatient Prescriptions (Hematological):  .  cyanocobalamin (,VITAMIN B-12,) 1000 MCG/ML injection, Inject 1,000 mcg into the muscle every 30 (thirty) days.   Current Outpatient Prescriptions (Other):  .  clonazePAM (KLONOPIN) 0.5 MG tablet, Take 0.5 mg by mouth 3 (three) times daily as needed for anxiety.  .  Melatonin 10 MG TABS, Take 1 tablet by mouth daily. Marland Kitchen  omeprazole (PRILOSEC) 20 MG capsule, Take 20 mg by mouth daily. .  sertraline (ZOLOFT) 100 MG tablet, Take 100 mg by mouth at bedtime.  .  traZODone (DESYREL) 100  MG tablet, Take 100 mg by mouth at bedtime. *For reference purposes while preparing patient instructions.   Delete this med list prior to printing instructions for patient.*    On the morning of your procedure, take your Aspirin and any morning medicines NOT listed above.  You may use sips of water.  5. Plan for one night stay--bring personal belongings. 6. Bring a current list of your medications and current insurance cards. 7. You MUST have a responsible person to drive you home. 8. Someone MUST be with you the first 24 hours after you arrive home or your discharge will be delayed. 9. Please wear clothes that are easy to get on and off and wear slip-on shoes.  Thank you for allowing [place] to care for you!   -- [facility] Your physician recommends that you schedule a follow-up appointment in: Dr. Graciela Husbands

## 2016-07-17 NOTE — Progress Notes (Signed)
CARDIOLOGY HISTORY AND PHYSICAL   Patient ID: [name] [name] MRN: [id number]  DOB/AGE: 56-Nov-1962 56 y.o. Admit date: (Not on file)  Primary Care Physician: Estanislado Pandy, MD Primary Cardiologist: Dr. Graciela Husbands  Clinical Summary [name] is a 56 y.o.female with known history of mitral valve prolapse, hypertension, hyperlipidemia, history of non-ST elevation MI, tachycardic-induced cardiomyopathy, status post AV ablation and DDD pacemaker, generator replacement in April 2008, atrial fibrillation, chronic diastolic heart failure, and chronic dyspnea.  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 The patient has been noticing increasing chest discomfort, aching feeling with radiation down left arm with worsening dyspnea. She expressed this to her primary care physician Dr. Neita Carp, who contacted cardiologist Dr. Graciela Husbands about symptoms. Based upon her symptoms, Dr. Graciela Husbands felt it was best that the patient be scheduled for a left heart catheterization for diagnostic prognostic purposes.  The patient was seen in the office in result today for evaluation of symptoms. She states that the symptoms have been progressive, usually associated with walking fast, stress, and sometimes with house cleaning. The patient has been ignoring the symptoms, but have become more progressive over the last month.  Prior to this office appointment, the patient had lab work completed which included chest x-ray, BMET, CBC, and PT INR. These labs have been scanned in to the permanent record.   No Known Allergies  Home Medications Aspirin 81 mg daily Atorvastatin 10 mg at at bedtime Carvedilol 6.25 mg twice a day Klonopin 0.5 mg 3 times a day when necessary anxiety Vitamin B12 injection every 30 days Enalapril 10 mg by mouth daily Lasix 40 mg twice a day (patient only taking it once a day) Omeprazole  proximal 20 mg daily Sertraline 100 mg at bedtime Trazodone 100 mg at at bedtime  (Not in a  hospital admission)  Scheduled Medications    Infusions    PRN Medications    Past Medical History:  Diagnosis Date  . Anemia   . Atrial fibrillation (HCC)   . Chronic diastolic heart failure (HCC)    EF 40-45%  . Depression    history of depression in the past.  . GERD (gastroesophageal reflux disease)   . Mitral valvular regurgitation   . Pacemaker- medtronic    Initial PM 1995; generator 2009, infection with contralateral implant 2010  . S/P AV nodal ablation   . Shortness of breath dyspnea     Past Surgical History:  Procedure Laterality Date  . ABDOMINAL ADHESION SURGERY  04/2006  . CARDIAC CATHETERIZATION  09/11/2007  . CARDIAC PACEMAKER PLACEMENT  1995   [facility],Medtronic Adapta ADDRL1  . CATARACT EXTRACTION, BILATERAL    . GASTRIC BYPASS  09/2003  . TEE WITHOUT CARDIOVERSION N/A 03/19/2014   Procedure: TRANSESOPHAGEAL ECHOCARDIOGRAM (TEE);  Surgeon: Antoine Poche, MD;  Location: [facility];  Service: Endoscopy;  Laterality: N/A;    Family History  Problem Relation Age of Onset  . Diabetes Mother   . Hypertension Mother   . Stroke Mother   . Heart failure Mother   . Cancer - Prostate Father   . Arthritis-Osteo Sister   . Hypertension Sister   . Diabetes Sister   . CAD Sister   . Arthritis Sister   . Hypertension Sister   . Arthritis-Osteo Sister   . Atrial fibrillation Sister   . Cancer Sister   . Pneumonia Sister   . Osteoporosis Sister   . Hypertension Sister   . Mitral  valve prolapse Sister   . Arthritis-Osteo Sister   . Fibromyalgia Sister   . Depression Sister   . Hypertension Brother   . CAD Brother   . Heart attack Brother   . Hypertension Brother   . CAD Brother   . Heart attack Brother     Social History [name] reports that she has never smoked. She has never used smokeless tobacco. Ms. Foust reports that she drinks about 4.2 oz of alcohol per week .  Review of Systems Otherwise reviewed and  negative except as outlined.  Physical Examination  Gen: No acute distress. HEENT: Conjunctiva and lids normal, oropharynx clear with moist mucosa. Neck: Supple, no elevated JVP or carotid bruits, no thyromegaly. Lungs: Clear to auscultation, nonlabored breathing at rest. Cardiac: Regular rate and rhythm, no S3 or significant systolic murmur, no pericardial rub. Abdomen: Soft, nontender, no hepatomegaly, bowel sounds present, no guarding or rebound. Extremities: No pitting edema, distal pulses 2+. Skin: Warm and dry. Musculoskeletal: No kyphosis. Neuropsychiatric: Alert and oriented x3, affect grossly appropriate.   Lab Results  Basic Metabolic Panel: (Please see scanned documents for labs completed within the last 3 days)  Liver Function Tests:  CBC:  Radiology Please see scanned document   Prior Cardiac Testing/Procedures:  AV nodal ablation Insertion of DDD pacemaker, with generator change in 2008  Echocardiogram Left ventricle: Systolic function was normal. The estimated ejection fraction was in the range of 50% to 55%. Wall motion was normal; there were no regional wall motion abnormalities. - Mitral valve: There appears to be two distinct regurgitant jets. Both are mild by vena contracta measurement (0.2 and 0.3 cm). Composite of regurgitation is in the mild to moderate range. PISA is not performed, inaccurate in the setting of multiple regurgitant jets. - Left atrium: The atrium was mildly to moderately dilated. No evidence of thrombus in the atrial cavity or appendage. - Tricuspid valve: There was moderate regurgitation. - Systemic veins: There was systolic blunting of pulmonary vein flow consistent with elevated LA pressure. - Technically adequate study.  ECG: AV pacing   Impression and Recommendations 1. Chest pain: Occurring with exertion with radiation down the left arm with associated dyspnea. Multiple cardiovascular risk factors  include hypercholesterolemia, history of cardiomyopathy, and strong family history. Patient is scheduled for cardiac catheterization on Tuesday, April 10 at 10:30 AM with Dr. Tresa Endo. This is for diagnostic prognostic purposes to evaluate coronary artery disease. The patient understands that risks include but are not limited to stroke (1 in 1000), death (1 in 1000), kidney failure [usually temporary] (1 in 500), bleeding (1 in 200), allergic reaction [possibly serious] (1 in 200), and agrees to proceed.   2. Hypertension: Excellent control of blood pressure on current medication regimen.  3. Hypercholesterolemia: Continue current medication management with statin therapy.  4. History of tachycardic-induced cardiomyopathy: Continue beta blocker, and blood pressure control.   Signed: Bettey Mare. Edin Kon NP [facility]  07/17/2016, 5:07 PM Co-Sign MD

## 2016-07-17 NOTE — Progress Notes (Signed)
Name: [name] [name]    DOB: 10-17-1960  Age: 56 y.o.  MR#: [id number]       PCP:  Manon Hilding, MD      Insurance: Payor: Onnie Boer / Plan: UMR/[employer]HC PPO / Product Type: *No Product type* /   CC:   No chief complaint on file.   VS Vitals:   07/17/16 1556  BP: 108/76  Pulse: 65  SpO2: 96%  Weight: 192 lb (87.1 kg)  Height: 5' 5" (1.651 m)    Weights Current Weight  07/17/16 192 lb (87.1 kg)  03/30/16 192 lb 3.2 oz (87.2 kg)  12/15/14 194 lb 3.2 oz (88.1 kg)    Blood Pressure  BP Readings from Last 3 Encounters:  07/17/16 108/76  03/30/16 102/80  12/15/14 112/80     Admit date:  (Not on file) Last encounter with RMR:  Visit date not found   Allergy Patient has no known allergies.  Current Outpatient Prescriptions  Medication Sig Dispense Refill  . aspirin 81 MG tablet Take 1 tablet (81 mg total) by mouth daily. 30 tablet 3  . atorvastatin (LIPITOR) 10 MG tablet Take 10 mg by mouth every evening.    . carvedilol (COREG) 6.25 MG tablet Take 6.25 mg by mouth 2 (two) times daily with a meal.    . clonazePAM (KLONOPIN) 0.5 MG tablet Take 0.5 mg by mouth 3 (three) times daily as needed for anxiety.     . cyanocobalamin (,VITAMIN B-12,) 1000 MCG/ML injection Inject 1,000 mcg into the muscle every 30 (thirty) days.     . enalapril (VASOTEC) 10 MG tablet Take 1 tablet (10 mg total) by mouth daily. 90 tablet 3  . furosemide (LASIX) 40 MG tablet TAKE ONE TABLET BY MOUTH TWICE DAILY 180 tablet 2  . Melatonin 10 MG TABS Take 1 tablet by mouth daily.    Marland Kitchen omeprazole (PRILOSEC) 20 MG capsule Take 20 mg by mouth daily.    . sertraline (ZOLOFT) 100 MG tablet Take 100 mg by mouth at bedtime.     . traZODone (DESYREL) 100 MG tablet Take 100 mg by mouth at bedtime.     No current facility-administered medications for this visit.     Discontinued Meds:   There are no discontinued medications.  Patient Active Problem List   Diagnosis Date Noted  . Chronic diastolic heart  failure ([place])   . AV BLOCK, COMPLETE 03/02/2009  . ATRIAL FIBRILLATION 03/02/2009  . PACEMAKER,MDT 03/02/2009    LABS    Component Value Date/Time   NA 138 05/09/2008 0544   NA 133 (L) 05/08/2008 0400   NA 139 05/05/2008 1641   K 3.7 05/09/2008 0544   K 3.8 05/08/2008 0400   K 4.4 05/05/2008 1641   CL 109 05/09/2008 0544   CL 105 05/08/2008 0400   CL 107 05/05/2008 1641   CO2 23 05/09/2008 0544   CO2 22 05/08/2008 0400   CO2 26 05/05/2008 1641   GLUCOSE 98 05/09/2008 0544   GLUCOSE 91 05/08/2008 0400   GLUCOSE 99 05/05/2008 1641   BUN 6 05/09/2008 0544   BUN 7 05/08/2008 0400   BUN 10 05/05/2008 1641   CREATININE 0.76 05/09/2008 0544   CREATININE 0.72 05/08/2008 0400   CREATININE 0.8 05/05/2008 1641   CALCIUM 8.1 (L) 05/09/2008 0544   CALCIUM 8.1 (L) 05/08/2008 0400   CALCIUM 9.0 05/05/2008 1641   GFRNONAA >60 05/09/2008 0544   GFRNONAA >60 05/08/2008 0400   GFRNONAA 82 05/05/2008 1641  GFRAA  05/09/2008 0544    >60        The eGFR has been calculated using the MDRD equation. This calculation has not been validated in all clinical situations. eGFR's persistently <60 mL/min signify possible Chronic Kidney Disease.   GFRAA  05/08/2008 0400    >60        The eGFR has been calculated using the MDRD equation. This calculation has not been validated in all clinical situations. eGFR's persistently <60 mL/min signify possible Chronic Kidney Disease.   GFRAA 99 05/05/2008 1641   CMP     Component Value Date/Time   NA 138 05/09/2008 0544   K 3.7 05/09/2008 0544   CL 109 05/09/2008 0544   CO2 23 05/09/2008 0544   GLUCOSE 98 05/09/2008 0544   BUN 6 05/09/2008 0544   CREATININE 0.76 05/09/2008 0544   CALCIUM 8.1 (L) 05/09/2008 0544   GFRNONAA >60 05/09/2008 0544   GFRAA  05/09/2008 0544    >60        The eGFR has been calculated using the MDRD equation. This calculation has not been validated in all clinical situations. eGFR's persistently <60 mL/min  signify possible Chronic Kidney Disease.       Component Value Date/Time   WBC 6.9 05/20/2008 1211   WBC 3.1 (L) 05/09/2008 0544   WBC 3.2 (L) 05/08/2008 0400   HGB 9.5 (L) 05/20/2008 1211   HGB 8.2 (L) 05/09/2008 0544   HGB 8.4 (L) 05/08/2008 0400   HCT 29.5 (L) 05/20/2008 1211   HCT 25.7 (L) 05/09/2008 0544   HCT 25.8 (L) 05/08/2008 0400   MCV 80.1 05/20/2008 1211   MCV 80.7 05/09/2008 0544   MCV 79.4 05/08/2008 0400    Lipid Panel  No results found for: CHOL, TRIG, HDL, CHOLHDL, VLDL, LDLCALC, LDLDIRECT  ABG No results found for: PHART, PCO2ART, PO2ART, HCO3, TCO2, ACIDBASEDEF, O2SAT   No results found for: TSH BNP (last 3 results) No results for input(s): BNP in the last 8760 hours.  ProBNP (last 3 results) No results for input(s): PROBNP in the last 8760 hours.  Cardiac Panel (last 3 results) No results for input(s): CKTOTAL, CKMB, TROPONINI, RELINDX in the last 72 hours.  Iron/TIBC/Ferritin/ %Sat No results found for: IRON, TIBC, FERRITIN, IRONPCTSAT   EKG Orders placed or performed in visit on 03/30/16  . EKG 12-Lead     Prior Assessment and Plan Problem List as of 07/17/2016 Reviewed: 04/21/2014 10:45 AM by Carlyle Dolly, MD     Cardiovascular and Mediastinum   AV BLOCK, COMPLETE   Last Assessment & Plan 05/28/2012 Office Visit Written 05/28/2012  5:55 PM by Deboraha Sprang, MD    As above       ATRIAL FIBRILLATION   Last Assessment & Plan 02/23/2011 Office Visit Written 02/23/2011  3:37 PM by Thompson Grayer, MD    Permanent atrial arrhythmias On ASA per Dr Caryl Comes,      Chronic diastolic heart failure [facility])   Last Assessment & Plan 05/28/2012 Office Visit Written 05/28/2012  5:55 PM by Deboraha Sprang, MD    Patient has dyspnea on exertion. She has noted some improvement with diuresis. She has elevated neck veins. Interestingly also she had a catheterization LVEDP described as 40. His pulmonary hypertension with a measured PA pressure 2 years later of 43.  (at 40 may have been  14). We will check an echo and look at E./E' as well as PA pressures. We'll increase her diuretic to 4 times  a week. He discussed the potential role of chronotropic incompetence and be evaluated increasing her heart rate. In the event that the aforementioned interventions and studies are unilluminating we will submit her for treadmill testing with heart rate reprogramming to see if improved chronotropic competence can be helpful. Interrogation of her device today demonstrated only 2% of her heart beats, i.e. 15 minutes a day, or faster than 100 beats per minute. There is obviously a trade-off between heart rate in diastolic filling and given the elevated LVEDP and pulmonary hypertension I would like at this point to err on the side of lower heart rates; I have reviewed this with the patient and she is agreeable        Other   PACEMAKER,MDT   Last Assessment & Plan 05/29/2013 Office Visit Written 05/29/2013  8:57 AM by Deboraha Sprang, MD    The patient's device was interrogated and the information was fully reviewed.  The device was reprogrammed to  As above           Imaging: No results found.

## 2016-07-18 ENCOUNTER — Encounter (HOSPITAL_COMMUNITY): Payer: Self-pay | Admitting: Cardiovascular Disease

## 2016-07-18 ENCOUNTER — Ambulatory Visit (HOSPITAL_COMMUNITY)
Admission: RE | Admit: 2016-07-18 | Discharge: 2016-07-18 | Disposition: A | Discharge status: Home / Self Care | Payer: Commercial Managed Care - PPO | Source: Ambulatory Visit | Attending: Cardiovascular Disease | Admitting: Cardiovascular Disease

## 2016-07-18 ENCOUNTER — Encounter (HOSPITAL_COMMUNITY)
Admission: RE | Disposition: A | Discharge status: Home / Self Care | Payer: Self-pay | Source: Ambulatory Visit | Attending: Cardiovascular Disease

## 2016-07-18 DIAGNOSIS — I340 Nonrheumatic mitral (valve) insufficiency: Secondary | ICD-10-CM | POA: Diagnosis not present

## 2016-07-18 DIAGNOSIS — Z7982 Long term (current) use of aspirin: Secondary | ICD-10-CM | POA: Insufficient documentation

## 2016-07-18 DIAGNOSIS — I97790 Other intraoperative cardiac functional disturbances during cardiac surgery: Secondary | ICD-10-CM | POA: Insufficient documentation

## 2016-07-18 DIAGNOSIS — K219 Gastro-esophageal reflux disease without esophagitis: Secondary | ICD-10-CM | POA: Diagnosis not present

## 2016-07-18 DIAGNOSIS — I471 Supraventricular tachycardia: Secondary | ICD-10-CM | POA: Insufficient documentation

## 2016-07-18 DIAGNOSIS — Z8249 Family history of ischemic heart disease and other diseases of the circulatory system: Secondary | ICD-10-CM | POA: Diagnosis not present

## 2016-07-18 DIAGNOSIS — R079 Chest pain, unspecified: Secondary | ICD-10-CM

## 2016-07-18 DIAGNOSIS — Z9884 Bariatric surgery status: Secondary | ICD-10-CM | POA: Diagnosis not present

## 2016-07-18 DIAGNOSIS — I4891 Unspecified atrial fibrillation: Secondary | ICD-10-CM | POA: Insufficient documentation

## 2016-07-18 DIAGNOSIS — F329 Major depressive disorder, single episode, unspecified: Secondary | ICD-10-CM | POA: Insufficient documentation

## 2016-07-18 DIAGNOSIS — E7800 Pure hypercholesterolemia, unspecified: Secondary | ICD-10-CM | POA: Diagnosis not present

## 2016-07-18 DIAGNOSIS — I110 Hypertensive heart disease with heart failure: Secondary | ICD-10-CM | POA: Diagnosis not present

## 2016-07-18 DIAGNOSIS — I429 Cardiomyopathy, unspecified: Secondary | ICD-10-CM | POA: Insufficient documentation

## 2016-07-18 DIAGNOSIS — Z950 Presence of cardiac pacemaker: Secondary | ICD-10-CM | POA: Insufficient documentation

## 2016-07-18 DIAGNOSIS — Y840 Cardiac catheterization as the cause of abnormal reaction of the patient, or of later complication, without mention of misadventure at the time of the procedure: Secondary | ICD-10-CM | POA: Diagnosis not present

## 2016-07-18 DIAGNOSIS — I5032 Chronic diastolic (congestive) heart failure: Secondary | ICD-10-CM | POA: Diagnosis not present

## 2016-07-18 HISTORY — PX: LEFT HEART CATH AND CORONARY ANGIOGRAPHY: CATH118249

## 2016-07-18 SURGERY — LEFT HEART CATH AND CORONARY ANGIOGRAPHY
Anesthesia: LOCAL

## 2016-07-18 MED ORDER — IOPAMIDOL (ISOVUE-370) INJECTION 76%
INTRAVENOUS | Status: AC
  Filled 2016-07-18: qty 100

## 2016-07-18 MED ORDER — SODIUM CHLORIDE 0.9 % IV SOLN: 250.0000 mL | INTRAVENOUS | Status: DC | PRN

## 2016-07-18 MED ORDER — SODIUM CHLORIDE 0.9% FLUSH: 3.0000 mL | INTRAVENOUS | Status: DC | PRN

## 2016-07-18 MED ORDER — ACETAMINOPHEN 325 MG PO TABS: 650.0000 mg | ORAL_TABLET | ORAL | Status: DC | PRN

## 2016-07-18 MED ORDER — ONDANSETRON HCL 4 MG/2ML IJ SOLN: 4.0000 mg | Freq: Four times a day (QID) | INTRAMUSCULAR | Status: DC | PRN

## 2016-07-18 MED ORDER — HEPARIN (PORCINE) IN NACL 2-0.9 UNIT/ML-% IJ SOLN
INTRAMUSCULAR | Status: AC
  Filled 2016-07-18: qty 1000

## 2016-07-18 MED ORDER — SODIUM CHLORIDE 0.9% FLUSH: 3.0000 mL | Freq: Two times a day (BID) | INTRAVENOUS | Status: DC

## 2016-07-18 MED ORDER — LIDOCAINE HCL (PF) 1 % IJ SOLN
INTRAMUSCULAR | Status: DC | PRN
Start: 2016-07-18 — End: 2016-07-18
  Administered 2016-07-18: 4 mL

## 2016-07-18 MED ORDER — FENTANYL CITRATE (PF) 100 MCG/2ML IJ SOLN
INTRAMUSCULAR | Status: AC
  Filled 2016-07-18: qty 2

## 2016-07-18 MED ORDER — IOPAMIDOL (ISOVUE-370) INJECTION 76%
INTRAVENOUS | Status: DC | PRN
  Administered 2016-07-18: 40 mL via INTRAVENOUS

## 2016-07-18 MED ORDER — FENTANYL CITRATE (PF) 100 MCG/2ML IJ SOLN
INTRAMUSCULAR | Status: DC | PRN
  Administered 2016-07-18: 25 ug via INTRAVENOUS

## 2016-07-18 MED ORDER — HEPARIN SODIUM (PORCINE) 1000 UNIT/ML IJ SOLN
INTRAMUSCULAR | Status: DC | PRN
  Administered 2016-07-18: 4500 [IU] via INTRAVENOUS

## 2016-07-18 MED ORDER — ASPIRIN 81 MG PO CHEW: 81.0000 mg | CHEWABLE_TABLET | ORAL | Status: DC

## 2016-07-18 MED ORDER — OXYCODONE-ACETAMINOPHEN 5-325 MG PO TABS
1.0000 | ORAL_TABLET | Freq: Once | ORAL | Status: AC
  Administered 2016-07-18: 1 via ORAL
  Filled 2016-07-18: qty 1

## 2016-07-18 MED ORDER — SODIUM CHLORIDE 0.9 % WEIGHT BASED INFUSION: 1.0000 mL/kg/h | INTRAVENOUS | Status: DC

## 2016-07-18 MED ORDER — LIDOCAINE HCL (PF) 1 % IJ SOLN
INTRAMUSCULAR | Status: AC
  Filled 2016-07-18: qty 30

## 2016-07-18 MED ORDER — VERAPAMIL HCL 2.5 MG/ML IV SOLN
INTRAVENOUS | Status: DC | PRN
  Administered 2016-07-18: 10 mL via INTRA_ARTERIAL

## 2016-07-18 MED ORDER — HEPARIN SODIUM (PORCINE) 1000 UNIT/ML IJ SOLN
INTRAMUSCULAR | Status: AC
  Filled 2016-07-18: qty 1

## 2016-07-18 MED ORDER — SODIUM CHLORIDE 0.9 % WEIGHT BASED INFUSION
3.0000 mL/kg/h | INTRAVENOUS | Status: DC
  Administered 2016-07-18: 3 mL/kg/h via INTRAVENOUS

## 2016-07-18 MED ORDER — VERAPAMIL HCL 2.5 MG/ML IV SOLN
INTRAVENOUS | Status: AC
  Filled 2016-07-18: qty 2

## 2016-07-18 MED ORDER — SODIUM CHLORIDE 0.9 % IV SOLN: INTRAVENOUS | Status: DC

## 2016-07-18 MED ORDER — DIAZEPAM 5 MG PO TABS
5.0000 mg | ORAL_TABLET | Freq: Four times a day (QID) | ORAL | Status: DC | PRN
Start: 2016-07-18 — End: 2016-07-18

## 2016-07-18 MED ORDER — SODIUM CHLORIDE 0.9 % IV BOLUS (SEPSIS): 250.0000 mL | Freq: Once | INTRAVENOUS | Status: DC

## 2016-07-18 MED ORDER — MIDAZOLAM HCL 2 MG/2ML IJ SOLN
INTRAMUSCULAR | Status: DC | PRN
  Administered 2016-07-18: 2 mg via INTRAVENOUS

## 2016-07-18 MED ORDER — HEPARIN (PORCINE) IN NACL 2-0.9 UNIT/ML-% IJ SOLN
INTRAMUSCULAR | Status: DC | PRN
  Administered 2016-07-18: 1000 mL

## 2016-07-18 MED ORDER — MIDAZOLAM HCL 2 MG/2ML IJ SOLN
INTRAMUSCULAR | Status: AC
  Filled 2016-07-18: qty 2

## 2016-07-18 MED ORDER — OXYCODONE-ACETAMINOPHEN 5-325 MG PO TABS
ORAL_TABLET | ORAL | Status: AC
  Administered 2016-07-18: 1 via ORAL
  Filled 2016-07-18: qty 1

## 2016-07-18 SURGICAL SUPPLY — 11 items

## 2016-07-18 NOTE — Research (Signed)
OPTIMIZE Informed Consent   Subject Name: [name] [name]  Subject met inclusion and exclusion criteria.  The informed consent form, study requirements and expectations were reviewed with the subject and questions and concerns were addressed prior to the signing of the consent form.  The subject verbalized understanding of the trail requirements.  The subject agreed to participate in the OPTIMIZE trial and signed the informed consent.  The informed consent was obtained prior to performance of any protocol-specific procedures for the subject.  A copy of the signed informed consent was given to the subject and a copy was placed in the subject's medical record. Applicable only if randomized.  [name]/ [name] 07/18/2016, 1040 AM

## 2016-07-18 NOTE — H&P (View-Only) (Signed)
Cardiology Office Note                              History and Physical    Date:  07/17/2016   ID:  Jim [name] Crull, DOB 11/29/1960, MRN [id number]  PCP:  SASSER,PAUL W, MD  Cardiologist:  Klein/ Briannie Gutierrez, NP   Chief Complaint  Patient presents with  . Chest Pain  . Shortness of Breath   History of Present Illness: [name] [name] is a 56 y.o. female who presents for complaints of chest pain. She is normally followed by Dr. Klein in the setting of tachycardia induced cardiomyopathy, s/p AV ablation and DDD pacemaker, 1995, with generator replacement in April of 2008. Other history includes atrial fib, chronic diastolic CHF, mitral valve regurgitation, and chronic dyspnea.   The patient was seen by Dr. Sasser with complaints of                      . As a result, Dr. Klein has recommended cardiac cath, which is planned for 07/18/2016.     Past Medical History:  Diagnosis Date  . Anemia   . Atrial fibrillation (HCC)   . Chronic diastolic heart failure (HCC)    EF 40-45%  . Depression    history of depression in the past.  . GERD (gastroesophageal reflux disease)   . Mitral valvular regurgitation   . Pacemaker- medtronic    Initial PM 1995; generator 2009, infection with contralateral implant 2010  . S/P AV nodal ablation   . Shortness of breath dyspnea     Past Surgical History:  Procedure Laterality Date  . ABDOMINAL ADHESION SURGERY  04/2006  . CARDIAC CATHETERIZATION  09/11/2007  . CARDIAC PACEMAKER PLACEMENT  1995   [facility],Medtronic Adapta ADDRL1  . CATARACT EXTRACTION, BILATERAL    . GASTRIC BYPASS  09/2003  . TEE WITHOUT CARDIOVERSION N/A 03/19/2014   Procedure: TRANSESOPHAGEAL ECHOCARDIOGRAM (TEE);  Surgeon: Jonathan F Branch, MD;  Location: [facility];  Service: Endoscopy;  Laterality: N/A;     Current Outpatient Prescriptions  Medication Sig Dispense Refill  . aspirin 81 MG tablet Take 1 tablet (81 mg total) by mouth daily. 30  tablet 3  . atorvastatin (LIPITOR) 10 MG tablet Take 10 mg by mouth every evening.    . carvedilol (COREG) 6.25 MG tablet Take 6.25 mg by mouth 2 (two) times daily with a meal.    . clonazePAM (KLONOPIN) 0.5 MG tablet Take 0.5 mg by mouth 3 (three) times daily as needed for anxiety.     . cyanocobalamin (,VITAMIN B-12,) 1000 MCG/ML injection Inject 1,000 mcg into the muscle every 30 (thirty) days.     . enalapril (VASOTEC) 10 MG tablet Take 1 tablet (10 mg total) by mouth daily. 90 tablet 3  . furosemide (LASIX) 40 MG tablet TAKE ONE TABLET BY MOUTH TWICE DAILY 180 tablet 2  . Melatonin 10 MG TABS Take 1 tablet by mouth daily.    . omeprazole (PRILOSEC) 20 MG capsule Take 20 mg by mouth daily.    . sertraline (ZOLOFT) 100 MG tablet Take 100 mg by mouth at bedtime.     . traZODone (DESYREL) 100 MG tablet Take 100 mg by mouth at bedtime.     No current facility-administered medications for this visit.     Allergies:   Patient has no known allergies.    Social History:  The   patient  reports that she has never smoked. She has never used smokeless tobacco. She reports that she drinks about 4.2 oz of alcohol per week . She reports that she does not use drugs.   Family History:  The patient's family history includes Arthritis in her sister; Arthritis-Osteo in her sister, sister, and sister; Atrial fibrillation in her sister; CAD in her brother, brother, and sister; Cancer in her sister; Cancer - Prostate in her father; Depression in her sister; Diabetes in her mother and sister; Fibromyalgia in her sister; Heart attack in her brother and brother; Heart failure in her mother; Hypertension in her brother, brother, mother, sister, sister, and sister; Mitral valve prolapse in her sister; Osteoporosis in her sister; Pneumonia in her sister; Stroke in her mother.    ROS: All other systems are reviewed and negative. Unless otherwise mentioned in [name]&P    PHYSICAL EXAM: VS:  BP 108/76   Pulse 65   Ht 5'  5" (1.651 m)   Wt 192 lb (87.1 kg)   SpO2 96%   BMI 31.95 kg/m  , BMI Body mass index is 31.95 kg/m. GEN: Well nourished, well developed, in no acute distress  HEENT: normal  Neck: no JVD, carotid bruits, or masses Cardiac: RRR; split S2, no, rubs, or gallops,no edema  Respiratory:  clear to auscultation bilaterally, normal work of breathing GI: soft, nontender, nondistended, + BS MS: no deformity or atrophy  Skin: warm and dry, no rash Neuro:  Strength and sensation are intact Psych: euthymic mood, full affect   EKG:  Paced rhythm  Recent Labs: No results found for requested labs within last 8760 hours. See scanned documennts.    Lipid Panel No results found for: CHOL, TRIG, HDL, CHOLHDL, VLDL, LDLCALC, LDLDIRECT    Wt Readings from Last 3 Encounters:  07/17/16 192 lb (87.1 kg)  03/30/16 192 lb 3.2 oz (87.2 kg)  12/15/14 194 lb 3.2 oz (88.1 kg)      Other studies Reviewed: Echocardiogram           *[facility]*            [address]            [place], [place] [address]              [phone]  ------------------------------------------------------------------- Transesophageal Echocardiography  Patient:  [name] [name] MR #:    [id number] Study Date: 03/19/2014 Gender:   F Age:    52 Height:   165.1 cm Weight:   99.8 kg BSA:    2.18 m^2 Pt. Status: Room:    [facility]  SONOGRAPHER Cynthia Rigg, RDCS ADMITTING  Jonathon Branch, M.D. ATTENDING  Jonathon Branch, M.D. ORDERING   Jonathon Branch, M.D. REFERRING  Jonathon Branch, M.D. PERFORMING  [facility] [place]  cc:  ------------------------------------------------------------------- LV EF: 50% -  55%  ------------------------------------------------------------------- Indications:   Mitral regurgitation 424.0.  ------------------------------------------------------------------- History:  PMH:   Congestive heart failure. Risk factors: cardiomyopathy.  ------------------------------------------------------------------- Study Conclusions  - Left ventricle: Systolic function was normal. The estimated ejection fraction was in the range of 50% to 55%. Wall motion was normal; there were no regional wall motion abnormalities. - Mitral valve: There appears to be two distinct regurgitant jets. Both are mild by vena contracta measurement (0.2 and 0.3 cm). Composite of regurgitation is in the mild to moderate range. PISA is not performed, inaccurate in the setting of multiple regurgitant jets. - Left atrium: The atrium was mildly to   moderately dilated. No evidence of thrombus in the atrial cavity or appendage. - Tricuspid valve: There was moderate regurgitation. - Systemic veins: There was systolic blunting of pulmonary vein flow consistent with elevated LA pressure. - Technically adequate study.  ------------------------------------------------------------------- Labs, prior tests, procedures, and surgery: AV nodal ablation. Permanent pacemaker system implantation. Diagnostic transesophageal echocardiography. 2D and color Doppler. Birthdate: Patient birthdate: 07/03/1960. Age: Patient is 56 yr old. Sex: Gender: female.  BMI: 36.6 kg/m^2. Blood pressure: 121/67 Patient status: Outpatient. Study date: Study date: 03/19/2014. Study time: 11:35 AM. Location: Endoscopy.  -------------------------------------------------------------------  ------------------------------------------------------------------- Left ventricle: Systolic function was normal. The estimated ejection fraction was in the range of 50% to 55%. Wall motion was normal; there were no regional wall motion abnormalities.  ------------------------------------------------------------------- Aortic valve:  Structurally normal valve.  Cusp separation was normal. Doppler:  There  was no stenosis.  There was no regurgitation.  ------------------------------------------------------------------- Aorta: Aortic root: The aortic root was normal in size. The visualized portions of the aortic arch and descending thoracic aorta are normal in size without significant atherosclerosis.  ------------------------------------------------------------------- Mitral valve: There appears to be two distinct regurgitant jets. Both are mild by vena contracta measurement (0.2 and 0.3 cm). Composite of regurgitation is in the mild to moderate range. PISA is not performed, inaccurate in the setting of multiple regurgitant jets. Normal thickness leaflets . Doppler:   Peak gradient (D): 3 mm Hg.  ------------------------------------------------------------------- Left atrium: The atrium was mildly to moderately dilated. No evidence of thrombus in the atrial cavity or appendage. The appendage was of normal size. Emptying velocity was normal at 53 cm/s.  ------------------------------------------------------------------- Atrial septum: The interatrial septum is intact with no evidence of shunting by color Doppler.  ------------------------------------------------------------------- Right ventricle: The cavity size was normal. Wall thickness was normal. Pacer wire or catheter noted in right ventricle. Systolic function was normal.  ------------------------------------------------------------------- Pulmonic valve:  Not well visualized. Doppler:  There was no evidence for stenosis.  There was no significant regurgitation.  ------------------------------------------------------------------- Tricuspid valve:  Normal thickness leaflets. Doppler:  There was no evidence for stenosis.  There was moderate regurgitation.  ------------------------------------------------------------------- Right atrium: Poorly visualized. Pacer wire or catheter noted in right  atrium.  ------------------------------------------------------------------- Pericardium: There was no pericardial effusion.  ------------------------------------------------------------------- Systemic veins: There was systolic blunting of pulmonary vein flow consistent with elevated LA pressure.  ------------------------------------------------------------------- Measurements  Aorta                   Value    Reference Aortic root ID              33  mm  ---------  Mitral valve               Value    Reference Mitral E-wave peak velocity        91.6 cm/s --------- Mitral A-wave peak velocity        28.8 cm/s --------- Mitral deceleration time       ([name]) 254  ms  150 - 230 Mitral peak gradient, D          3   mm Hg --------- Mitral E/A ratio, peak          3.2     --------- Mitral maximal regurg velocity, PISA   468  cm/s --------- Mitral regurg VTI, PISA          160  cm  ---------  Legend: (L) and ([name]) mark values outside specified reference range.  ------------------------------------------------------------------- Prepared and Electronically Authenticated   by  Jonathon Branch, M.D. 2015-12-10T16:00:40 ASSESSMENT AND PLAN:  1.  Chest Pain: Worrisome for angina. Patient is scheduled for cardiac catheterization in a.m. for diagnostic prognostic purposes.The patient understands that risks include but are not limited to stroke (1 in 1000), death (1 in 1000), kidney failure [usually temporary] (1 in 500), bleeding (1 in 200), allergic reaction [possibly serious] (1 in 200), and agrees to proceed. She will hold Lasix in the a.m.  2. Hypertension: Currently well-controlled on current medication regimen.  3. Tachycardia-induced cardiomyopathy: Continue beta blocker therapy, ACE inhibitor, and Lasix.  4. Hyper cholesterolemia: Continue statin  therapy.   Current medicines are reviewed at length with the patient today.    Labs/ tests ordered today include:  No orders of the defined types were placed in this encounter.    Disposition:   FU with post catheterization with Dr. Klein.  Signed, Shuntay Everetts, NP  07/17/2016 5:14 PM     [facility] [address], [place], [place] [address] Phone: [phone]; Fax: [phone] 

## 2016-07-18 NOTE — Discharge Instructions (Signed)
°  HOLD COREG TONIGHT  AND RESUME TOMORROW  Radial Site Care Refer to this sheet in the next few weeks. These instructions provide you with information about caring for yourself after your procedure. Your health care provider may also give you more specific instructions. Your treatment has been planned according to current medical practices, but problems sometimes occur. Call your health care provider if you have any problems or questions after your procedure. What can I expect after the procedure? After your procedure, it is typical to have the following:  Bruising at the radial site that usually fades within 1-2 weeks.  Blood collecting in the tissue (hematoma) that may be painful to the touch. It should usually decrease in size and tenderness within 1-2 weeks. Follow these instructions at home:  Take medicines only as directed by your health care provider.  You may shower 24-48 hours after the procedure or as directed by your health care provider. Remove the bandage (dressing) and gently wash the site with plain soap and water. Pat the area dry with a clean towel. Do not rub the site, because this may cause bleeding.  Do not take baths, swim, or use a hot tub until your health care provider approves.  Check your insertion site every day for redness, swelling, or drainage.  Do not apply powder or lotion to the site.  Do not flex or bend the affected arm for 24 hours or as directed by your health care provider.  Do not push or pull heavy objects with the affected arm for 24 hours or as directed by your health care provider.  Do not lift over 10 lb (4.5 kg) for 5 days after your procedure or as directed by your health care provider.  Ask your health care provider when it is okay to:  Return to work or school.  Resume usual physical activities or sports.  Resume sexual activity.  Do not drive home if you are discharged the same day as the procedure. Have someone else drive  you.  You may drive 24 hours after the procedure unless otherwise instructed by your health care provider.  Do not operate machinery or power tools for 24 hours after the procedure.  If your procedure was done as an outpatient procedure, which means that you went home the same day as your procedure, a responsible adult should be with you for the first 24 hours after you arrive home.  Keep all follow-up visits as directed by your health care provider. This is important. Contact a health care provider if:  You have a fever.  You have chills.  You have increased bleeding from the radial site. Hold pressure on the site. Get help right away if:  You have unusual pain at the radial site.  You have redness, warmth, or swelling at the radial site.  You have drainage (other than a small amount of blood on the dressing) from the radial site.  The radial site is bleeding, and the bleeding does not stop after 30 minutes of holding steady pressure on the site.  Your arm or hand becomes pale, cool, tingly, or numb. This information is not intended to replace advice given to you by your health care provider. Make sure you discuss any questions you have with your health care provider. Document Released: 04/29/2010 Document Revised: 09/02/2015 Document Reviewed: 10/13/2013 Elsevier Interactive Patient Education  2017 [employer].

## 2016-07-18 NOTE — Interval H&P Note (Signed)
Cath Lab Visit (complete for each Cath Lab visit)  Clinical Evaluation Leading to the Procedure:   ACS: No.  Non-ACS:    Anginal Classification: CCS III  Anti-ischemic medical therapy: Minimal Therapy (1 class of medications)  Non-Invasive Test Results: No non-invasive testing performed  Prior CABG: No previous CABG      History and Physical Interval Note:  07/18/2016 11:31 AM  [name] [name]  has presented today for surgery, with the diagnosis of cp  The various methods of treatment have been discussed with the patient and family. After consideration of risks, benefits and other options for treatment, the patient has consented to  Procedure(s): Left Heart Cath and Coronary Angiography (N/A) as a surgical intervention .  The patient's history has been reviewed, patient examined, no change in status, stable for surgery.  I have reviewed the patient's chart and labs.  Questions were answered to the patient's satisfaction.     Nicki Guadalajara

## 2016-07-18 NOTE — Progress Notes (Signed)
Lillia Abed PA notified of b/p and 250cc nss bolus given and per Lillia Abed PA ok to d/c if b/p > 100 and advise client to hold coreg tonight

## 2016-07-18 NOTE — Progress Notes (Signed)
B/P 107 systolic and advised client to hold coreg tonight and client voiced understanding

## 2016-09-28 ENCOUNTER — Telehealth: Payer: Self-pay | Admitting: Cardiology

## 2016-09-28 ENCOUNTER — Encounter: Payer: PRIVATE HEALTH INSURANCE | Admitting: *Deleted

## 2016-09-28 NOTE — Telephone Encounter (Signed)
LMOVM reminding pt to send remote transmission.   

## 2016-09-29 ENCOUNTER — Encounter: Payer: Self-pay | Admitting: Cardiology

## 2016-10-10 ENCOUNTER — Ambulatory Visit (INDEPENDENT_AMBULATORY_CARE_PROVIDER_SITE_OTHER): Payer: Commercial Managed Care - PPO | Admitting: *Deleted

## 2016-10-10 DIAGNOSIS — I442 Atrioventricular block, complete: Secondary | ICD-10-CM

## 2016-10-12 NOTE — Progress Notes (Signed)
Remote pacemaker transmission.   

## 2016-10-13 ENCOUNTER — Encounter: Payer: Self-pay | Admitting: Cardiology

## 2016-10-13 LAB — CUP PACEART REMOTE DEVICE CHECK
Battery Impedance: 942 Ohm
Battery Remaining Longevity: 60 mo
Battery Voltage: 2.77 V
Brady Statistic AP VP Percent: 11 %
Brady Statistic AP VS Percent: 0 %
Brady Statistic AS VP Percent: 89 %
Brady Statistic AS VS Percent: 0 %
Date Time Interrogation Session: 20180703164353
Implantable Lead Implant Date: 20100201
Implantable Lead Implant Date: 20100201
Implantable Lead Location: 753859
Implantable Lead Location: 753860
Implantable Lead Model: 5076
Implantable Lead Model: 5076
Implantable Pulse Generator Implant Date: 20100201
Lead Channel Impedance Value: 425 Ohm
Lead Channel Impedance Value: 535 Ohm
Lead Channel Pacing Threshold Amplitude: 0.875 V
Lead Channel Pacing Threshold Pulse Width: 0.4 ms
Lead Channel Sensing Intrinsic Amplitude: 0.7 mV
Lead Channel Setting Pacing Amplitude: 2 V
Lead Channel Setting Pacing Amplitude: 2.5 V
Lead Channel Setting Pacing Pulse Width: 0.4 ms
Lead Channel Setting Sensing Sensitivity: 2 mV

## 2017-01-09 ENCOUNTER — Encounter: Payer: Commercial Managed Care - PPO | Admitting: *Deleted

## 2017-01-09 ENCOUNTER — Telehealth: Payer: Self-pay | Admitting: Cardiology

## 2017-01-09 NOTE — Telephone Encounter (Signed)
LMOVM reminding pt to send remote transmission.   

## 2017-01-10 ENCOUNTER — Encounter: Payer: Self-pay | Admitting: Cardiology

## 2017-01-18 ENCOUNTER — Ambulatory Visit (INDEPENDENT_AMBULATORY_CARE_PROVIDER_SITE_OTHER): Payer: Commercial Managed Care - PPO | Admitting: *Deleted

## 2017-01-18 DIAGNOSIS — I442 Atrioventricular block, complete: Secondary | ICD-10-CM | POA: Diagnosis not present

## 2017-01-19 ENCOUNTER — Encounter: Payer: Self-pay | Admitting: Cardiology

## 2017-01-19 NOTE — Progress Notes (Signed)
Remote pacemaker transmission.   

## 2017-01-29 LAB — CUP PACEART REMOTE DEVICE CHECK
Battery Impedance: 1019 Ohm
Battery Remaining Longevity: 56 mo
Battery Voltage: 2.77 V
Brady Statistic AP VP Percent: 12 %
Brady Statistic AP VS Percent: 0 %
Brady Statistic AS VP Percent: 88 %
Brady Statistic AS VS Percent: 0 %
Date Time Interrogation Session: 20181011160419
Implantable Lead Implant Date: 20100201
Implantable Lead Implant Date: 20100201
Implantable Lead Location: 753859
Implantable Lead Location: 753860
Implantable Lead Model: 5076
Implantable Lead Model: 5076
Implantable Pulse Generator Implant Date: 20100201
Lead Channel Impedance Value: 412 Ohm
Lead Channel Impedance Value: 535 Ohm
Lead Channel Pacing Threshold Amplitude: 0.875 V
Lead Channel Pacing Threshold Pulse Width: 0.4 ms
Lead Channel Setting Pacing Amplitude: 2 V
Lead Channel Setting Pacing Amplitude: 2.5 V
Lead Channel Setting Pacing Pulse Width: 0.4 ms
Lead Channel Setting Sensing Sensitivity: 2 mV

## 2017-03-28 ENCOUNTER — Encounter: Payer: Commercial Managed Care - PPO | Admitting: Internal Medicine

## 2017-04-18 NOTE — Progress Notes (Signed)
Electrophysiology Office Note Date: 04/19/2017  ID:  [name] [name], DOB 28-Jul-1960, MRN [id number]  PCP: Estanislado Pandy, MD Electrophysiologist: Graciela Husbands  CC: Pacemaker follow-up  [name] [name] is a 57 y.o. female seen today for Dr Graciela Husbands.  She presents today for routine electrophysiology followup.  Since last being seen in our clinic, the patient reports doing reasonably well. She recently retired from [employer]. Her exercise tolerance and shortness of breath are stable. She has not had recurrent chest pain since cath.  She denies chest pain, palpitations, PND, orthopnea, nausea, vomiting, dizziness, syncope, edema, weight gain, or early satiety.  Device History: MDT dual chamber PPM implanted 1995 for complete heart block; gen change 2009; extraction 2010 2/2 infection with reimplantation on right side   Past Medical History:  Diagnosis Date  . Anemia   . Atrial fibrillation (HCC)   . Chronic diastolic heart failure (HCC)    EF 40-45%  . Depression    history of depression in the past.  . GERD (gastroesophageal reflux disease)   . Mitral valvular regurgitation   . Pacemaker- medtronic    Initial PM 1995; generator 2009, infection with contralateral implant 2010  . S/P AV nodal ablation   . Shortness of breath dyspnea    Past Surgical History:  Procedure Laterality Date  . ABDOMINAL ADHESION SURGERY  04/2006  . CARDIAC CATHETERIZATION  09/11/2007  . CARDIAC PACEMAKER PLACEMENT  1995   [facility],Medtronic Adapta ADDRL1  . CATARACT EXTRACTION, BILATERAL    . GASTRIC BYPASS  09/2003  . LEFT HEART CATH AND CORONARY ANGIOGRAPHY N/A 07/18/2016   Procedure: Left Heart Cath and Coronary Angiography;  Surgeon: Lennette Bihari, MD;  Location: [facility] INVASIVE CV LAB;  Service: Cardiovascular;  Laterality: N/A;  . TEE WITHOUT CARDIOVERSION N/A 03/19/2014   Procedure: TRANSESOPHAGEAL ECHOCARDIOGRAM (TEE);  Surgeon: Antoine Poche, MD;  Location[facility];   Service: Endoscopy;  Laterality: N/A;    Current Outpatient Medications  Medication Sig Dispense Refill  . aspirin 81 MG tablet Take 1 tablet (81 mg total) by mouth daily. 30 tablet 3  . atorvastatin (LIPITOR) 10 MG tablet Take 10 mg by mouth every evening.    . carvedilol (COREG) 6.25 MG tablet Take 6.25 mg by mouth 2 (two) times daily with a meal.    . clonazePAM (KLONOPIN) 0.5 MG tablet Take 0.5 mg by mouth 3 (three) times daily as needed for anxiety.     . cyanocobalamin (,VITAMIN B-12,) 1000 MCG/ML injection Inject 1,000 mcg into the muscle every 30 (thirty) days.     . enalapril (VASOTEC) 10 MG tablet Take 1 tablet (10 mg total) by mouth daily. 90 tablet 3  . furosemide (LASIX) 40 MG tablet TAKE ONE TABLET BY MOUTH TWICE DAILY 180 tablet 2  . Melatonin 10 MG TABS Take 1 tablet by mouth daily.    Marland Kitchen omeprazole (PRILOSEC) 20 MG capsule Take 20 mg by mouth daily.    . sertraline (ZOLOFT) 100 MG tablet Take 100 mg by mouth at bedtime.     . traZODone (DESYREL) 100 MG tablet Take 100 mg by mouth at bedtime.     No current facility-administered medications for this visit.     Allergies:   Patient has no known allergies.   Social History: Social History   Socioeconomic History  . Marital status: Married    Spouse name: Not on file  . Number of children: Not on file  . Years of education:  Not on file  . Highest education level: Not on file  Social Needs  . Financial resource strain: Not on file  . Food insecurity - worry: Not on file  . Food insecurity - inability: Not on file  . Transportation needs - medical: Not on file  . Transportation needs - non-medical: Not on file  Occupational History  . Not on file  Tobacco Use  . Smoking status: Never Smoker  . Smokeless tobacco: Never Used  Substance and Sexual Activity  . Alcohol use: Yes    Alcohol/week: 4.2 oz    Types: 7 Cans of beer per week  . Drug use: No  . Sexual activity: Not on file  Other Topics Concern  . Not on  file  Social History Narrative  . Not on file    Family History: Family History  Problem Relation Age of Onset  . Diabetes Mother   . Hypertension Mother   . Stroke Mother   . Heart failure Mother   . Cancer - Prostate Father   . Arthritis-Osteo Sister   . Hypertension Sister   . Diabetes Sister   . CAD Sister   . Arthritis Sister   . Hypertension Sister   . Arthritis-Osteo Sister   . Atrial fibrillation Sister   . Cancer Sister   . Pneumonia Sister   . Osteoporosis Sister   . Hypertension Sister   . Mitral valve prolapse Sister   . Arthritis-Osteo Sister   . Fibromyalgia Sister   . Depression Sister   . Hypertension Brother   . CAD Brother   . Heart attack Brother   . Hypertension Brother   . CAD Brother   . Heart attack Brother      Review of Systems: All other systems reviewed and are otherwise negative except as noted above.   Physical Exam: VS:  BP (!) 84/62   Pulse 88   Resp 16   Ht 5\' 5"  (1.651 m)   Wt 204 lb 12.8 oz (92.9 kg)   SpO2 94%   BMI 34.08 kg/m  , BMI Body mass index is 34.08 kg/m.  GEN- The patient is elderly and obese appearing, alert and oriented x 3 today.   HEENT: normocephalic, atraumatic; sclera clear, conjunctiva pink; hearing intact; oropharynx clear; neck supple  Lungs- Clear to ausculation bilaterally, normal work of breathing.  No wheezes, rales, rhonchi Heart- Regular rate and rhythm (paced) GI- soft, non-tender, non-distended, bowel sounds present  Extremities- no clubbing, cyanosis, or edema  MS- no significant deformity or atrophy Skin- warm and dry, no rash or lesion; PPM pocket well healed Psych- euthymic mood, full affect Neuro- strength and sensation are intact  PPM Interrogation- reviewed in detail today,  See PACEART report  EKG:  EKG is not ordered today.  Recent Labs: No results found for requested labs within last 8760 hours.   Wt Readings from Last 3 Encounters:  04/19/17 204 lb 12.8 oz (92.9 kg)    07/18/16 192 lb (87.1 kg)  07/17/16 192 lb (87.1 kg)     Other studies Reviewed: Additional studies/ records that were reviewed today include: Dr Odessa Fleming office notes  Assessment and Plan:  1.  Complete heart block Normal PPM function See Pace Art report No changes today She has previously declined device reprogramming to promote AV synchrony  2.  Chronic systolic heart failure Stable No change required today She is relatively asymptomatic. If symptoms progress, could consider CRT upgrade with LV dysfunction and complete heart block  Current medicines are reviewed at length with the patient today.   The patient does not have concerns regarding her medicines.  The following changes were made today:  none  Labs/ tests ordered today include: none Orders Placed This Encounter  Procedures  . EKG 12-Lead     Disposition:   Follow up with [facility], Dr Graciela Husbands 1 year    Signed, Gypsy Balsam, NP 04/19/2017 11:30 AM  [facility] [address] [place] [address] [phone] (office) [phone] (fax)

## 2017-04-19 ENCOUNTER — Ambulatory Visit: Payer: Commercial Managed Care - PPO | Admitting: Nurse Practitioner

## 2017-04-19 ENCOUNTER — Encounter: Payer: Commercial Managed Care - PPO | Admitting: *Deleted

## 2017-04-19 ENCOUNTER — Telehealth: Payer: Self-pay | Admitting: Cardiology

## 2017-04-19 ENCOUNTER — Encounter: Payer: Self-pay | Admitting: Nurse Practitioner

## 2017-04-19 VITALS — BP 84/62 | HR 88 | Resp 16 | Ht 65.0 in | Wt 204.8 lb

## 2017-04-19 DIAGNOSIS — I442 Atrioventricular block, complete: Secondary | ICD-10-CM | POA: Diagnosis not present

## 2017-04-19 DIAGNOSIS — I5022 Chronic systolic (congestive) heart failure: Secondary | ICD-10-CM | POA: Diagnosis not present

## 2017-04-19 NOTE — Patient Instructions (Addendum)
Medication Instructions:   Your physician recommends that you continue on your current medications as directed. Please refer to the Current Medication list given to you today.   If you need a refill on your cardiac medications before your next appointment, please call your pharmacy.  Labwork:  NONE ORDERED  TODAY   Testing/Procedures:  NONE ORDERED  TODAY    Follow-Up:  Your physician wants you to follow-up in: ONE YEAR WITH Graciela Husbands  You will receive a reminder letter in the mail two months in advance. If you don't receive a letter, please call our office to schedule the follow-up appointment.    Remote monitoring is used to monitor your Pacemaker of ICD from home. This monitoring reduces the number of office visits required to check your device to one time per year. It allows [place] to keep an eye on the functioning of your device to ensure it is working properly. You are scheduled for a device check from home on . 07-19-2017 You may send your transmission at any time that day. If you have a wireless device, the transmission will be sent automatically. After your physician reviews your transmission, you will receive a postcard with your next transmission date.     Any Other Special Instructions Will Be Listed Below (If Applicable).

## 2017-04-19 NOTE — Telephone Encounter (Signed)
Confirmed remote transmission w/ pt. She will not be sending remote transmission b/c she seen AS, NP today and her PPM checked at that time.

## 2017-05-02 LAB — CUP PACEART INCLINIC DEVICE CHECK
Date Time Interrogation Session: 20190123084747
Implantable Lead Implant Date: 20100201
Implantable Lead Implant Date: 20100201
Implantable Lead Location: 753859
Implantable Lead Location: 753860
Implantable Lead Model: 5076
Implantable Lead Model: 5076
Implantable Pulse Generator Implant Date: 20100201

## 2017-07-19 ENCOUNTER — Ambulatory Visit (INDEPENDENT_AMBULATORY_CARE_PROVIDER_SITE_OTHER): Payer: BLUE CROSS/BLUE SHIELD | Admitting: *Deleted

## 2017-07-19 ENCOUNTER — Telehealth: Payer: Self-pay | Admitting: Cardiology

## 2017-07-19 DIAGNOSIS — I442 Atrioventricular block, complete: Secondary | ICD-10-CM | POA: Diagnosis not present

## 2017-07-19 NOTE — Telephone Encounter (Signed)
Spoke with pt and reminded pt of remote transmission that is due today. Pt verbalized understanding.   

## 2017-07-23 NOTE — Progress Notes (Signed)
Remote pacemaker transmission.   

## 2017-07-25 ENCOUNTER — Encounter: Payer: Self-pay | Admitting: Cardiology

## 2017-08-24 LAB — CUP PACEART REMOTE DEVICE CHECK
Battery Impedance: 1284 Ohm
Battery Remaining Longevity: 49 mo
Battery Voltage: 2.77 V
Brady Statistic AP VP Percent: 14 %
Brady Statistic AP VS Percent: 0 %
Brady Statistic AS VP Percent: 86 %
Brady Statistic AS VS Percent: 0 %
Date Time Interrogation Session: 20190415145609
Implantable Lead Implant Date: 20100201
Implantable Lead Implant Date: 20100201
Implantable Lead Location: 753859
Implantable Lead Location: 753860
Implantable Lead Model: 5076
Implantable Lead Model: 5076
Implantable Pulse Generator Implant Date: 20100201
Lead Channel Impedance Value: 431 Ohm
Lead Channel Impedance Value: 529 Ohm
Lead Channel Pacing Threshold Amplitude: 1 V
Lead Channel Pacing Threshold Pulse Width: 0.4 ms
Lead Channel Setting Pacing Amplitude: 2 V
Lead Channel Setting Pacing Amplitude: 2.5 V
Lead Channel Setting Pacing Pulse Width: 0.4 ms
Lead Channel Setting Sensing Sensitivity: 2 mV

## 2017-10-18 ENCOUNTER — Encounter: Payer: BLUE CROSS/BLUE SHIELD | Admitting: *Deleted

## 2017-10-18 ENCOUNTER — Telehealth: Payer: Self-pay | Admitting: Cardiology

## 2017-10-18 NOTE — Telephone Encounter (Signed)
Attempted to confirm remote transmission with pt. No answer and was unable to leave a message.   

## 2017-10-19 ENCOUNTER — Encounter: Payer: Self-pay | Admitting: Cardiology

## 2018-01-22 ENCOUNTER — Telehealth: Payer: Self-pay | Admitting: Cardiology

## 2018-01-22 ENCOUNTER — Encounter: Payer: Self-pay | Admitting: *Deleted

## 2018-01-22 NOTE — Telephone Encounter (Signed)
Attempted to confirm remote transmission with pt. No answer and was unable to leave a message.   

## 2018-01-24 ENCOUNTER — Encounter: Payer: Self-pay | Admitting: Cardiology

## 2018-04-26 ENCOUNTER — Ambulatory Visit: Payer: Self-pay | Admitting: Internal Medicine

## 2018-04-26 ENCOUNTER — Ambulatory Visit (INDEPENDENT_AMBULATORY_CARE_PROVIDER_SITE_OTHER): Payer: BLUE CROSS/BLUE SHIELD | Admitting: Internal Medicine

## 2018-04-26 ENCOUNTER — Encounter: Payer: Self-pay | Admitting: Internal Medicine

## 2018-04-26 ENCOUNTER — Encounter (INDEPENDENT_AMBULATORY_CARE_PROVIDER_SITE_OTHER): Payer: Self-pay

## 2018-04-26 VITALS — BP 112/86 | HR 70 | Ht 65.0 in | Wt 204.8 lb

## 2018-04-26 DIAGNOSIS — I442 Atrioventricular block, complete: Secondary | ICD-10-CM | POA: Diagnosis not present

## 2018-04-26 DIAGNOSIS — Z950 Presence of cardiac pacemaker: Secondary | ICD-10-CM | POA: Diagnosis not present

## 2018-04-26 DIAGNOSIS — I5022 Chronic systolic (congestive) heart failure: Secondary | ICD-10-CM | POA: Diagnosis not present

## 2018-04-26 DIAGNOSIS — I340 Nonrheumatic mitral (valve) insufficiency: Secondary | ICD-10-CM

## 2018-04-26 LAB — CUP PACEART INCLINIC DEVICE CHECK
Battery Impedance: 1419 Ohm
Battery Remaining Longevity: 45 mo
Battery Voltage: 2.77 V
Brady Statistic AP VP Percent: 14 %
Brady Statistic AP VS Percent: 0 %
Brady Statistic AS VP Percent: 86 %
Brady Statistic AS VS Percent: 0 %
Date Time Interrogation Session: 20200117145505
Implantable Lead Implant Date: 20100201
Implantable Lead Implant Date: 20100201
Implantable Lead Location: 753859
Implantable Lead Location: 753860
Implantable Lead Model: 5076
Implantable Lead Model: 5076
Implantable Pulse Generator Implant Date: 20100201
Lead Channel Impedance Value: 429 Ohm
Lead Channel Impedance Value: 513 Ohm
Lead Channel Pacing Threshold Amplitude: 1 V
Lead Channel Pacing Threshold Amplitude: 1 V
Lead Channel Pacing Threshold Amplitude: 2 V
Lead Channel Pacing Threshold Pulse Width: 0.4 ms
Lead Channel Pacing Threshold Pulse Width: 0.4 ms
Lead Channel Pacing Threshold Pulse Width: 0.4 ms
Lead Channel Sensing Intrinsic Amplitude: 0.7 mV
Lead Channel Sensing Intrinsic Amplitude: 2.8 mV
Lead Channel Setting Pacing Amplitude: 2 V
Lead Channel Setting Pacing Amplitude: 2.5 V
Lead Channel Setting Pacing Pulse Width: 0.4 ms
Lead Channel Setting Sensing Sensitivity: 2 mV

## 2018-04-26 NOTE — Progress Notes (Signed)
Patient Care Team: Estanislado Pandy, MD as PCP - General (Cardiology) [name], MD (Cardiology) Hillis Range, MD (Cardiology)   HPI  [name] [name] is a 58 y.o. 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 She underwent generator replacement in April of 2008.  Marland Kitchen  Her dyspnea is much improved following up titration of her diuretics. She had a syncopal episode in December. She saw Dr. Lavella Lemons  he undertook an echocardiogram to assess her mitral valve  regurgitation was felt to be mild.  Ejection fraction was somewhat improved   compare with 2009 at 55%. There is been gradual incremental improvement ever since her AV ablation in the 1990s  Catheterization undertaken for chest pain  DATE TEST EF   10/15 Echo   55 % MR mod-severe  12/15 TEE  55 % MR 2 jets, mild-mod  4/18 LHC 35% CA normal   The patient has now retired fully.  She had intercurrent bronchitis which was associate with severe shortness of breath and nocturnal dyspnea and orthopnea.  Mostly she is back to her baseline functional status.  She is able to walk on the flats but not stairs.  No edema.  Past Medical History:  Diagnosis Date  . Anemia   . Atrial fibrillation (HCC)   . Chronic diastolic heart failure (HCC)    EF 40-45%  . Depression    history of depression in the past.  . GERD (gastroesophageal reflux disease)   . Mitral valvular regurgitation   . Pacemaker- medtronic    Initial PM 1995; generator 2009, infection with contralateral implant 2010  . S/P AV nodal ablation   . Shortness of breath dyspnea     Past Surgical History:  Procedure Laterality Date  . ABDOMINAL ADHESION SURGERY  04/2006  . CARDIAC CATHETERIZATION  09/11/2007  . CARDIAC PACEMAKER PLACEMENT  1995   [facility],Medtronic Adapta ADDRL1  . CATARACT EXTRACTION, BILATERAL    . GASTRIC BYPASS  09/2003  . LEFT HEART CATH AND CORONARY  ANGIOGRAPHY N/A 07/18/2016   Procedure: Left Heart Cath and Coronary Angiography;  Surgeon: Lennette Bihari, MD;  Location: [facility] INVASIVE CV LAB;  Service: Cardiovascular;  Laterality: N/A;  . TEE WITHOUT CARDIOVERSION N/A 03/19/2014   Procedure: TRANSESOPHAGEAL ECHOCARDIOGRAM (TEE);  Surgeon: Antoine Poche, MD;  Location: [facility];  Service: Endoscopy;  Laterality: N/A;    Current Outpatient Medications  Medication Sig Dispense Refill  . aspirin 81 MG tablet Take 1 tablet (81 mg total) by mouth daily. 30 tablet 3  . carvedilol (COREG) 6.25 MG tablet Take 6.25 mg by mouth 2 (two) times daily with a meal.    . cyanocobalamin (,VITAMIN B-12,) 1000 MCG/ML injection Inject 1,000 mcg into the muscle every 30 (thirty) days.     . enalapril (VASOTEC) 10 MG tablet Take 1 tablet (10 mg total) by mouth daily. 90 tablet 3  . furosemide (LASIX) 40 MG tablet TAKE ONE TABLET BY MOUTH TWICE DAILY 180 tablet 2  . Melatonin 10 MG TABS Take 1 tablet by mouth daily.    Marland Kitchen omeprazole (PRILOSEC) 20 MG capsule Take 20 mg by mouth daily.    . sertraline (ZOLOFT) 25 MG tablet Take 150 mg by mouth daily.    . traZODone (DESYREL) 100 MG tablet Take 3 tablets by mouth daily.     No current facility-administered medications for this visit.  No Known Allergies  Review of Systems negative except from HPI and PMH  Physical Exam BP 112/86   Pulse 70   Ht 5\' 5"  (1.651 m)   Wt 204 lb 12.8 oz (92.9 kg)   SpO2 94%   BMI 34.08 kg/m  Well developed and nourished in no acute distress HENT normal Neck supple with JVP-flat Clear Regular rate and rhythm, no murmurs or gallops Abd-soft with active BS No Clubbing cyanosis edema Skin-warm and dry A & Oriented  Grossly normal sensory and motor function    ECG atrial tachycardia with complete heart block and ventricular pacing Intervals-/15/47  Assessment and  Plan  Complete heart block status post AV junction ablation    Cardiomyopathy-nonischemic  ejection fraction 50-55 >> 35  HFpEF//HFrEF chronic  .   Pacemaker  medtronic The patient's device was interrogated.  The information was reviewed. No changes were made in the programming.      Atrial tachy    Syncope  Mitral regurgitation-mild-moderate  Euvolemic continue current meds  The patient was noted to have worsening LV dysfunction and catheterization 2018.  Potential explanations could include mitral regurgitation valvular cardiomyopathy, pacemaker induced cardiomyopathy.  Reassessment of LV function by echo and mitral regurgitation are indicated.  She may need TEE again to clarify the extent of her mitral regurgitation.  Would also potentially consider CRT upgrade.  Encouraged exercise. More than 50% of 40 min was spent in counseling related to the above

## 2018-04-26 NOTE — Patient Instructions (Signed)
Medication Instructions:  Your physician recommends that you continue on your current medications as directed. Please refer to the Current Medication list given to you today.  Labwork: None ordered.  Testing/Procedures: Your physician has requested that you have an echocardiogram. Echocardiography is a painless test that uses sound waves to create images of your heart. It provides your doctor with information about the size and shape of your heart and how well your heart's chambers and valves are working. This procedure takes approximately one hour. There are no restrictions for this procedure.   Follow-Up: Your physician recommends that you schedule a follow-up appointment in:    One Year with Dr Graciela Husbands   Remote monitoring is used to monitor your Pacemaker of ICD from home. This monitoring reduces the number of office visits required to check your device to one time per year. It allows [place] to keep an eye on the functioning of your device to ensure it is working properly. You are scheduled for a device check from home on 4/17. You may send your transmission at any time that day. If you have a wireless device, the transmission will be sent automatically. After your physician reviews your transmission, you will receive a postcard with your next transmission date.  Please schedule your Echo for our [facility] location.   Any Other Special Instructions Will Be Listed Below (If Applicable).     If you need a refill on your cardiac medications before your next appointment, please call your pharmacy.

## 2018-05-08 ENCOUNTER — Ambulatory Visit (INDEPENDENT_AMBULATORY_CARE_PROVIDER_SITE_OTHER): Payer: BLUE CROSS/BLUE SHIELD

## 2018-05-08 ENCOUNTER — Other Ambulatory Visit: Payer: Self-pay

## 2018-05-08 DIAGNOSIS — I340 Nonrheumatic mitral (valve) insufficiency: Secondary | ICD-10-CM | POA: Diagnosis not present

## 2018-05-08 DIAGNOSIS — I442 Atrioventricular block, complete: Secondary | ICD-10-CM

## 2018-05-14 ENCOUNTER — Telehealth: Payer: Self-pay

## 2018-05-14 NOTE — Telephone Encounter (Signed)
Patient called and stated she would like for the TEE to be set up at [facility] and be done be Dr. Wyline Mood.

## 2018-05-14 NOTE — Telephone Encounter (Signed)
-----   Message from [name], MD sent at 05/09/2018  1:31 PM EST ----- She has eccentric MR on her echo  can u please talk to her about TEE and whether she would prefer to have it done in [place] or at [facility]  Thanks

## 2018-05-14 NOTE — Telephone Encounter (Signed)
Pt aware of her echo results and recommendations. She states her TEE from 2015 showed mild to moderate MVR. She is not sure she would like to have another TEE at this time. She will call the office back with her decision at a later time.

## 2018-05-16 NOTE — Telephone Encounter (Signed)
Central Scheduling at [facility] has agreed to contact pt to schedule.

## 2018-05-21 ENCOUNTER — Other Ambulatory Visit: Payer: Self-pay

## 2018-05-21 ENCOUNTER — Ambulatory Visit (HOSPITAL_BASED_OUTPATIENT_CLINIC_OR_DEPARTMENT_OTHER)
Admission: RE | Admit: 2018-05-21 | Discharge: 2018-05-21 | Disposition: A | Discharge status: Home / Self Care | Payer: BLUE CROSS/BLUE SHIELD | Source: Home / Self Care | Attending: Cardiology | Admitting: Cardiology

## 2018-05-21 ENCOUNTER — Encounter (HOSPITAL_COMMUNITY)
Admission: RE | Disposition: A | Discharge status: Home / Self Care | Payer: Self-pay | Source: Home / Self Care | Attending: Cardiology

## 2018-05-21 ENCOUNTER — Inpatient Hospital Stay (HOSPITAL_COMMUNITY): Admission: RE | Admit: 2018-05-21 | Payer: BLUE CROSS/BLUE SHIELD | Source: Ambulatory Visit

## 2018-05-21 ENCOUNTER — Ambulatory Visit (HOSPITAL_COMMUNITY)
Admission: RE | Admit: 2018-05-21 | Discharge: 2018-05-21 | Disposition: A | Discharge status: Home / Self Care | Payer: BLUE CROSS/BLUE SHIELD | Attending: Cardiology | Admitting: Cardiology

## 2018-05-21 ENCOUNTER — Encounter (HOSPITAL_COMMUNITY): Payer: Self-pay | Admitting: *Deleted

## 2018-05-21 DIAGNOSIS — Z950 Presence of cardiac pacemaker: Secondary | ICD-10-CM | POA: Insufficient documentation

## 2018-05-21 DIAGNOSIS — Z9884 Bariatric surgery status: Secondary | ICD-10-CM | POA: Diagnosis not present

## 2018-05-21 DIAGNOSIS — I442 Atrioventricular block, complete: Secondary | ICD-10-CM | POA: Insufficient documentation

## 2018-05-21 DIAGNOSIS — I5042 Chronic combined systolic (congestive) and diastolic (congestive) heart failure: Secondary | ICD-10-CM | POA: Diagnosis not present

## 2018-05-21 DIAGNOSIS — K219 Gastro-esophageal reflux disease without esophagitis: Secondary | ICD-10-CM | POA: Insufficient documentation

## 2018-05-21 DIAGNOSIS — I428 Other cardiomyopathies: Secondary | ICD-10-CM | POA: Diagnosis not present

## 2018-05-21 DIAGNOSIS — I340 Nonrheumatic mitral (valve) insufficiency: Secondary | ICD-10-CM | POA: Diagnosis present

## 2018-05-21 DIAGNOSIS — F329 Major depressive disorder, single episode, unspecified: Secondary | ICD-10-CM | POA: Diagnosis not present

## 2018-05-21 DIAGNOSIS — Z79899 Other long term (current) drug therapy: Secondary | ICD-10-CM | POA: Diagnosis not present

## 2018-05-21 DIAGNOSIS — I4891 Unspecified atrial fibrillation: Secondary | ICD-10-CM | POA: Insufficient documentation

## 2018-05-21 DIAGNOSIS — Z7982 Long term (current) use of aspirin: Secondary | ICD-10-CM | POA: Diagnosis not present

## 2018-05-21 HISTORY — PX: TEE WITHOUT CARDIOVERSION: SHX5443

## 2018-05-21 LAB — BASIC METABOLIC PANEL
Anion gap: 8 (ref 5–15)
BUN: 17 mg/dL (ref 6–20)
CO2: 27 mmol/L (ref 22–32)
Calcium: 8.8 mg/dL — ABNORMAL LOW (ref 8.9–10.3)
Chloride: 103 mmol/L (ref 98–111)
Creatinine, Ser: 0.57 mg/dL (ref 0.44–1.00)
GFR calc Af Amer: >60 mL/min (ref >60)
GFR calc non Af Amer: >60 mL/min (ref >60)
Glucose, Bld: 87 mg/dL (ref 70–99)
Potassium: 4.4 mmol/L (ref 3.5–5.1)
Sodium: 138 mmol/L (ref 135–145)

## 2018-05-21 SURGERY — ECHOCARDIOGRAM, TRANSESOPHAGEAL
Anesthesia: Moderate Sedation

## 2018-05-21 MED ORDER — MIDAZOLAM HCL 5 MG/5ML IJ SOLN
INTRAMUSCULAR | Status: AC
  Filled 2018-05-21: qty 10

## 2018-05-21 MED ORDER — LIDOCAINE VISCOUS HCL 2 % MT SOLN
OROMUCOSAL | Status: AC
  Filled 2018-05-21: qty 15

## 2018-05-21 MED ORDER — SODIUM CHLORIDE 0.9 % IV SOLN
INTRAVENOUS | Status: DC
  Administered 2018-05-21: 08:00:00 via INTRAVENOUS

## 2018-05-21 MED ORDER — LIDOCAINE VISCOUS HCL 2 % MT SOLN
OROMUCOSAL | Status: DC | PRN
  Administered 2018-05-21: 10 mL via OROMUCOSAL

## 2018-05-21 MED ORDER — MIDAZOLAM HCL 5 MG/5ML IJ SOLN
INTRAMUSCULAR | Status: DC | PRN
  Administered 2018-05-21: 0.5 mg via INTRAVENOUS
  Administered 2018-05-21: 2 mg via INTRAVENOUS
  Administered 2018-05-21 (×3): 1 mg via INTRAVENOUS

## 2018-05-21 MED ORDER — SODIUM CHLORIDE BACTERIOSTATIC 0.9 % IJ SOLN
INTRAMUSCULAR | Status: AC
  Filled 2018-05-21: qty 10

## 2018-05-21 MED ORDER — FENTANYL CITRATE (PF) 100 MCG/2ML IJ SOLN
INTRAMUSCULAR | Status: AC
  Filled 2018-05-21: qty 4

## 2018-05-21 MED ORDER — SODIUM CHLORIDE BACTERIOSTATIC 0.9 % IJ SOLN
INTRAMUSCULAR | Status: DC | PRN
  Administered 2018-05-21: 9 mL

## 2018-05-21 MED ORDER — BUTAMBEN-TETRACAINE-BENZOCAINE 2-2-14 % EX AERO
INHALATION_SPRAY | CUTANEOUS | Status: AC
  Filled 2018-05-21: qty 5

## 2018-05-21 MED ORDER — FENTANYL CITRATE (PF) 100 MCG/2ML IJ SOLN
INTRAMUSCULAR | Status: DC | PRN
  Administered 2018-05-21: 25 ug via INTRAVENOUS
  Administered 2018-05-21: 50 ug via INTRAVENOUS
  Administered 2018-05-21: 25 ug via INTRAVENOUS

## 2018-05-21 NOTE — Discharge Instructions (Signed)
Upper Endoscopy, Adult, Care After °This sheet gives you information about how to care for yourself after your procedure. Your health care provider may also give you more specific instructions. If you have problems or questions, contact your health care provider. °What can I expect after the procedure? °After the procedure, it is common to have: °· A sore throat. °· Mild stomach pain or discomfort. °· Bloating. °· Nausea. °Follow these instructions at home: ° °· Follow instructions from your health care provider about what to eat or drink after your procedure. °· Return to your normal activities as told by your health care provider. Ask your health care provider what activities are safe for you. °· Take over-the-counter and prescription medicines only as told by your health care provider. °· Do not drive for 24 hours if you were given a sedative during your procedure. °· Keep all follow-up visits as told by your health care provider. This is important. °Contact a health care provider if you have: °· A sore throat that lasts longer than one day. °· Trouble swallowing. °Get help right away if: °· You vomit blood or your vomit looks like coffee grounds. °· You have: °? A fever. °? Bloody, black, or tarry stools. °? A severe sore throat or you cannot swallow. °? Difficulty breathing. °? Severe pain in your chest or abdomen. °Summary °· After the procedure, it is common to have a sore throat, mild stomach discomfort, bloating, and nausea. °· Do not drive for 24 hours if you were given a sedative during the procedure. °· Follow instructions from your health care provider about what to eat or drink after your procedure. °· Return to your normal activities as told by your health care provider. °This information is not intended to replace advice given to you by your health care provider. Make sure you discuss any questions you have with your health care provider. °Document Released: 09/26/2011 Document Revised: 08/27/2017  Document Reviewed: 08/27/2017 °Elsevier Interactive Patient Education © 2019 [employer] ° °

## 2018-05-21 NOTE — Progress Notes (Signed)
*  PRELIMINARY RESULTS* Echocardiogram Echocardiogram Transesophageal has been performed with Saline Bubble Study.  Stacey Drain 05/21/2018, 9:39 AM

## 2018-05-21 NOTE — CV Procedure (Signed)
Procedure: TEE Physician: Dr [name] Indication: Mitral regurgitation   The patient was brought to the proceudre suite after appropriate consent was obtained. The posterior orpharynx was anesthesized with 2% viscous lidocaine. She was layed in the left lateral decubtius position and bite block was placed. A total of 5.5mg  of versed and of fentanyl was used to achieve moderate sedation. The TEE probe was intubated into the esophagus without difficultly. Cardiopulmonary monitoring was performed throughout the procedure. She tolerated well without complications  Please see final TEE report. Prelim most consistent with moderate MR   [name] MD

## 2018-05-21 NOTE — H&P (Signed)
Patient presents for transesophageal echocardiogram for mitral regurgitation. Please refer to recent clinic note referenced below for full medical history.   [name] MD   Patient Care Team: Estanislado Pandy, MD as PCP - General (Cardiology) [name], MD (Cardiology) Hillis Range, MD (Cardiology)   HPI  [name] [name] is a 58 y.o. 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 She underwent generator replacement in April of 2008.  Marland Kitchen  Her dyspnea is much improved following up titration of her diuretics. She had a syncopal episode in December. She saw Dr. Lavella Lemons  he undertook an echocardiogram to assess her mitral valve  regurgitation was felt to be mild.  Ejection fraction was somewhat improved   compare with 2009 at 55%. There is been gradual incremental improvement ever since her AV ablation in the 1990s  Catheterization undertaken for chest pain  DATE TEST EF   10/15 Echo   55 % MR mod-severe  12/15 TEE  55 % MR 2 jets, mild-mod  4/18 LHC 35% CA normal   The patient has now retired fully.  She had intercurrent bronchitis which was associate with severe shortness of breath and nocturnal dyspnea and orthopnea.  Mostly she is back to her baseline functional status.  She is able to walk on the flats but not stairs.  No edema.      Past Medical History:  Diagnosis Date  . Anemia   . Atrial fibrillation (HCC)   . Chronic diastolic heart failure (HCC)    EF 40-45%  . Depression    history of depression in the past.  . GERD (gastroesophageal reflux disease)   . Mitral valvular regurgitation   . Pacemaker- medtronic    Initial PM 1995; generator 2009, infection with contralateral implant 2010  . S/P AV nodal ablation   . Shortness of breath dyspnea          Past Surgical History:  Procedure Laterality Date  . ABDOMINAL ADHESION SURGERY  04/2006  . CARDIAC  CATHETERIZATION  09/11/2007  . CARDIAC PACEMAKER PLACEMENT  1995   [facility],Medtronic Adapta ADDRL1  . CATARACT EXTRACTION, BILATERAL    . GASTRIC BYPASS  09/2003  . LEFT HEART CATH AND CORONARY ANGIOGRAPHY N/A 07/18/2016   Procedure: Left Heart Cath and Coronary Angiography;  Surgeon: Lennette Bihari, MD;  Location: [facility] INVASIVE CV LAB;  Service: Cardiovascular;  Laterality: N/A;  . TEE WITHOUT CARDIOVERSION N/A 03/19/2014   Procedure: TRANSESOPHAGEAL ECHOCARDIOGRAM (TEE);  Surgeon: Antoine Poche, MD;  Location: [facility];  Service: Endoscopy;  Laterality: N/A;          Current Outpatient Medications  Medication Sig Dispense Refill  . aspirin 81 MG tablet Take 1 tablet (81 mg total) by mouth daily. 30 tablet 3  . carvedilol (COREG) 6.25 MG tablet Take 6.25 mg by mouth 2 (two) times daily with a meal.    . cyanocobalamin (,VITAMIN B-12,) 1000 MCG/ML injection Inject 1,000 mcg into the muscle every 30 (thirty) days.     . enalapril (VASOTEC) 10 MG tablet Take 1 tablet (10 mg total) by mouth daily. 90 tablet 3  . furosemide (LASIX) 40 MG tablet TAKE ONE TABLET BY MOUTH TWICE DAILY 180 tablet 2  . Melatonin 10 MG TABS Take 1 tablet by mouth daily.    Marland Kitchen omeprazole (PRILOSEC) 20 MG capsule Take 20 mg by mouth daily.    . sertraline (  ZOLOFT) 25 MG tablet Take 150 mg by mouth daily.    . traZODone (DESYREL) 100 MG tablet Take 3 tablets by mouth daily.     No current facility-administered medications for this visit.     No Known Allergies  Review of Systems negative except from HPI and PMH  Physical Exam BP 112/86   Pulse 70   Ht 5\' 5"  (1.651 m)   Wt 204 lb 12.8 oz (92.9 kg)   SpO2 94%   BMI 34.08 kg/m  Well developed and nourished in no acute distress HENT normal Neck supple with JVP-flat Clear Regular rate and rhythm, no murmurs or gallops Abd-soft with active BS No Clubbing cyanosis edema Skin-warm and dry A & Oriented  Grossly  normal sensory and motor function    ECG atrial tachycardia with complete heart block and ventricular pacing Intervals-/15/47  Assessment and  Plan  Complete heart block status post AV junction ablation    Cardiomyopathy-nonischemic ejection fraction 50-55 >> 35  HFpEF//HFrEF chronic  .   Pacemaker  medtronic The patient's device was interrogated.  The information was reviewed. No changes were made in the programming.      Atrial tachy    Syncope  Mitral regurgitation-mild-moderate  Euvolemic continue current meds  The patient was noted to have worsening LV dysfunction and catheterization 2018.  Potential explanations could include mitral regurgitation valvular cardiomyopathy, pacemaker induced cardiomyopathy.  Reassessment of LV function by echo and mitral regurgitation are indicated.  She may need TEE again to clarify the extent of her mitral regurgitation.  Would also potentially consider CRT upgrade.  Encouraged exercise. More than 50% of 40 min was spent in counseling related to the above

## 2018-05-24 ENCOUNTER — Encounter (HOSPITAL_COMMUNITY): Payer: Self-pay | Admitting: Cardiology

## 2018-07-26 ENCOUNTER — Other Ambulatory Visit: Payer: Self-pay

## 2018-07-26 ENCOUNTER — Telehealth: Payer: Self-pay

## 2018-07-26 ENCOUNTER — Encounter: Payer: BLUE CROSS/BLUE SHIELD | Admitting: *Deleted

## 2018-07-26 NOTE — Telephone Encounter (Signed)
Left message for patient to remind of missed remote transmission.  

## 2018-12-24 ENCOUNTER — Ambulatory Visit (INDEPENDENT_AMBULATORY_CARE_PROVIDER_SITE_OTHER): Payer: BC Managed Care – PPO | Admitting: *Deleted

## 2018-12-24 DIAGNOSIS — I4891 Unspecified atrial fibrillation: Secondary | ICD-10-CM

## 2018-12-24 DIAGNOSIS — I442 Atrioventricular block, complete: Secondary | ICD-10-CM

## 2018-12-24 LAB — CUP PACEART REMOTE DEVICE CHECK
Battery Impedance: 1840 Ohm
Battery Remaining Longevity: 36 mo
Battery Voltage: 2.76 V
Brady Statistic AP VP Percent: 15 %
Brady Statistic AP VS Percent: 0 %
Brady Statistic AS VP Percent: 85 %
Brady Statistic AS VS Percent: 0 %
Date Time Interrogation Session: 20200915135136
Implantable Lead Implant Date: 20100201
Implantable Lead Implant Date: 20100201
Implantable Lead Location: 753859
Implantable Lead Location: 753860
Implantable Lead Model: 5076
Implantable Lead Model: 5076
Implantable Pulse Generator Implant Date: 20100201
Lead Channel Impedance Value: 436 Ohm
Lead Channel Impedance Value: 562 Ohm
Lead Channel Pacing Threshold Amplitude: 0.875 V
Lead Channel Pacing Threshold Pulse Width: 0.4 ms
Lead Channel Setting Pacing Amplitude: 2 V
Lead Channel Setting Pacing Amplitude: 2.5 V
Lead Channel Setting Pacing Pulse Width: 0.4 ms
Lead Channel Setting Sensing Sensitivity: 2 mV

## 2018-12-26 ENCOUNTER — Telehealth: Payer: Self-pay | Admitting: Internal Medicine

## 2018-12-26 NOTE — Telephone Encounter (Signed)
Returned call to patient. Reassured her that transmission received 12/24/18 shows normal PPM function (exported to Dr. Caryl Comes for review). Pt reports that ED told her episode was likely due to orthostatic hypotension. Prior to syncopal episode on 9/14, patient was doing yard work (leaf blowing, picking up sticks, watering plants). She had taken 80mg  of furosemide that morning as she had just returned from vacation and her BLEs were swollen. Reports similar episode 3-4 years ago.  Saw PCP 9/16, was advised to start back on carvedilol 9/16 and to start back on enalapril on 9/18, advised to avoid taking PRN furosemide unless she absolutely needs it. Recommended that pt keep log of her BPs over the next week to review with PCP. Advised that I will forward this information to Dr. Caryl Comes and Rolanda Lundborg, RN, for any additional recommendations. Pt in agreement with plan, no further questions at this time.

## 2018-12-26 NOTE — Telephone Encounter (Signed)
Patient called stating that she had a syncope episode on 12-23-2018 (states that she passed out in her front yard) Was seen in the ER at [facility].  Patient is concerned that she might need to have her pacemaker checked.  [phone])

## 2018-12-27 NOTE — Telephone Encounter (Signed)
[name] [name] Can we get her an appt with APP to look at orthostatic hypotension  Thanks SK

## 2018-12-30 NOTE — Telephone Encounter (Signed)
Patient is scheduled with Tommye Standard, PA, on 01/13/19.

## 2018-12-31 NOTE — Progress Notes (Signed)
Remote pacemaker transmission.   

## 2019-01-12 NOTE — Progress Notes (Signed)
Cardiology Office Note Date:  01/13/2019  Patient ID:  Heer, Justiss 12-09-1960, MRN [id number] PCP:  Manon Hilding, MD  Electrophysiologist:  Dr. Caryl Comes   Chief Complaint:  syncope  History of Present Illness: NEDRA MCINNIS is a 58 y.o. female with history of GERD, ATach, tachy mediated CM >> AV node ablation/PPM implant with subsequent improvement in her EF (waxes/wanes, most recently 45-50%), chronic CHF (combined), VHD w/mild-mod MR.    She comes in today to be seen for Dr. Caryl Comes.  Last seen by him in Jan 2020, at that visit mentioned a recent sever bronchitis resulting is slow pulmonary recovery.  He discussed h/o syncope in Dec (presumably 2019 ?) w/u with an echo noting mild - mod MR (previously described as mod-severe), LVEF 40-45% >> Jan 2020 TEE LVEF 45-50%, Mitral valve regurgitation 2 distinct MR jets. One jet is mild vena contracta is 0.3cm, one jet is mild to modearate vena contacta 0.4. Overall moderate MR.  She called the office recently suffering a syncopal episode 12/23/2018, treated at [facility], felt to have been orthostatic 2/2 taking PRN furosemide, and having been working out in the yard.. remote pacer transmission sent was charted as normal.  Dr. Caryl Comes asked she be brought in to evaluate orthostatics.    She reports she had been on vacation driving, sitting more then usual and noted her ankles swollen and had taken lasix 80mg  the morning of her syncopal event as well as her ususl medicines.  (she takes her lasix only PRN for swelling or SOB).  She did not feel SOB, and she says in hindsight, while traveling had not been drinking as much as usual and noted her urine was "darker then ususal".  She was out in the yard doing some work and began to feel "funny" she had a sense that her BS was low kind of feeling.  She began feeling weak and though she should go inside, the next thing she n=knew she was waking up to the parmaedics.  She was taken to the ER and there told she  was likely dehydrated.   She called [place] afterwards, sent a pacer remote found to be OK and asked to come in.  She says a week after her fainting episode in the yard she was seated in her recliner, got that same "funny feeling" again thugh not diabetic said made her feel like her sugar was low.  She had a sandwich in the fridge so she went to get it.  Feeling weak she decided to eat in bed and went to her bed to eat while in bed.  She says the next thing she knwe her husband was calling her name.  She says she was in bed, sitting up, eyes open with the sandwich in her hands up to her mouth arm/legs trembling/shaking and then after some moments came around.  She has had some palpitations that she is aware of at night, not new, none associated with either event, no CP or SOB.  No symptoms of PND or orthopnea.  She has seen her PMD who has referred her to a neurologist, pending her visit there  Orthostatics somewhat flat, no particular change in HR or BP with position change.   Device information: MDT dual chamber PPM, implanted 05/11/2008 AV node ablated  Past Medical History:  Diagnosis Date  . Anemia   . Atrial fibrillation ([place])   . Chronic diastolic heart failure (HCC)    EF 40-45%  . Depression  history of depression in the past.  . GERD (gastroesophageal reflux disease)   . Mitral valvular regurgitation   . Pacemaker- medtronic    Initial PM 1995; generator 2009, infection with contralateral implant 2010  . S/P AV nodal ablation   . Shortness of breath dyspnea     Past Surgical History:  Procedure Laterality Date  . ABDOMINAL ADHESION SURGERY  04/2006  . CARDIAC CATHETERIZATION  09/11/2007  . CARDIAC PACEMAKER PLACEMENT  1995   [facility],Medtronic Adapta ADDRL1  . CATARACT EXTRACTION, BILATERAL    . GASTRIC BYPASS  09/2003  . LEFT HEART CATH AND CORONARY ANGIOGRAPHY N/A 07/18/2016   Procedure: Left Heart Cath and Coronary Angiography;  Surgeon: Lennette Biharihomas A Kelly,  MD;  [facility];  Service: Cardiovascular;  Laterality: N/A;  . TEE WITHOUT CARDIOVERSION N/A 03/19/2014   Procedure: TRANSESOPHAGEAL ECHOCARDIOGRAM (TEE);  Surgeon: Antoine PocheJonathan F Branch, MD;  Location: [facility];  Service: Endoscopy;  Laterality: N/A;  . TEE WITHOUT CARDIOVERSION N/A 05/21/2018   Procedure: TRANSESOPHAGEAL ECHOCARDIOGRAM (TEE);  Surgeon: Antoine PocheBranch, Jonathan F, MD;  Location: [facility];  Service: Endoscopy;  Laterality: N/A;    Current Outpatient Medications  Medication Sig Dispense Refill  . aspirin EC 81 MG tablet Take 81 mg by mouth daily.    . carvedilol (COREG) 3.125 MG tablet Take 6.25 mg by mouth 2 (two) times daily.  6  . enalapril (VASOTEC) 10 MG tablet Take 1 tablet (10 mg total) by mouth daily. 90 tablet 3  . furosemide (LASIX) 40 MG tablet TAKE ONE TABLET BY MOUTH TWICE DAILY 180 tablet 2  . Melatonin 10 MG TABS Take 20 mg by mouth at bedtime.     Marland Kitchen. omeprazole (PRILOSEC) 20 MG capsule Take 20 mg by mouth daily before breakfast.     . sertraline (ZOLOFT) 100 MG tablet Take 150 mg by mouth daily.    . traZODone (DESYREL) 100 MG tablet Take 300 mg by mouth at bedtime.     Marland Kitchen. zolpidem (AMBIEN) 10 MG tablet Take 1 tablet by mouth at bedtime. As needed     No current facility-administered medications for this visit.     Allergies:   Patient has no known allergies.   Social History:  The patient  reports that she has never smoked. She has never used smokeless tobacco. She reports current alcohol use of about 7.0 standard drinks of alcohol per week. She reports that she does not use drugs.   Family History:  The patient's family history includes Arthritis in her sister; Arthritis-Osteo in her sister, sister, and sister; Atrial fibrillation in her sister; CAD in her brother, brother, and sister; Cancer in her sister; Cancer - Prostate in her father; Depression in her sister; Diabetes in her mother and sister; Fibromyalgia in her sister; Heart attack in  her brother and brother; Heart failure in her mother; Hypertension in her brother, brother, mother, sister, sister, and sister; Mitral valve prolapse in her sister; Osteoporosis in her sister; Pneumonia in her sister; Stroke in her mother.  ROS:  Please see the history of present illness.  All other systems are reviewed and otherwise negative.   PHYSICAL EXAM:  VS:  BP 124/68   Pulse 68   Ht 5\' 5"  (1.651 m)   Wt 204 lb 9.6 oz (92.8 kg)   SpO2 99%   BMI 34.05 kg/m  BMI: Body mass index is 34.05 kg/m. Well nourished, well developed, in no acute distress  HEENT: normocephalic,  atraumatic  Neck: no JVD, carotid bruits or masses Cardiac:  RRR; no significant murmurs, no rubs, or gallops Lungs:  CTA b/l, no wheezing, rhonchi or rales  Abd: soft, nontender, obese MS: no deformity or atrophy Ext: no edema  Skin: warm and dry, no rash Neuro:  No gross deficits appreciated Psych: euthymic mood, full affect  PPM site is stable, no tethering or discomfort   EKG:  Not done today PPM interrogation done today and reviewed by myself: battery and lead measurements are good She is programmed DDIR (chronically) and asynchronously V pacing today 4 AHR, EGMs reviewed are AT,  no HVR episodes She is pacer dependent by today's check at 40bpm   05/21/2018: TEE IMPRESSIONS  1. Left atrial size was mild-moderately dilated.  2. Mild prolapse of what appears to the A2 and P2 scallops creating two distinct MR jets. Mitral valve regurgitation 2 distinct MR jets. One jet is mild vena contracta is 0.3cm, one jet is mild to modearate vena contacta 0.4. Overall moderate MR.  3. The tricuspid valve was normal in structure. Tricuspid valve regurgitation is moderate.  4. The aortic valve is tricuspid.  5. Atrial septum is aneurysmal without clear shunting, negative bubble study.  6. The left ventricle has mildly reduced systolic function of 45-50%.  FINDINGS  Left Ventricle: The left ventricle has mildly  reduced systolic function of 45-50%. Left Atrium: Left atrial size was mild-moderately dilated. Normal LA appendage without thrombus, low normal emptying velocity 50 cm/s.   Interatrial Septum: Aneurysmal without clear shunting, negative bubble study. Agitated saline contrast was given intravenously to evaluate for intracardiac shunting. Saline contrast bubble study was negative, with no evidence of any interatrial shunt. Mitral Valve: Mild prolapse of what appears to the A2 and P2 scallops creating two distinct MR jets. Mitral valve regurgitation 2 distinct MR jets. One jet is mild vena contracta is 0.3cm, one jet is mild to modearate vena contacta 0.4. Overall moderate  MR. Tricuspid Valve: The tricuspid valve was normal in structure. Tricuspid valve regurgitation is moderate by color flow Doppler. Aortic Valve: The aortic valve is tricuspid Aortic valve regurgitation was not visualized by color flow Doppler. Pulmonic Valve: The pulmonic valve was not well visualized. The pulmonic valve was grossly normal. Pulmonic valve regurgitation is not visualized by color flow Doppler. No evidence of pulmonic stenosis.   RIGHT ATRIUM RA Pressure: 3 mmHg  MR Peak grad: 99.2 mmHg MR Mean grad: 54.0 mmHg MR Vmax:      498.00 cm/s MR Vmean:     331.0 cm/s    [name] MD Electronically signed by [name] MD Signature Date/Time: 05/21/2018/12:02:04 PM Final        05/08/2018: TTE Study Conclusions - Left ventricle: The cavity size was mildly to moderately dilated.   Wall thickness was normal. Systolic function was mildly to   moderately reduced. The estimated ejection fraction was in the   range of 40% to 45%. Diffuse hypokinesis. Septal motion   consistent with RV pacing. Left ventricular diastolic function   parameters were normal. - Mitral valve: Mildly calcified annulus. Mildly thickened   leaflets. There was moderate regurgitation directed posteriorly.   PISA calculations  suggest mild regurgitation, however color   Doppler is most consistent with moderate regurgitation. - Left atrium: The atrium was moderately dilated. - Right ventricle: Pacer wire or catheter noted in right ventricle. - Right atrium: Central venous pressure (est): 3 mm Hg. - Atrial septum: No defect or patent foramen ovale was  identified. - Tricuspid valve: There was mild-moderate regurgitation. - Pulmonary arteries: PA peak pressure: 43 mm Hg (S). - Pericardium, extracardiac: There was no pericardial effusion.    07/18/16: LHC  The left ventricular ejection fraction is 35-45% by visual estimate. Moderate global LV dysfunction infection fraction of 35-40%.  During the ventriculogram, the patient went into a brief run of SVT/atrial tachycardia. Essentially normal coronary arteries with very mild systolic kinking on the bend in the mid LAD without significant obstruction.    2015 TEE, LVEF 50-55%, There appears to be two distinct regurgitant jets.  Both are mild by vena contracta measurement (0.2 and 0.3 cm).  Composite of regurgitation is in the mild to moderate range 2015: LVEF 50-55%, mod-severe MR 2014: LVEF 40-45%, mild-mod MR    Recent Labs: 05/21/2018: BUN 17; Creatinine, Ser 0.57; Potassium 4.4; Sodium 138  No results found for requested labs within last 8760 hours.   CrCl cannot be calculated (Patient's most recent lab result is older than the maximum 21 days allowed.).   Wt Readings from Last 3 Encounters:  01/13/19 204 lb 9.6 oz (92.8 kg)  05/21/18 198 lb (89.8 kg)  04/26/18 204 lb 12.8 oz (92.9 kg)     Other studies reviewed: Additional studies/records reviewed today include: summarized above  ASSESSMENT AND PLAN:  1. PPM 2. Atach >> AV node ablation     Functioning as programmed     Pacer depenent     I have reviewed a number of prior in-clinic device checks, all appear the same.  The patient remarks that Dr. Graciela Husbands has had her walkin, do laps in th  eoffice on a number of occassions and has tweaked her programming to it's current and has done well with it  2. Chronic CHF, mixed     No exam findings to suggest fluid OL currently     She is using her lasix PRN for edema or SOB     On BB/ACE tx  3. Syncope     Does not sound of HR or pacer.  pacer function is intact and chronically programmed DDIR     She was seen with her initial event at [facility] ER and suspect to have been dehydrate     She is pending neurology evaluation via her PMD for what sound perhaps of more seizure like second event  We discuss importance of balance of adequate hydration and lasic use Neuro eval in pending No changes at this time from [place]    Disposition: F/u with Dr. Graciela Husbands as scheduled, sooner if needed, home pacer remotes Q 3 months.  Current medicines are reviewed at length with the patient today.  The patient did not have any concerns regarding medicines.  Norma Fredrickson, PA-C 01/13/2019 5:24 PM     [facility] [address] [place] [address] [phone] (office)  [phone] (fax)

## 2019-01-13 ENCOUNTER — Other Ambulatory Visit: Payer: Self-pay

## 2019-01-13 ENCOUNTER — Ambulatory Visit (INDEPENDENT_AMBULATORY_CARE_PROVIDER_SITE_OTHER): Payer: BC Managed Care – PPO | Admitting: Physician Assistant

## 2019-01-13 ENCOUNTER — Encounter: Payer: Self-pay | Admitting: Physician Assistant

## 2019-01-13 VITALS — BP 124/68 | HR 68 | Ht 65.0 in | Wt 204.6 lb

## 2019-01-13 DIAGNOSIS — I471 Supraventricular tachycardia: Secondary | ICD-10-CM | POA: Diagnosis not present

## 2019-01-13 DIAGNOSIS — I5042 Chronic combined systolic (congestive) and diastolic (congestive) heart failure: Secondary | ICD-10-CM

## 2019-01-13 DIAGNOSIS — Z950 Presence of cardiac pacemaker: Secondary | ICD-10-CM | POA: Diagnosis not present

## 2019-01-13 DIAGNOSIS — R55 Syncope and collapse: Secondary | ICD-10-CM

## 2019-01-13 NOTE — Patient Instructions (Addendum)
Medication Instructions:   Your physician recommends that you continue on your current medications as directed. Please refer to the Current Medication list given to you today.  If you need a refill on your cardiac medications before your next appointment, please call your pharmacy.   Lab work:  None ordered  Testing/Procedures:  None ordered today  Follow-Up:  As scheduled with Dr. Virl Axe on 04/29/19 at [facility]  Any Other Special Instructions Will Be Listed Below (If Applicable).  Please make sure you are staying adequately hydrated every day.

## 2019-02-10 ENCOUNTER — Ambulatory Visit: Payer: BC Managed Care – PPO | Admitting: Neurology

## 2019-03-26 ENCOUNTER — Telehealth: Payer: Self-pay

## 2019-03-26 NOTE — Telephone Encounter (Signed)
Left message for patient to remind of missed remote transmission.  

## 2019-04-09 ENCOUNTER — Ambulatory Visit (INDEPENDENT_AMBULATORY_CARE_PROVIDER_SITE_OTHER): Payer: BC Managed Care – PPO | Admitting: *Deleted

## 2019-04-09 DIAGNOSIS — Z950 Presence of cardiac pacemaker: Secondary | ICD-10-CM

## 2019-04-09 DIAGNOSIS — I442 Atrioventricular block, complete: Secondary | ICD-10-CM

## 2019-04-09 LAB — CUP PACEART REMOTE DEVICE CHECK
Battery Impedance: 1956 Ohm
Battery Remaining Longevity: 33 mo
Battery Voltage: 2.75 V
Brady Statistic AP VP Percent: 11 %
Brady Statistic AP VS Percent: 0 %
Brady Statistic AS VP Percent: 89 %
Brady Statistic AS VS Percent: 0 %
Date Time Interrogation Session: 20201230104859
Implantable Lead Implant Date: 20100201
Implantable Lead Implant Date: 20100201
Implantable Lead Location: 753859
Implantable Lead Location: 753860
Implantable Lead Model: 5076
Implantable Lead Model: 5076
Implantable Pulse Generator Implant Date: 20100201
Lead Channel Impedance Value: 428 Ohm
Lead Channel Impedance Value: 526 Ohm
Lead Channel Pacing Threshold Amplitude: 1 V
Lead Channel Pacing Threshold Pulse Width: 0.4 ms
Lead Channel Setting Pacing Amplitude: 2 V
Lead Channel Setting Pacing Amplitude: 2.5 V
Lead Channel Setting Pacing Pulse Width: 0.4 ms
Lead Channel Setting Sensing Sensitivity: 2 mV

## 2019-04-09 NOTE — Progress Notes (Signed)
PPM Remote  

## 2019-04-26 DIAGNOSIS — I471 Supraventricular tachycardia: Secondary | ICD-10-CM | POA: Insufficient documentation

## 2019-04-26 DIAGNOSIS — I428 Other cardiomyopathies: Secondary | ICD-10-CM | POA: Insufficient documentation

## 2019-04-26 DIAGNOSIS — I4719 Other supraventricular tachycardia: Secondary | ICD-10-CM | POA: Insufficient documentation

## 2019-04-29 ENCOUNTER — Encounter: Payer: BLUE CROSS/BLUE SHIELD | Admitting: Internal Medicine

## 2019-07-09 ENCOUNTER — Ambulatory Visit (INDEPENDENT_AMBULATORY_CARE_PROVIDER_SITE_OTHER): Payer: BC Managed Care – PPO | Admitting: *Deleted

## 2019-07-09 DIAGNOSIS — I442 Atrioventricular block, complete: Secondary | ICD-10-CM

## 2019-07-09 LAB — CUP PACEART REMOTE DEVICE CHECK
Battery Impedance: 2111 Ohm
Battery Remaining Longevity: 31 mo
Battery Voltage: 2.75 V
Brady Statistic AP VP Percent: 11 %
Brady Statistic AP VS Percent: 0 %
Brady Statistic AS VP Percent: 89 %
Brady Statistic AS VS Percent: 0 %
Date Time Interrogation Session: 20210331140813
Implantable Lead Implant Date: 20100201
Implantable Lead Implant Date: 20100201
Implantable Lead Location: 753859
Implantable Lead Location: 753860
Implantable Lead Model: 5076
Implantable Lead Model: 5076
Implantable Pulse Generator Implant Date: 20100201
Lead Channel Impedance Value: 438 Ohm
Lead Channel Impedance Value: 577 Ohm
Lead Channel Pacing Threshold Amplitude: 0.875 V
Lead Channel Pacing Threshold Pulse Width: 0.4 ms
Lead Channel Setting Pacing Amplitude: 2 V
Lead Channel Setting Pacing Amplitude: 2.5 V
Lead Channel Setting Pacing Pulse Width: 0.4 ms
Lead Channel Setting Sensing Sensitivity: 2 mV

## 2019-07-09 NOTE — Progress Notes (Signed)
PPM Remote  

## 2019-10-08 ENCOUNTER — Ambulatory Visit (INDEPENDENT_AMBULATORY_CARE_PROVIDER_SITE_OTHER): Payer: Self-pay | Admitting: *Deleted

## 2019-10-08 DIAGNOSIS — I442 Atrioventricular block, complete: Secondary | ICD-10-CM

## 2019-10-10 LAB — CUP PACEART REMOTE DEVICE CHECK
Battery Impedance: 2120 Ohm
Battery Remaining Longevity: 31 mo
Battery Voltage: 2.75 V
Brady Statistic AP VP Percent: 11 %
Brady Statistic AP VS Percent: 0 %
Brady Statistic AS VP Percent: 88 %
Brady Statistic AS VS Percent: 0 %
Date Time Interrogation Session: 20210701184856
Implantable Lead Implant Date: 20100201
Implantable Lead Implant Date: 20100201
Implantable Lead Location: 753859
Implantable Lead Location: 753860
Implantable Lead Model: 5076
Implantable Lead Model: 5076
Implantable Pulse Generator Implant Date: 20100201
Lead Channel Impedance Value: 444 Ohm
Lead Channel Impedance Value: 495 Ohm
Lead Channel Pacing Threshold Amplitude: 0.875 V
Lead Channel Pacing Threshold Pulse Width: 0.4 ms
Lead Channel Setting Pacing Amplitude: 2 V
Lead Channel Setting Pacing Amplitude: 2.5 V
Lead Channel Setting Pacing Pulse Width: 0.4 ms
Lead Channel Setting Sensing Sensitivity: 2 mV

## 2019-10-10 NOTE — Progress Notes (Signed)
Remote pacemaker transmission.   

## 2019-11-27 ENCOUNTER — Telehealth: Payer: Self-pay

## 2019-11-27 ENCOUNTER — Other Ambulatory Visit: Payer: Self-pay

## 2019-11-27 ENCOUNTER — Telehealth (INDEPENDENT_AMBULATORY_CARE_PROVIDER_SITE_OTHER): Payer: 59 | Admitting: Internal Medicine

## 2019-11-27 VITALS — BP 108/76 | HR 80 | Ht 65.0 in | Wt 233.0 lb

## 2019-11-27 DIAGNOSIS — I442 Atrioventricular block, complete: Secondary | ICD-10-CM | POA: Diagnosis not present

## 2019-11-27 DIAGNOSIS — I428 Other cardiomyopathies: Secondary | ICD-10-CM | POA: Diagnosis not present

## 2019-11-27 DIAGNOSIS — I471 Supraventricular tachycardia: Secondary | ICD-10-CM | POA: Diagnosis not present

## 2019-11-27 DIAGNOSIS — Z79899 Other long term (current) drug therapy: Secondary | ICD-10-CM

## 2019-11-27 DIAGNOSIS — Z950 Presence of cardiac pacemaker: Secondary | ICD-10-CM | POA: Diagnosis not present

## 2019-11-27 NOTE — Telephone Encounter (Signed)
  Patient Consent for Virtual Visit         [name] [name] has provided verbal consent on 11/27/2019 for a virtual visit (video or telephone).   CONSENT FOR VIRTUAL VISIT FOR:  [name] [name]  By participating in this virtual visit I agree to the following:  I hereby voluntarily request, consent and authorize [facility] and its employed or contracted physicians, Producer, television/film/video, nurse practitioners or other licensed health care professionals (the Practitioner), to provide me with telemedicine health care services (the "Services") as deemed necessary by the treating Practitioner. I acknowledge and consent to receive the Services by the Practitioner via telemedicine. I understand that the telemedicine visit will involve communicating with the Practitioner through live audiovisual communication technology and the disclosure of certain medical information by electronic transmission. I acknowledge that I have been given the opportunity to request an in-person assessment or other available alternative prior to the telemedicine visit and am voluntarily participating in the telemedicine visit.  I understand that I have the right to withhold or withdraw my consent to the use of telemedicine in the course of my care at any time, without affecting my right to future care or treatment, and that the Practitioner or I may terminate the telemedicine visit at any time. I understand that I have the right to inspect all information obtained and/or recorded in the course of the telemedicine visit and may receive copies of available information for a reasonable fee.  I understand that some of the potential risks of receiving the Services via telemedicine include:  Marland Kitchen Delay or interruption in medical evaluation due to technological equipment failure or disruption; . Information transmitted may not be sufficient (e.g. poor resolution of images) to allow for appropriate medical decision making by the  Practitioner; and/or  . In rare instances, security protocols could fail, causing a breach of personal health information.  Furthermore, I acknowledge that it is my responsibility to provide information about my medical history, conditions and care that is complete and accurate to the best of my ability. I acknowledge that Practitioner's advice, recommendations, and/or decision may be based on factors not within their control, such as incomplete or inaccurate data provided by me or distortions of diagnostic images or specimens that may result from electronic transmissions. I understand that the practice of medicine is not an exact science and that Practitioner makes no warranties or guarantees regarding treatment outcomes. I acknowledge that a copy of this consent can be made available to me via my patient portal [facility] MyChart), or I can request a printed copy by calling the office of [facility].    I understand that my insurance will be billed for this visit.   I have read or had this consent read to me. . I understand the contents of this consent, which adequately explains the benefits and risks of the Services being provided via telemedicine.  . I have been provided ample opportunity to ask questions regarding this consent and the Services and have had my questions answered to my satisfaction. . I give my informed consent for the services to be provided through the use of telemedicine in my medical care

## 2019-11-27 NOTE — Progress Notes (Signed)
Electrophysiology TeleHealth Note   Due to national recommendations of social distancing due to COVID 19, an audio/video telehealth visit is felt to be most appropriate for this patient at this time.  See MyChart message from today for the patient's consent to telehealth for [facility].   Date:  11/27/2019   ID:  [name] [name], DOB 27-Jul-1960, MRN [id number]  Location: patient's home  Provider location: [address], [place]  Evaluation Performed: Follow-up visit  PCP:  Estanislado Pandy, MD  Cardiologist:     Electrophysiologist:  SK   Chief Complaint:  Complete heart block and cardiomyopathy  History of Present Illness:    [name] [name] is a 59 y.o. female who presents via audio/video conferencing for a telehealth visit today.  Since last being seen in our clinic for  followup with history of tachycardia induced cardiomyopathy status post AV ablation and DDD pacing in 1995. She underwent generator replacement 2010  Interval deterioration of LV function See Below --  .     Syncope 9/20 seen at [facility]  DATE TEST EF   10/15 Echo   55 % MR mod-severe  12/15 TEE  55 % MR 2 jets, mild-mod  4/18 LHC 35-45% CA normal  1/20 Echo  40-45% MR md  2/20 TEE  MVP mild/MR mod    the patient reports With interval weight gain more dyspnea,  Some edema  No chest pain  Previously disinclined to consider CRT upgrade as she was doing pretty well  SOB at 150 ft and even with [place] from the post office   The patient denies symptoms of fevers, chills, cough, or new SOB worrisome for COVID 19.    Past Medical History:  Diagnosis Date  . Anemia   . Atrial fibrillation (HCC)   . Chronic diastolic heart failure (HCC)    EF 40-45%  . Depression    history of depression in the past.  . GERD (gastroesophageal reflux disease)   . Mitral valvular regurgitation   . Pacemaker- medtronic    Initial PM 1995; generator 2009, infection with contralateral  implant 2010  . S/P AV nodal ablation   . Shortness of breath dyspnea     Past Surgical History:  Procedure Laterality Date  . ABDOMINAL ADHESION SURGERY  04/2006  . CARDIAC CATHETERIZATION  09/11/2007  . CARDIAC PACEMAKER PLACEMENT  1995   [facility],Medtronic Adapta ADDRL1  . CATARACT EXTRACTION, BILATERAL    . GASTRIC BYPASS  09/2003  . LEFT HEART CATH AND CORONARY ANGIOGRAPHY N/A 07/18/2016   Procedure: Left Heart Cath and Coronary Angiography;  Surgeon: Lennette Bihari, MD;  Location: [facility] INVASIVE CV LAB;  Service: Cardiovascular;  Laterality: N/A;  . TEE WITHOUT CARDIOVERSION N/A 03/19/2014   Procedure: TRANSESOPHAGEAL ECHOCARDIOGRAM (TEE);  Surgeon: Antoine Poche, MD;  [facility];  Service: Endoscopy;  Laterality: N/A;  . TEE WITHOUT CARDIOVERSION N/A 05/21/2018   Procedure: TRANSESOPHAGEAL ECHOCARDIOGRAM (TEE);  Surgeon: Antoine Poche, MD;  [facility];  Service: Endoscopy;  Laterality: N/A;    Current Outpatient Medications  Medication Sig Dispense Refill  . ARIPiprazole (ABILIFY) 10 MG tablet Take 10 mg by mouth daily.    Marland Kitchen aspirin EC 81 MG tablet Take 81 mg by mouth daily.    . carvedilol (COREG) 3.125 MG tablet Take 6.25 mg by mouth 2 (two) times daily.  6  . DULoxetine (CYMBALTA) 20 MG capsule Take 20 mg  by mouth 2 (two) times daily.    . enalapril (VASOTEC) 10 MG tablet Take 1 tablet (10 mg total) by mouth daily. 90 tablet 3  . furosemide (LASIX) 40 MG tablet TAKE ONE TABLET BY MOUTH TWICE DAILY 180 tablet 2  . Melatonin 10 MG TABS Take 20 mg by mouth at bedtime.     Marland Kitchen omeprazole (PRILOSEC) 20 MG capsule Take 20 mg by mouth daily before breakfast.     . traZODone (DESYREL) 100 MG tablet Take 300 mg by mouth at bedtime.     . sertraline (ZOLOFT) 100 MG tablet Take 150 mg by mouth daily. (Patient not taking: Reported on 11/27/2019)    . zolpidem (AMBIEN) 10 MG tablet Take 1 tablet by mouth at bedtime. As needed (Patient not  taking: Reported on 11/27/2019)     No current facility-administered medications for this visit.    Allergies:   Patient has no known allergies.   Social History:  The patient  reports that she has never smoked. She has never used smokeless tobacco. She reports current alcohol use of about 7.0 standard drinks of alcohol per week. She reports that she does not use drugs.   Family History:  The patient's   family history includes Arthritis in her sister; Arthritis-Osteo in her sister, sister, and sister; Atrial fibrillation in her sister; CAD in her brother, brother, and sister; Cancer in her sister; Cancer - Prostate in her father; Depression in her sister; Diabetes in her mother and sister; Fibromyalgia in her sister; Heart attack in her brother and brother; Heart failure in her mother; Hypertension in her brother, brother, mother, sister, sister, and sister; Mitral valve prolapse in her sister; Osteoporosis in her sister; Pneumonia in her sister; Stroke in her mother.   ROS:  Please see the history of present illness.   All other systems are personally reviewed and negative.    Exam:    Vital Signs:  BP 108/76   Pulse 80   Ht 5\' 5"  (1.651 m)   Wt 233 lb (105.7 kg)   BMI 38.77 kg/m        Labs/Other Tests and Data Reviewed:    Recent Labs: No results found for requested labs within last 8760 hours.   Wt Readings from Last 3 Encounters:  11/27/19 233 lb (105.7 kg)  01/13/19 204 lb 9.6 oz (92.8 kg)  05/21/18 198 lb (89.8 kg)     Other studies personally reviewed: Additional studies/ records that were reviewed today include:   Last device remote is reviewed from PaceART PDF  which reveals normal device function,   arrhythmias - none     ASSESSMENT & PLAN:   Complete heart block status post AV junction ablation    Cardiomyopathy-nonischemic ejection fraction 50-55 >>35  HFpEF//HFrEF acute chronic class 3 .   Pacemaker  medtronic     Atrial tachy     Syncope  Mitral regurgitation- moderate central   With cardiomyopathy will begin low dose aldactone, choosing that over entresto given the greater incremental benefit.  Will need BMET in 2 weeks  Also with LV dysfunction and RV apical pacing and mod MR with MVP w worsening heart failure symptoms Options include addressing the valve, but wonder whether CRT upgrade might not also help with MV function and a better first step.  Will reach out to colleagues  Her atrial rate now hovers about 100-110; considerably slower than what it was.  This could have multiple explanations, including longer circuits, reversion to  sinus; while I suspect the former, it may not matter as we may be able to reestablish AV synchrony and improve functional status.  She has put on weight apparently and that confuses things    COVID 19 screen The patient denies symptoms of COVID 19 at this time.  The importance of social distancing was discussed today.  Follow-up: OV with DC or me (sooner (double book ) to reprogram her device  Next remote As Scheduled   Current medicines are reviewed at length with the patient today.   The patient does not have concerns regarding her medicines.  The following changes were made today:  Begin aldactone  Discussed side effects   Labs/ tests ordered today include: bmet in 2 weeks  No orders of the defined types were placed in this encounter.   Future tests ( post COVID )   s  Patient Risk:  after full review of this patients clinical status, I feel that they are at moderate risk at this time.  Today, I have spent 22 minutes with the patient with telehealth technology discussing the above.  Signed, Sherryl Manges, MD  11/27/2019 4:56 PM     [facility] [address] [place] [address] [phone] (office) [phone] (fax)

## 2019-11-28 NOTE — Addendum Note (Signed)
Addended by: Alois Cliche on: 11/28/2019 05:45 PM   Modules accepted: Orders

## 2019-11-28 NOTE — Patient Instructions (Addendum)
Medication Instructions:  Your physician has recommended you make the following change in your medication:   ** Begin Aldactone 25mg  - Take 1/2 tablet (12.5mg ) daily by mouth.  *If you need a refill on your cardiac medications before your next appointment, please call your pharmacy*   Lab Work: ** BMET in 2 weeks  If you have labs (blood work) drawn today and your tests are completely normal, you will receive your results only by: MyChart Message (if you have MyChart) OR . A paper copy in the mail If you have any lab test that is abnormal or we need to change your treatment, we will call you to review the results.   Testing/Procedures: None ordered.    Follow-Up: At [facility], you and your health needs are our priority.  As part of our continuing mission to provide you with exceptional heart care, we have created designated Provider Care Teams.  These Care Teams include your primary Cardiologist (physician) and Advanced Practice Providers (APPs -  Physician Assistants and Nurse Practitioners) who all work together to provide you with the care you need, when you need it.  We recommend signing up for the patient portal called "MyChart".  Sign up information is provided on this After Visit Summary.  MyChart is used to connect with patients for Virtual Visits (Telemedicine).  Patients are able to view lab/test results, encounter notes, upcoming appointments, etc.  Non-urgent messages can be sent to your provider as well.   To learn more about what you can do with MyChart, go to [facility].    Your next appointment:      Other Instructions Phone call to pt.  Left voicemail message to contact RN at [phone]. 12/02/2019 - Left message for pt to call RN at [phone].

## 2019-11-30 MED ORDER — SPIRONOLACTONE 25 MG PO TABS: 12.5000 mg | ORAL_TABLET | Freq: Every day | ORAL | 3 refills | Status: DC

## 2019-11-30 NOTE — Addendum Note (Signed)
Addended by: Alois Cliche on: 11/30/2019 09:29 PM   Modules accepted: Orders

## 2019-12-02 ENCOUNTER — Telehealth: Payer: Self-pay

## 2019-12-02 DIAGNOSIS — I5032 Chronic diastolic (congestive) heart failure: Secondary | ICD-10-CM

## 2019-12-02 DIAGNOSIS — I428 Other cardiomyopathies: Secondary | ICD-10-CM

## 2019-12-02 DIAGNOSIS — Z79899 Other long term (current) drug therapy: Secondary | ICD-10-CM

## 2019-12-02 NOTE — Telephone Encounter (Signed)
Follow up   Pt is returning call from [facility], pt said she can also leave her a message to her mychart if that's easier

## 2019-12-02 NOTE — Telephone Encounter (Signed)
Left voicemail message for pt to contact RN at [phone] re:11/27/2019  AVS instructions.

## 2019-12-02 NOTE — Telephone Encounter (Signed)
Spoke with pt and reviewed AVS instructions from pt's 11/27/2019 visit with Dr Graciela Husbands.  Order placed for BMET in 2 weeks.  Pt took her first dose of Aldactone today.  Appointment scheduled with device clinic to adjust PPM 12/18/2019 at 330pm.  Pt verbalizes understanding and agrees with current plan.

## 2019-12-18 ENCOUNTER — Ambulatory Visit (INDEPENDENT_AMBULATORY_CARE_PROVIDER_SITE_OTHER): Payer: 59 | Admitting: Emergency Medicine

## 2019-12-18 ENCOUNTER — Other Ambulatory Visit: Payer: Self-pay

## 2019-12-18 DIAGNOSIS — I442 Atrioventricular block, complete: Secondary | ICD-10-CM | POA: Diagnosis not present

## 2019-12-18 DIAGNOSIS — Z950 Presence of cardiac pacemaker: Secondary | ICD-10-CM | POA: Diagnosis not present

## 2019-12-18 LAB — CUP PACEART INCLINIC DEVICE CHECK
Battery Impedance: 2127 Ohm
Battery Remaining Longevity: 31 mo
Battery Voltage: 2.74 V
Brady Statistic AP VP Percent: 11 %
Brady Statistic AP VS Percent: 0 %
Brady Statistic AS VP Percent: 88 %
Brady Statistic AS VS Percent: 0 %
Date Time Interrogation Session: 20210909163500
Implantable Lead Implant Date: 20100201
Implantable Lead Implant Date: 20100201
Implantable Lead Location: 753859
Implantable Lead Location: 753860
Implantable Lead Model: 5076
Implantable Lead Model: 5076
Implantable Pulse Generator Implant Date: 20100201
Lead Channel Impedance Value: 450 Ohm
Lead Channel Impedance Value: 506 Ohm
Lead Channel Pacing Threshold Amplitude: 0.75 V
Lead Channel Pacing Threshold Amplitude: 1.25 V
Lead Channel Pacing Threshold Pulse Width: 0.4 ms
Lead Channel Pacing Threshold Pulse Width: 0.4 ms
Lead Channel Sensing Intrinsic Amplitude: 0.7 mV
Lead Channel Setting Pacing Amplitude: 2 V
Lead Channel Setting Pacing Amplitude: 2.5 V
Lead Channel Setting Pacing Pulse Width: 0.4 ms
Lead Channel Setting Sensing Sensitivity: 2 mV

## 2019-12-18 NOTE — Progress Notes (Signed)
Pacemaker check in clinic for reprogramming per order from Dr. Graciela Husbands. Normal device function. Thresholds, sensing, impedances consistent with previous measurements. Device programmed to maximize longevity. 62 AHRs (<0.1%), all available appear AT, longest 4.5 min. 2 NSVT, longest 8 beats. Device programmed at appropriate safety margins. Histogram distribution appropriate for patient activity level. Changes per Dr. Graciela Husbands: mode reprogrammed to DDDR, upper sensor/upper track at 120bpm. Estimated longevity 31 months. Patient enrolled in remote follow-up. Patient education completed. Carelink on 01/07/20 and ROV with Dr. Graciela Husbands via virtual visit on 01/08/20 to discuss CRT upgrade.

## 2020-01-07 ENCOUNTER — Encounter: Payer: Self-pay | Admitting: Internal Medicine

## 2020-01-07 NOTE — Telephone Encounter (Signed)
Error

## 2020-01-08 ENCOUNTER — Other Ambulatory Visit: Payer: Self-pay

## 2020-01-08 ENCOUNTER — Telehealth (INDEPENDENT_AMBULATORY_CARE_PROVIDER_SITE_OTHER): Payer: 59 | Admitting: Internal Medicine

## 2020-01-08 VITALS — BP 121/67 | HR 66 | Ht 65.0 in

## 2020-01-08 DIAGNOSIS — I5032 Chronic diastolic (congestive) heart failure: Secondary | ICD-10-CM | POA: Diagnosis not present

## 2020-01-08 DIAGNOSIS — I428 Other cardiomyopathies: Secondary | ICD-10-CM

## 2020-01-08 DIAGNOSIS — Z950 Presence of cardiac pacemaker: Secondary | ICD-10-CM

## 2020-01-08 DIAGNOSIS — I471 Supraventricular tachycardia: Secondary | ICD-10-CM

## 2020-01-08 NOTE — Progress Notes (Signed)
Electrophysiology TeleHealth Note   Due to national recommendations of social distancing due to COVID 19, an audio/video telehealth visit is felt to be most appropriate for this patient at this time.  See MyChart message from today for the patient's consent to telehealth for [facility].   Date:  01/08/2020   ID:  [name] [name], DOB 1961/03/10, MRN [id number]  Location: patient's home  Provider location: [address], [place]  Evaluation Performed: Follow-up visit  PCP:  Estanislado Pandy, MD  Cardiologist:     Electrophysiologist:  SK   Chief Complaint:  Complete heart block and cardiomyopathy  History of Present Illness:    [name] [name] is a 59 y.o. female who presents via audio/video conferencing for a telehealth visit today.  Since last being seen in our clinic for  followup with history of tachycardia induced cardiomyopathy status post AV ablation and DDD pacing in 1995. She underwent generator replacement 2010  Interval deterioration of LV function See Below --reminds me of hospitalization at [facility] med last 10/20 for acute CHF with flash pulm edema  Date Cr K Hgb  10/20 0.69 3.6 13.5      Syncope 9/20 seen at [facility]  DATE TEST EF   10/15 Echo   55 % MR mod-severe  12/15 TEE  55 % MR 2 jets, mild-mod  4/18 LHC 35-45% CA normal  1/20 Echo  40-45% MR md  2/20 TEE 45-50 MVP mild/MR mod  10/20 Echo (CE)  40% MR mild-mod    the patient reports With interval weight gain more dyspnea,  Some edema  No chest pain  Previously disinclined to consider CRT upgrade as she was doing pretty well  SOB at 150 ft and [place]   The patient denies symptoms of fevers, chills, cough, or new SOB worrisome for COVID 19.    Past Medical History:  Diagnosis Date  . Anemia   . Atrial fibrillation (HCC)   . Chronic diastolic heart failure (HCC)    EF 40-45%  . Depression    history of depression in the past.  . GERD  (gastroesophageal reflux disease)   . Mitral valvular regurgitation   . Pacemaker- medtronic    Initial PM 1995; generator 2009, infection with contralateral implant 2010  . S/P AV nodal ablation   . Shortness of breath dyspnea     Past Surgical History:  Procedure Laterality Date  . ABDOMINAL ADHESION SURGERY  04/2006  . CARDIAC CATHETERIZATION  09/11/2007  . CARDIAC PACEMAKER PLACEMENT  1995   [facility],Medtronic Adapta ADDRL1  . CATARACT EXTRACTION, BILATERAL    . GASTRIC BYPASS  09/2003  . LEFT HEART CATH AND CORONARY ANGIOGRAPHY N/A 07/18/2016   Procedure: Left Heart Cath and Coronary Angiography;  Surgeon: Lennette Bihari, MD;  Location: [facility] INVASIVE CV LAB;  Service: Cardiovascular;  Laterality: N/A;  . TEE WITHOUT CARDIOVERSION N/A 03/19/2014   Procedure: TRANSESOPHAGEAL ECHOCARDIOGRAM (TEE);  Surgeon: Antoine Poche, MD;  [facility];  Service: Endoscopy;  Laterality: N/A;  . TEE WITHOUT CARDIOVERSION N/A 05/21/2018   Procedure: TRANSESOPHAGEAL ECHOCARDIOGRAM (TEE);  Surgeon: Antoine Poche, MD;  [facility];  Service: Endoscopy;  Laterality: N/A;    Current Outpatient Medications  Medication Sig Dispense Refill  . ARIPiprazole (ABILIFY) 10 MG tablet Take 10 mg by mouth daily.    Marland Kitchen aspirin EC 81 MG tablet Take 81 mg by  mouth daily.    . carvedilol (COREG) 3.125 MG tablet Take 6.25 mg by mouth 2 (two) times daily.  6  . DULoxetine (CYMBALTA) 20 MG capsule Take 20 mg by mouth 2 (two) times daily.    . enalapril (VASOTEC) 10 MG tablet Take 1 tablet (10 mg total) by mouth daily. 90 tablet 3  . furosemide (LASIX) 40 MG tablet TAKE ONE TABLET BY MOUTH TWICE DAILY 180 tablet 2  . Melatonin 10 MG TABS Take 20 mg by mouth at bedtime.     Marland Kitchen omeprazole (PRILOSEC) 20 MG capsule Take 20 mg by mouth daily before breakfast.     . spironolactone (ALDACTONE) 25 MG tablet Take 0.5 tablets (12.5 mg total) by mouth daily. 45 tablet 3  . traZODone  (DESYREL) 100 MG tablet Take 300 mg by mouth at bedtime.     . sertraline (ZOLOFT) 100 MG tablet Take 150 mg by mouth daily. (Patient not taking: Reported on 11/27/2019)    . zolpidem (AMBIEN) 10 MG tablet Take 1 tablet by mouth at bedtime. As needed (Patient not taking: Reported on 11/27/2019)     No current facility-administered medications for this visit.    Allergies:   Patient has no known allergies.   Social History:  The patient  reports that she has never smoked. She has never used smokeless tobacco. She reports current alcohol use of about 7.0 standard drinks of alcohol per week. She reports that she does not use drugs.   Family History:  The patient's   family history includes Arthritis in her sister; Arthritis-Osteo in her sister, sister, and sister; Atrial fibrillation in her sister; CAD in her brother, brother, and sister; Cancer in her sister; Cancer - Prostate in her father; Depression in her sister; Diabetes in her mother and sister; Fibromyalgia in her sister; Heart attack in her brother and brother; Heart failure in her mother; Hypertension in her brother, brother, mother, sister, sister, and sister; Mitral valve prolapse in her sister; Osteoporosis in her sister; Pneumonia in her sister; Stroke in her mother.   ROS:  Please see the history of present illness.   All other systems are personally reviewed and negative.    Exam:    Vital Signs:  BP 121/67   Pulse 66   Ht 5\' 5"  (1.651 m)   BMI 38.77 kg/m        Labs/Other Tests and Data Reviewed:    Recent Labs: No results found for requested labs within last 8760 hours.   Wt Readings from Last 3 Encounters:  11/27/19 233 lb (105.7 kg)  01/13/19 204 lb 9.6 oz (92.8 kg)  05/21/18 198 lb (89.8 kg)     Other studies personally reviewed: Additional studies/ records that were reviewed today include:   Last device remote is reviewed from PaceART PDF  which reveals normal device function,   arrhythmias - none      ASSESSMENT & PLAN:   Complete heart block status post AV junction ablation    Cardiomyopathy-nonischemic ejection fraction 50-55 >>35  HFpEF//HFrEF acute chronic class 3 .   Pacemaker  medtronic     Atrial tachy    Syncope  Mitral regurgitation- moderate central   With cardiomyopathy will begin low dose aldactone, choosing that over entresto given the greater incremental benefit.  Will need BMET in 2 weeks  No significant improvement w AV synchrony ( hope and prayer)  So will revisit the idea of CRT upgrade for LV dysfunction and MR  Reviewed with  Dr Mitchell Heir, his impression is that MR may be 2/2 dyssynchrony so will revisit and then decide re upgrade to which she is amenable     Follow-up:  telehealth visit  Following TEE      Current medicines are reviewed at length with the patient today.   The patient does not have concerns regarding her medicines.  The following changes were made today:  none  Labs/ tests ordered today include: TEE if in [place] with Dr Mitchell Heir, if in [place] with JBr No orders of the defined types were placed in this encounter.   Future tests ( post COVID )     Patient Risk:  after full review of this patients clinical status, I feel that they are at moderate  risk at this time.  Today, I have spent 10 minutes with the patient with telehealth technology discussing the above.  Signed, Sherryl Manges, MD  01/08/2020 4:21 PM     [facility] [address] [place] [address] [phone] (office) [phone] (fax)  Signed, Sherryl Manges, MD  01/08/2020 4:21 PM     [facility] [address] [place] [address] [phone] (office) [phone] (fax)

## 2020-01-09 ENCOUNTER — Telehealth: Payer: Self-pay

## 2020-01-09 NOTE — Telephone Encounter (Signed)
LVM on patients phone to call back about her monitor not working

## 2020-01-13 ENCOUNTER — Telehealth: Payer: Self-pay

## 2020-01-13 NOTE — Telephone Encounter (Signed)
Attempted phone call to pt to schedule TEE requested by Dr Graciela Husbands.  Left voicemail message to contact RN at [phone].

## 2020-01-13 NOTE — Telephone Encounter (Signed)
Spoke with pt's husband, DPR who states pt is not currently available.  Requested pt's husband to tell pt RN will contact her Thursday 01/15/2020.  Pt's husband verbalizes understanding and states he will make pt aware.  He thanked Charity fundraiser for call.

## 2020-01-13 NOTE — Telephone Encounter (Signed)
Patient returning call.

## 2020-01-14 NOTE — Telephone Encounter (Signed)
Calling patient to assist with monitor.   No answer, LMOVM.

## 2020-01-15 NOTE — Telephone Encounter (Signed)
Spoke with pt and discussed need for TEE per Dr Graciela Husbands.  Appointment scheduled with Dr Wyline Mood at [facility] for 01/29/2020 at 1115am with arrival at 0945am.  Pt will report for preop and covid screening on 01/27/2020 at 215pm.  Pt verbalizes understanding and agrees with current plan.  Pt advised TEE instruction letter will be placed on her MyChart.  Pt verbalizes understanding and agrees with current plan.

## 2020-01-15 NOTE — Telephone Encounter (Signed)
Attempted phone call to pt.  Call went straight to voicemail.  RN did not leave another voicemail message.  Will attempt call again later today.

## 2020-01-16 NOTE — Telephone Encounter (Signed)
Attempted phone call to pt.  Left message stating letter has been placed on pt's MyChart and to call office for any questions.

## 2020-01-20 NOTE — Telephone Encounter (Signed)
Spoke with pt and reviewed TEE instruction letter for procedure scheduled 01/29/2020. See letter for complete details. Pt verbalizes understanding and agrees with current plan.

## 2020-01-21 ENCOUNTER — Ambulatory Visit (INDEPENDENT_AMBULATORY_CARE_PROVIDER_SITE_OTHER): Payer: 59

## 2020-01-21 DIAGNOSIS — I442 Atrioventricular block, complete: Secondary | ICD-10-CM

## 2020-01-23 LAB — CUP PACEART REMOTE DEVICE CHECK
Battery Impedance: 2492 Ohm
Battery Remaining Longevity: 24 mo
Battery Voltage: 2.72 V
Brady Statistic AP VP Percent: 3 %
Brady Statistic AP VS Percent: 0 %
Brady Statistic AS VP Percent: 97 %
Brady Statistic AS VS Percent: 0 %
Date Time Interrogation Session: 20211012134249
Implantable Lead Implant Date: 20100201
Implantable Lead Implant Date: 20100201
Implantable Lead Location: 753859
Implantable Lead Location: 753860
Implantable Lead Model: 5076
Implantable Lead Model: 5076
Implantable Pulse Generator Implant Date: 20100201
Lead Channel Impedance Value: 439 Ohm
Lead Channel Impedance Value: 519 Ohm
Lead Channel Pacing Threshold Amplitude: 0.75 V
Lead Channel Pacing Threshold Amplitude: 0.75 V
Lead Channel Pacing Threshold Pulse Width: 0.4 ms
Lead Channel Pacing Threshold Pulse Width: 0.4 ms
Lead Channel Setting Pacing Amplitude: 1.5 V
Lead Channel Setting Pacing Amplitude: 2.5 V
Lead Channel Setting Pacing Pulse Width: 0.4 ms
Lead Channel Setting Sensing Sensitivity: 2 mV

## 2020-01-23 NOTE — Telephone Encounter (Signed)
Called patient to advised her that her monitor is now working. Transmission on 01/20/20, normal device function.   No answer, LMOVM.

## 2020-01-26 ENCOUNTER — Telehealth: Payer: Self-pay | Admitting: Cardiology

## 2020-01-26 NOTE — Telephone Encounter (Signed)
Needs preop order for TEE

## 2020-01-26 NOTE — Progress Notes (Signed)
Remote pacemaker transmission.   

## 2020-01-27 ENCOUNTER — Encounter (HOSPITAL_COMMUNITY): Payer: Self-pay

## 2020-01-27 ENCOUNTER — Other Ambulatory Visit: Payer: Self-pay

## 2020-01-27 ENCOUNTER — Encounter (HOSPITAL_COMMUNITY)
Admission: RE | Admit: 2020-01-27 | Discharge: 2020-01-27 | Disposition: A | Discharge status: Home / Self Care | Payer: 59 | Source: Ambulatory Visit | Attending: Cardiology | Admitting: Cardiology

## 2020-01-27 ENCOUNTER — Other Ambulatory Visit (HOSPITAL_COMMUNITY)
Admission: RE | Admit: 2020-01-27 | Discharge: 2020-01-27 | Disposition: A | Discharge status: Home / Self Care | Payer: 59 | Source: Ambulatory Visit | Attending: Cardiology | Admitting: Cardiology

## 2020-01-27 DIAGNOSIS — Z01812 Encounter for preprocedural laboratory examination: Secondary | ICD-10-CM | POA: Insufficient documentation

## 2020-01-27 DIAGNOSIS — Z01810 Encounter for preprocedural cardiovascular examination: Secondary | ICD-10-CM | POA: Insufficient documentation

## 2020-01-27 DIAGNOSIS — Z20822 Contact with and (suspected) exposure to covid-19: Secondary | ICD-10-CM | POA: Insufficient documentation

## 2020-01-27 NOTE — Patient Instructions (Signed)
DEVONY MCGRADY  01/27/2020     @PREFPERIOPPHARMACY @   Your procedure is scheduled on 01/29/2020.  Report to 01/31/2020 at 9:30 A.M.  Call this number if you have problems the morning of surgery:  [phone]   Remember:  Do not eat or drink after midnight.      Take these medicines the morning of surgery with A SIP OF WATER : Carvedilol, Cymbalta and Prilosec    Do not wear jewelry, make-up or nail polish.  Do not wear lotions, powders, or perfumes, or deodorant.  Do not shave 48 hours prior to surgery.  Men may shave face and neck.  Do not bring valuables to the hospital.  [facility] is not responsible for any belongings or valuables.  Contacts, dentures or bridgework may not be worn into surgery.  Leave your suitcase in the car.  After surgery it may be brought to your room.  For patients admitted to the hospital, discharge time will be determined by your treatment team.  Patients discharged the day of surgery will not be allowed to drive home.   Name and phone number of your driver:   family Special instructions:  n/a  Please read over the following fact sheets that you were given. Care and Recovery After Surgery    Transesophageal Echocardiogram Transesophageal echocardiogram (TEE) is a test that uses sound waves to take pictures of your heart. TEE is done by passing a flexible tube down the esophagus. The esophagus is the tube that carries food from the throat to the stomach. The pictures give detailed images of your heart. This can help your doctor see if there are problems with your heart. What happens before the procedure? Staying hydrated Follow instructions from your doctor about hydration, which may include:  Up to 3 hours before the procedure - you may continue to drink clear liquids, such as: ? Water. ? Clear fruit juice. ? Black coffee. ? Plain tea.  Eating and drinking Follow instructions from your doctor about eating and drinking, which may  include:  8 hours before the procedure - stop eating heavy meals or foods such as meat, fried foods, or fatty foods.  6 hours before the procedure - stop eating light meals or foods, such as toast or cereal.  6 hours before the procedure - stop drinking milk or drinks that contain milk.  3 hours before the procedure - stop drinking clear liquids. General instructions  You will need to take out any dentures or retainers.  Plan to have someone take you home from the hospital or clinic.  If you will be going home right after the procedure, plan to have someone with you for 24 hours.  Ask your doctor about: ? Changing or stopping your normal medicines. This is important if you take diabetes medicines or blood thinners. ? Taking over-the-counter medicines, vitamins, herbs, and supplements. ? Taking medicines such as aspirin and ibuprofen. These medicines can thin your blood. Do not take these medicines unless your doctor tells you to take them. What happens during the procedure?  To lower your risk of infection, your doctors will wash or clean their hands.  An IV will be put into one of your veins.  You will be given a medicine to help you relax (sedative).  A medicine may be sprayed or gargled. This numbs the back of your throat.  Your blood pressure, heart rate, and breathing will be watched.  You may be asked to lay on your  left side.  A bite block will be placed in your mouth. This keeps you from biting the tube.  The tip of the TEE probe will be placed into the back of your mouth.  You will be asked to swallow.  Your doctor will take pictures of your heart.  The probe and bite block will be taken out. The procedure may vary among doctors and hospitals. What happens after the procedure?   Your blood pressure, heart rate, breathing rate, and blood oxygen level will be watched until the medicines you were given have worn off.  When you first wake up, your throat may  feel sore and numb. This will get better over time. You will not be allowed to eat or drink until the numbness has gone away.  Do not drive for 24 hours if you were given a medicine to help you relax. Summary  TEE is a test that uses sound waves to take pictures of your heart.  You will be given a medicine to help you relax.  Do not drive for 24 hours if you were given a medicine to help you relax. This information is not intended to replace advice given to you by your health care provider. Make sure you discuss any questions you have with your health care provider. Document Revised: 12/14/2017 Document Reviewed: 06/28/2016 Elsevier Patient Education  2020 [employer].

## 2020-01-28 LAB — SARS CORONAVIRUS 2 (TAT 6-24 HRS): SARS Coronavirus 2: NEGATIVE

## 2020-01-29 ENCOUNTER — Ambulatory Visit (HOSPITAL_COMMUNITY)
Admission: RE | Admit: 2020-01-29 | Discharge: 2020-01-29 | Disposition: A | Discharge status: Home / Self Care | Payer: 59 | Attending: Cardiology | Admitting: Cardiology

## 2020-01-29 ENCOUNTER — Encounter (HOSPITAL_COMMUNITY): Payer: Self-pay | Admitting: Cardiology

## 2020-01-29 ENCOUNTER — Ambulatory Visit (HOSPITAL_COMMUNITY): Payer: 59 | Admitting: Anesthesiology

## 2020-01-29 ENCOUNTER — Encounter (HOSPITAL_COMMUNITY)
Admission: RE | Disposition: A | Discharge status: Home / Self Care | Payer: 59 | Source: Home / Self Care | Attending: Cardiology

## 2020-01-29 ENCOUNTER — Ambulatory Visit (HOSPITAL_BASED_OUTPATIENT_CLINIC_OR_DEPARTMENT_OTHER): Payer: 59

## 2020-01-29 DIAGNOSIS — I340 Nonrheumatic mitral (valve) insufficiency: Secondary | ICD-10-CM

## 2020-01-29 DIAGNOSIS — Z7982 Long term (current) use of aspirin: Secondary | ICD-10-CM | POA: Diagnosis not present

## 2020-01-29 DIAGNOSIS — I081 Rheumatic disorders of both mitral and tricuspid valves: Secondary | ICD-10-CM | POA: Insufficient documentation

## 2020-01-29 DIAGNOSIS — K219 Gastro-esophageal reflux disease without esophagitis: Secondary | ICD-10-CM | POA: Insufficient documentation

## 2020-01-29 DIAGNOSIS — Z79899 Other long term (current) drug therapy: Secondary | ICD-10-CM | POA: Insufficient documentation

## 2020-01-29 DIAGNOSIS — Z950 Presence of cardiac pacemaker: Secondary | ICD-10-CM | POA: Diagnosis not present

## 2020-01-29 DIAGNOSIS — I5032 Chronic diastolic (congestive) heart failure: Secondary | ICD-10-CM | POA: Diagnosis not present

## 2020-01-29 DIAGNOSIS — F329 Major depressive disorder, single episode, unspecified: Secondary | ICD-10-CM | POA: Diagnosis not present

## 2020-01-29 DIAGNOSIS — I361 Nonrheumatic tricuspid (valve) insufficiency: Secondary | ICD-10-CM

## 2020-01-29 DIAGNOSIS — R55 Syncope and collapse: Secondary | ICD-10-CM | POA: Diagnosis not present

## 2020-01-29 DIAGNOSIS — I253 Aneurysm of heart: Secondary | ICD-10-CM | POA: Insufficient documentation

## 2020-01-29 DIAGNOSIS — I442 Atrioventricular block, complete: Secondary | ICD-10-CM | POA: Insufficient documentation

## 2020-01-29 DIAGNOSIS — I4891 Unspecified atrial fibrillation: Secondary | ICD-10-CM | POA: Diagnosis not present

## 2020-01-29 DIAGNOSIS — I428 Other cardiomyopathies: Secondary | ICD-10-CM | POA: Diagnosis not present

## 2020-01-29 HISTORY — PX: TEE WITHOUT CARDIOVERSION: SHX5443

## 2020-01-29 SURGERY — ECHOCARDIOGRAM, TRANSESOPHAGEAL
Anesthesia: General

## 2020-01-29 MED ORDER — FENTANYL CITRATE (PF) 100 MCG/2ML IJ SOLN: 25.0000 ug | INTRAMUSCULAR | Status: DC | PRN

## 2020-01-29 MED ORDER — LIDOCAINE VISCOUS HCL 2 % MT SOLN
OROMUCOSAL | Status: AC
  Filled 2020-01-29: qty 15

## 2020-01-29 MED ORDER — LIDOCAINE VISCOUS HCL 2 % MT SOLN
15.0000 mL | Freq: Once | OROMUCOSAL | Status: AC
  Administered 2020-01-29: 15 mL via OROMUCOSAL

## 2020-01-29 MED ORDER — LACTATED RINGERS IV SOLN
INTRAVENOUS | Status: DC
  Administered 2020-01-29: 1000 mL via INTRAVENOUS

## 2020-01-29 MED ORDER — SODIUM CHLORIDE 0.9 % IV SOLN: INTRAVENOUS | Status: DC

## 2020-01-29 MED ORDER — PROPOFOL 500 MG/50ML IV EMUL
INTRAVENOUS | Status: DC | PRN
  Administered 2020-01-29: 150 ug/kg/min via INTRAVENOUS

## 2020-01-29 MED ORDER — LACTATED RINGERS IV SOLN: INTRAVENOUS | Status: DC | PRN

## 2020-01-29 MED ORDER — PROPOFOL 10 MG/ML IV BOLUS
INTRAVENOUS | Status: DC | PRN
  Administered 2020-01-29: 60 mg via INTRAVENOUS

## 2020-01-29 MED ORDER — GLYCOPYRROLATE 0.2 MG/ML IJ SOLN
INTRAMUSCULAR | Status: AC
  Filled 2020-01-29: qty 1

## 2020-01-29 MED ORDER — EPHEDRINE SULFATE 50 MG/ML IJ SOLN
INTRAMUSCULAR | Status: DC | PRN
  Administered 2020-01-29: 10 mg via INTRAVENOUS

## 2020-01-29 MED ORDER — SODIUM CHLORIDE BACTERIOSTATIC 0.9 % IJ SOLN
INTRAMUSCULAR | Status: AC
  Filled 2020-01-29: qty 10

## 2020-01-29 MED ORDER — GLYCOPYRROLATE 0.2 MG/ML IJ SOLN
0.2000 mg | Freq: Once | INTRAMUSCULAR | Status: AC
  Administered 2020-01-29: 0.2 mg via INTRAVENOUS

## 2020-01-29 NOTE — CV Procedure (Signed)
CV Procedure Note  Procedure: TEE Indication: Mitral regurgitation Physician: Dr [name] MD   Patient brought to procedure suite after appropriate consent was obtained. Sedation achieved with assistance of anesthesiology, please see there note for details. Bit block placed, patient placed in the left lateral decubitus position. TEE probe intubated into esophagus without troubles, and several views were obtained. Cardiopulmonary monitoring was performed throughout the procedure, she tolerated well without complications.    Please see full TEE report, prelim review suggests moderate MR   [name] MD

## 2020-01-29 NOTE — Anesthesia Postprocedure Evaluation (Signed)
Anesthesia Post Note  Patient: [name] [name]  Procedure(s) Performed: TRANSESOPHAGEAL ECHOCARDIOGRAM (TEE) WITH PROPOFOL (N/A )  Patient location during evaluation: PACU Anesthesia Type: General Level of consciousness: awake, oriented, awake and alert and patient cooperative Pain management: satisfactory to patient Vital Signs Assessment: post-procedure vital signs reviewed and stable Respiratory status: spontaneous breathing, respiratory function stable and nonlabored ventilation Cardiovascular status: stable Postop Assessment: no apparent nausea or vomiting Anesthetic complications: no   No complications documented.   Last Vitals:  Vitals:   01/29/20 1015  BP: (!) 89/57  Pulse: 94  Resp: 14  Temp: 36.4 C  SpO2: 100%    Last Pain:  Vitals:   01/29/20 1015  TempSrc: Oral  PainSc: 0-No pain                 Charbel Los

## 2020-01-29 NOTE — Anesthesia Preprocedure Evaluation (Signed)
Anesthesia Evaluation  Patient identified by MRN, date of birth, ID band Patient awake    Reviewed: Allergy & Precautions, H&P , NPO status , Patient's Chart, lab work & pertinent test results, reviewed documented beta blocker date and time   Airway Mallampati: II  TM Distance: >3 FB Neck ROM: full    Dental no notable dental hx. (+) Teeth Intact   Pulmonary neg pulmonary ROS,    Pulmonary exam normal breath sounds clear to auscultation       Cardiovascular Exercise Tolerance: Good + dysrhythmias (Complete AV block) + pacemaker  Rhythm:regular Rate:Normal     Neuro/Psych PSYCHIATRIC DISORDERS Depression negative neurological ROS     GI/Hepatic Neg liver ROS, GERD  Medicated,  Endo/Other  negative endocrine ROS  Renal/GU negative Renal ROS  negative genitourinary   Musculoskeletal   Abdominal   Peds  Hematology  (+) Blood dyscrasia, anemia ,   Anesthesia Other Findings   Reproductive/Obstetrics negative OB ROS                             Anesthesia Physical Anesthesia Plan  ASA: III  Anesthesia Plan: General   Post-op Pain Management:    Induction:   PONV Risk Score and Plan: Propofol infusion  Airway Management Planned:   Additional Equipment:   Intra-op Plan:   Post-operative Plan:   Informed Consent: I have reviewed the patients History and Physical, chart, labs and discussed the procedure including the risks, benefits and alternatives for the proposed anesthesia with the patient or authorized representative who has indicated his/her understanding and acceptance.     Dental Advisory Given  Plan Discussed with: CRNA  Anesthesia Plan Comments:         Anesthesia Quick Evaluation

## 2020-01-29 NOTE — Transfer of Care (Signed)
Immediate Anesthesia Transfer of Care Note  Patient: [name] [name]  Procedure(s) Performed: TRANSESOPHAGEAL ECHOCARDIOGRAM (TEE) WITH PROPOFOL (N/A )  Patient Location: PACU  Anesthesia Type:General  Level of Consciousness: awake, alert , oriented and patient cooperative  Airway & Oxygen Therapy: Patient Spontanous Breathing  Post-op Assessment: Report given to RN, Post -op Vital signs reviewed and stable and Patient moving all extremities X 4  Post vital signs: Reviewed and stable  Last Vitals:  Vitals Value Taken Time  BP    Temp    Pulse    Resp    SpO2      Last Pain:  Vitals:   01/29/20 1015  TempSrc: Oral  PainSc: 0-No pain      Patients Stated Pain Goal: 5 (01/29/20 1015)  Complications: No complications documented.

## 2020-01-29 NOTE — Progress Notes (Signed)
Patient arrived in Endo room 3 for a transesophageal echocardiogram per Dr. Wyline Mood under sedation per anesthesia. Procedure started @ 1115 completed at 1130. Patient tolerated procedure well

## 2020-01-29 NOTE — Progress Notes (Signed)
*  PRELIMINARY RESULTS* Echocardiogram Echocardiogram Transesophageal has been performed.  [name] [name] 01/29/2020, 1:04 PM

## 2020-01-29 NOTE — H&P (Signed)
Patient presents for outpatient TEE for further evaluation of mitral regurgitation. For full medical history please refer to references recent cardiology clinic note below.  [name] MD   Electrophysiology TeleHealth Note   Due to national recommendations of social distancing due to COVID 19, an audio/video telehealth visit is felt to be most appropriate for this patient at this time.  See MyChart message from today for the patient's consent to telehealth for [facility].   Date:  01/08/2020   ID:  [name] [name], DOB November 01, 1960, MRN [id number]  Location: patient's home  Provider location: [address] [place]  Evaluation Performed: Follow-up visit  PCP:  Estanislado Pandy, MD      Cardiologist:     Electrophysiologist:  SK   Chief Complaint:  Complete heart block and cardiomyopathy  History of Present Illness:    [name] [name] is a 59 y.o. female who presents via audio/video conferencing for a telehealth visit today.  Since last being seen in our clinic for  followup with history of tachycardia induced cardiomyopathy status post AV ablation and DDD pacing in 1995. She underwent generator replacement 2010  Interval deterioration of LV function See Below --reminds me of hospitalization at [facility] last 10/20 for acute CHF with flash pulm edema  Date Cr K Hgb  10/20 0.69 3.6 13.5      Syncope 9/20 seen at [facility]  DATE TEST EF   10/15 Echo 55% MR mod-severe  12/15 TEE 55% MR 2 jets, mild-mod  4/18 LHC 35-45% CA normal  1/20 Echo  40-45% MR md  2/20 TEE 45-50 MVP mild/MR mod  10/20 Echo (CE)  40% MR mild-mod    the patient reports With interval weight gain more dyspnea,  Some edema  No chest pain  Previously disinclined to consider CRT upgrade as she was doing pretty well  SOB at 150 ft and even with [place]   The patient denies symptoms of fevers, chills, cough, or new SOB worrisome for  COVID 19.        Past Medical History:  Diagnosis Date  . Anemia   . Atrial fibrillation (HCC)   . Chronic diastolic heart failure (HCC)    EF 40-45%  . Depression    history of depression in the past.  . GERD (gastroesophageal reflux disease)   . Mitral valvular regurgitation   . Pacemaker- medtronic    Initial PM 1995; generator 2009, infection with contralateral implant 2010  . S/P AV nodal ablation   . Shortness of breath dyspnea          Past Surgical History:  Procedure Laterality Date  . ABDOMINAL ADHESION SURGERY  04/2006  . CARDIAC CATHETERIZATION  09/11/2007  . CARDIAC PACEMAKER PLACEMENT  1995   [facility],Medtronic Adapta ADDRL1  . CATARACT EXTRACTION, BILATERAL    . GASTRIC BYPASS  09/2003  . LEFT HEART CATH AND CORONARY ANGIOGRAPHY N/A 07/18/2016   Procedure: Left Heart Cath and Coronary Angiography;  Surgeon: Lennette Bihari, MD;  Location: [facility] INVASIVE CV LAB;  Service: Cardiovascular;  Laterality: N/A;  . TEE WITHOUT CARDIOVERSION N/A 03/19/2014   Procedure: TRANSESOPHAGEAL ECHOCARDIOGRAM (TEE);  Surgeon: Antoine Poche, MD;  Location: [facility] ENDO SUITE;  Service: Endoscopy;  Laterality: N/A;  . TEE WITHOUT CARDIOVERSION N/A 05/21/2018   Procedure: TRANSESOPHAGEAL ECHOCARDIOGRAM (TEE);  Surgeon: Antoine Poche, MD;  Location: [facility] ENDO SUITE;  Service: Endoscopy;  Laterality: N/A;  Current Outpatient Medications  Medication Sig Dispense Refill  . ARIPiprazole (ABILIFY) 10 MG tablet Take 10 mg by mouth daily.    Marland Kitchen aspirin EC 81 MG tablet Take 81 mg by mouth daily.    . carvedilol (COREG) 3.125 MG tablet Take 6.25 mg by mouth 2 (two) times daily.  6  . DULoxetine (CYMBALTA) 20 MG capsule Take 20 mg by mouth 2 (two) times daily.    . enalapril (VASOTEC) 10 MG tablet Take 1 tablet (10 mg total) by mouth daily. 90 tablet 3  . furosemide (LASIX) 40 MG tablet TAKE ONE TABLET BY MOUTH TWICE DAILY 180 tablet 2  .  Melatonin 10 MG TABS Take 20 mg by mouth at bedtime.     Marland Kitchen omeprazole (PRILOSEC) 20 MG capsule Take 20 mg by mouth daily before breakfast.     . spironolactone (ALDACTONE) 25 MG tablet Take 0.5 tablets (12.5 mg total) by mouth daily. 45 tablet 3  . traZODone (DESYREL) 100 MG tablet Take 300 mg by mouth at bedtime.     . sertraline (ZOLOFT) 100 MG tablet Take 150 mg by mouth daily. (Patient not taking: Reported on 11/27/2019)    . zolpidem (AMBIEN) 10 MG tablet Take 1 tablet by mouth at bedtime. As needed (Patient not taking: Reported on 11/27/2019)     No current facility-administered medications for this visit.    Allergies:   Patient has no known allergies.   Social History:  The patient  reports that she has never smoked. She has never used smokeless tobacco. She reports current alcohol use of about 7.0 standard drinks of alcohol per week. She reports that she does not use drugs.   Family History:  The patient's   family history includes Arthritis in her sister; Arthritis-Osteo in her sister, sister, and sister; Atrial fibrillation in her sister; CAD in her brother, brother, and sister; Cancer in her sister; Cancer - Prostate in her father; Depression in her sister; Diabetes in her mother and sister; Fibromyalgia in her sister; Heart attack in her brother and brother; Heart failure in her mother; Hypertension in her brother, brother, mother, sister, sister, and sister; Mitral valve prolapse in her sister; Osteoporosis in her sister; Pneumonia in her sister; Stroke in her mother.   ROS:  Please see the history of present illness.   All other systems are personally reviewed and negative.    Exam:    Vital Signs:  BP 121/67   Pulse 66   Ht 5\' 5"  (1.651 m)   BMI 38.77 kg/m        Labs/Other Tests and Data Reviewed:    Recent Labs: No results found for requested labs within last 8760 hours.      Wt Readings from Last 3 Encounters:  11/27/19 233 lb (105.7 kg)   01/13/19 204 lb 9.6 oz (92.8 kg)  05/21/18 198 lb (89.8 kg)     Other studies personally reviewed: Additional studies/ records that were reviewed today include:   Last device remote is reviewed from [employer] PDF  which reveals normal device function,   arrhythmias - none     ASSESSMENT & PLAN:   Complete heart block status post AV junction ablation   Cardiomyopathy-nonischemic ejection fraction 50-55>>35  HFpEF//HFrEFacute chronic class 3 .  Pacemaker medtronic    Atrial tachy   Syncope  Mitral regurgitation- moderate central   With cardiomyopathy will begin low dose aldactone, choosing that over entresto given the greater incremental benefit.  Will need BMET in  2 weeks  No significant improvement w AV synchrony ( hope and prayer)  So will revisit the idea of CRT upgrade for LV dysfunction and MR  Reviewed with Dr Mitchell Heir, his impression is that MR may be 2/2 dyssynchrony so will revisit and then decide re upgrade to which she is amenable     Follow-up:  telehealth visit  Following TEE      Current medicines are reviewed at length with the patient today.   The patient does not have concerns regarding her medicines.  The following changes were made today:  none  Labs/ tests ordered today include: TEE if in [place] with Dr Mitchell Heir, if in [place] with J[facility] No orders of the defined types were placed in this encounter.   Future tests ( post COVID )     Patient Risk:  after full review of this patients clinical status, I feel that they are at moderate  risk at this time.  Today, I have spent 10 minutes with the patient with telehealth technology discussing the above.  Signed, Sherryl Manges, MD  01/08/2020 4:21 PM

## 2020-02-03 ENCOUNTER — Encounter (HOSPITAL_COMMUNITY): Payer: Self-pay | Admitting: Cardiology

## 2020-03-11 ENCOUNTER — Encounter: Payer: Self-pay | Admitting: Internal Medicine

## 2020-03-11 ENCOUNTER — Other Ambulatory Visit: Payer: Self-pay

## 2020-03-11 ENCOUNTER — Telehealth (INDEPENDENT_AMBULATORY_CARE_PROVIDER_SITE_OTHER): Payer: 59 | Admitting: Internal Medicine

## 2020-03-11 VITALS — BP 103/54 | HR 84 | Ht 65.0 in | Wt 235.0 lb

## 2020-03-11 DIAGNOSIS — I428 Other cardiomyopathies: Secondary | ICD-10-CM

## 2020-03-11 DIAGNOSIS — I5032 Chronic diastolic (congestive) heart failure: Secondary | ICD-10-CM | POA: Diagnosis not present

## 2020-03-11 DIAGNOSIS — I442 Atrioventricular block, complete: Secondary | ICD-10-CM | POA: Diagnosis not present

## 2020-03-11 DIAGNOSIS — I471 Supraventricular tachycardia: Secondary | ICD-10-CM | POA: Diagnosis not present

## 2020-03-11 DIAGNOSIS — Z950 Presence of cardiac pacemaker: Secondary | ICD-10-CM

## 2020-03-11 NOTE — Progress Notes (Signed)
Electrophysiology TeleHealth Note   Due to national recommendations of social distancing due to COVID 19, an audio/video telehealth visit is felt to be most appropriate for this patient at this time.  See MyChart message from today for the patient's consent to telehealth for [facility].   Date:  03/11/2020   ID:  [name] [name], DOB Oct 06, 1960, MRN [id number]  Location: patient's home  Provider location: [address] [place]  Evaluation Performed: Follow-up visit  PCP:  Estanislado Pandy, MD  Cardiologist:     Electrophysiologist:  SK   Chief Complaint:  Complete heart block and cardiomyopathy  History of Present Illness:    [name] [name] is a 59 y.o. female who presents via audio/video conferencing for a telehealth visit today.  Since last being seen in our clinic for  followup with history of tachycardia induced cardiomyopathy status post AV ablation and DDD pacing in 1995. She underwent generator replacement 2010 she has undergone repeat TEE 10/21.  (JBr)  This demonstrated MR with two jets but only moderate  The issue has been whether her MRI was sufficiently bad   Interval deterioration of LV function See Below --reminds me of hospitalization at [facility] last 10/20 for acute CHF with flash pulm edema  The patient denies chest pain ,  Moderate shortness of breath w exertion,  nocturnal dyspnea occ*, 2-3 pillow orthopnea  mild  peripheral edema.  There have been no palpitations lightheadedness  or syncope.    Date Cr K Hgb  10/20 0.69 3.6 13.5  9/21  5.0       Syncope 9/20 seen at [facility]  DATE TEST EF   10/15 Echo   55 % MR mod-severe  12/15 TEE  55 % MR 2 jets, mild-mod  4/18 LHC 35-45% CA normal  1/20 Echo  40-45% MR md  2/20 TEE 45-50 MVP mild/MR mod  10/20 Echo (CE)  40% MR mild-mod  10/21 TEE EF 45-50% MR  mod     Previously disinclined to consider CRT upgrade as she was doing pretty well  SOB at 150 ft and even with a slight  in[place] the [place]   The patient denies symptoms of fevers, chills, cough, or new SOB worrisome for COVID 19.    Past Medical History:  Diagnosis Date  . Anemia   . Atrial fibrillation (HCC)   . Chronic diastolic heart failure (HCC)    EF 40-45%  . Depression    history of depression in the past.  . GERD (gastroesophageal reflux disease)   . Mitral valvular regurgitation   . Pacemaker- medtronic    Initial PM 1995; generator 2009, infection with contralateral implant 2010  . S/P AV nodal ablation   . Shortness of breath dyspnea     Past Surgical History:  Procedure Laterality Date  . ABDOMINAL ADHESION SURGERY  04/2006  . CARDIAC CATHETERIZATION  09/11/2007  . CARDIAC PACEMAKER PLACEMENT  1995   [facility],Medtronic Adapta ADDRL1  . CATARACT EXTRACTION, BILATERAL    . GASTRIC BYPASS  09/2003  . LEFT HEART CATH AND CORONARY ANGIOGRAPHY N/A 07/18/2016   Procedure: Left Heart Cath and Coronary Angiography;  Surgeon: Lennette Bihari, MD;  Location: [facility] INVASIVE CV LAB;  Service: Cardiovascular;  Laterality: N/A;  . TEE WITHOUT CARDIOVERSION N/A 03/19/2014   Procedure: TRANSESOPHAGEAL ECHOCARDIOGRAM (TEE);  Surgeon: Antoine Poche, MD;  [facility];  Service: Endoscopy;  Laterality: N/A;  .  TEE WITHOUT CARDIOVERSION N/A 05/21/2018   Procedure: TRANSESOPHAGEAL ECHOCARDIOGRAM (TEE);  Surgeon: Antoine Poche, MD;  [facility]: [facility];  Service: Endoscopy;  Laterality: N/A;  . TEE WITHOUT CARDIOVERSION N/A 01/29/2020   Procedure: TRANSESOPHAGEAL ECHOCARDIOGRAM (TEE) WITH PROPOFOL;  Surgeon: Antoine Poche, MD;  Location: [facility];  Service: Endoscopy;  Laterality: N/A;  scheduled by Mindi Junker @ Dr. Wilmon Arms office    Current Outpatient Medications  Medication Sig Dispense Refill  . ARIPiprazole (ABILIFY) 10 MG tablet Take 10 mg by mouth at bedtime.     Marland Kitchen aspirin EC 81 MG tablet Take 81 mg by mouth daily.    . carvedilol (COREG) 3.125 MG  tablet Take 6.25 mg by mouth 2 (two) times daily.  6  . DULoxetine (CYMBALTA) 20 MG capsule Take 20 mg by mouth 2 (two) times daily.    . enalapril (VASOTEC) 10 MG tablet Take 1 tablet (10 mg total) by mouth daily. 90 tablet 3  . furosemide (LASIX) 40 MG tablet Take 80 mg by mouth daily.    . Melatonin 10 MG TABS Take 20 mg by mouth at bedtime.     Marland Kitchen omeprazole (PRILOSEC) 20 MG capsule Take 20 mg by mouth daily before breakfast.     . spironolactone (ALDACTONE) 25 MG tablet Take 0.5 tablets (12.5 mg total) by mouth daily. 45 tablet 3  . traZODone (DESYREL) 100 MG tablet Take 200 mg by mouth at bedtime.      No current facility-administered medications for this visit.    Allergies:   Patient has no known allergies.   Social History:  The patient  reports that she has never smoked. She has never used smokeless tobacco. She reports previous alcohol use of about 7.0 standard drinks of alcohol per week. She reports that she does not use drugs.   Family History:  The patient's   family history includes Arthritis in her sister; Arthritis-Osteo in her sister, sister, and sister; Atrial fibrillation in her sister; CAD in her brother, brother, and sister; Cancer in her sister; Cancer - Prostate in her father; Depression in her sister; Diabetes in her mother and sister; Fibromyalgia in her sister; Heart attack in her brother and brother; Heart failure in her mother; Hypertension in her brother, brother, mother, sister, sister, and sister; Mitral valve prolapse in her sister; Osteoporosis in her sister; Pneumonia in her sister; Stroke in her mother.   ROS:  Please see the history of present illness.   All other systems are personally reviewed and negative.    Exam:    Vital Signs:  BP (!) 103/54   Pulse 84   Ht 5\' 5"  (1.651 m)   Wt 235 lb (106.6 kg)   BMI 39.11 kg/m        Labs/Other Tests and Data Reviewed:    Recent Labs: No results found for requested labs within last 8760 hours.   Wt  Readings from Last 3 Encounters:  03/11/20 235 lb (106.6 kg)  01/27/20 235 lb (106.6 kg)  11/27/19 233 lb (105.7 kg)     Other studies personally reviewed: Additional studies/ records that were reviewed today include:   Last device remote is reviewed from [employer] PDF  which reveals normal device function,   arrhythmias - none     ASSESSMENT & PLAN:   Complete heart block status post AV junction ablation    Cardiomyopathy-nonischemic ejection fraction 50-55 >>35  HFpEF//HFrEF  chronic class 3 .   Pacemaker  medtronic  Atrial tachy    Syncope  Mitral regurgitation- moderate central   Persistent problems with heart failure but she is loathe at this to go to hospital for CRT upgrade which is now, I think indicated for MR thought secondary, class 3 CHF and pacemaker cardiomyopathy  She would like to revisit this in about 6 months  We discussed trying entresto, with some LH upon standing will begin 24/26  She is aware to start 36 hrs after last dose of ACE ( vasotec)  Also with DOE and PND and some worsening orthopnea, have asked her on 3/7 days upcoming ( just the 3 times) to take 80 lasix bid        COVID 19 screen The patient denies symptoms of COVID 19 at this time.  The importance of social distancing was discussed today.  Follow-up:  63m Next remote: As Scheduled   Current medicines are reviewed at length with the patient today.   The patient does not have concerns regarding her medicines.  The following changes were made today:   1) stop vasotec 2) begin entresto 24/26 3) BMET with PCP in about 2 weeks 4) furosemide increasing from 80 daily to 80 bid for 3 of the next 7 days  Labs/ tests ordered today include: (As above)  No orders of the defined types were placed in this encounter.   Future tests ( post COVID )     Patient Risk:  after full review of this patients clinical status, I feel that they are at moderate risk at this time.  Today, I have  spent 11 minutes with the patient with telehealth technology discussing the above.  Signed, Sherryl Manges, MD  03/11/2020 12:43 PM     [facility] [address] [place] [address] [phone] (office) [phone] (fax)

## 2020-03-11 NOTE — Patient Instructions (Signed)
Medication Instructions:  *If you need a refill on your cardiac medications before your next appointment, please call your pharmacy*  Lab Work: Your physician recommends that you return for lab work in: 2 WEEKS - BMET -- order put in for lab collect, pt prefers to have labs drawn in [place]   If you have labs (blood work) drawn today and your tests are completely normal, you will receive your results only by: Marland Kitchen MyChart Message (if you have MyChart) OR . A paper copy in the mail If you have any lab test that is abnormal or we need to change your treatment, we will call you to review the results.  Follow-Up: At [facility], you and your health needs are our priority.  As part of our continuing mission to provide you with exceptional heart care, we have created designated Provider Care Teams.  These Care Teams include your primary Cardiologist (physician) and Advanced Practice Providers (APPs -  Physician Assistants and Nurse Practitioners) who all work together to provide you with the care you need, when you need it.  We recommend signing up for the patient portal called "MyChart".  Sign up information is provided on this After Visit Summary.  MyChart is used to connect with patients for Virtual Visits (Telemedicine).  Patients are able to view lab/test results, encounter notes, upcoming appointments, etc.  Non-urgent messages can be sent to your provider as well.   To learn more about what you can do with MyChart, go to [email or web address].    Your next appointment:   Your physician recommends that you schedule a follow-up appointment in: 6 MONTHS with Dr. Graciela Husbands -- 09/22/19 at 2:00 pm  The format for your next appointment:   In Person with Sherryl Manges, MD

## 2020-03-25 ENCOUNTER — Other Ambulatory Visit: Payer: 59

## 2020-05-03 ENCOUNTER — Ambulatory Visit (INDEPENDENT_AMBULATORY_CARE_PROVIDER_SITE_OTHER): Payer: 59

## 2020-05-03 DIAGNOSIS — I442 Atrioventricular block, complete: Secondary | ICD-10-CM

## 2020-05-05 LAB — CUP PACEART REMOTE DEVICE CHECK
Battery Impedance: 3782 Ohm
Battery Remaining Longevity: 13 mo
Battery Voltage: 2.69 V
Brady Statistic AP VP Percent: 4 %
Brady Statistic AP VS Percent: 0 %
Brady Statistic AS VP Percent: 96 %
Brady Statistic AS VS Percent: 0 %
Date Time Interrogation Session: 20220124121016
Implantable Lead Implant Date: 20100201
Implantable Lead Implant Date: 20100201
Implantable Lead Location: 753859
Implantable Lead Location: 753860
Implantable Lead Model: 5076
Implantable Lead Model: 5076
Implantable Pulse Generator Implant Date: 20100201
Lead Channel Impedance Value: 444 Ohm
Lead Channel Impedance Value: 522 Ohm
Lead Channel Pacing Threshold Amplitude: 0.75 V
Lead Channel Pacing Threshold Amplitude: 0.75 V
Lead Channel Pacing Threshold Pulse Width: 0.4 ms
Lead Channel Pacing Threshold Pulse Width: 0.4 ms
Lead Channel Setting Pacing Amplitude: 1.5 V
Lead Channel Setting Pacing Amplitude: 2.5 V
Lead Channel Setting Pacing Pulse Width: 0.4 ms
Lead Channel Setting Sensing Sensitivity: 2 mV

## 2020-05-13 NOTE — Progress Notes (Signed)
Remote pacemaker transmission.   

## 2020-08-02 ENCOUNTER — Ambulatory Visit (INDEPENDENT_AMBULATORY_CARE_PROVIDER_SITE_OTHER): Payer: 59

## 2020-08-02 DIAGNOSIS — I471 Supraventricular tachycardia: Secondary | ICD-10-CM | POA: Diagnosis not present

## 2020-08-03 ENCOUNTER — Telehealth: Payer: Self-pay

## 2020-08-03 LAB — CUP PACEART REMOTE DEVICE CHECK
Battery Impedance: 5410 Ohm
Battery Remaining Longevity: 4 mo
Battery Voltage: 2.61 V
Brady Statistic AP VP Percent: 4 %
Brady Statistic AP VS Percent: 0 %
Brady Statistic AS VP Percent: 96 %
Brady Statistic AS VS Percent: 0 %
Date Time Interrogation Session: 20220426110714
Implantable Lead Implant Date: 20100201
Implantable Lead Implant Date: 20100201
Implantable Lead Location: 753859
Implantable Lead Location: 753860
Implantable Lead Model: 5076
Implantable Lead Model: 5076
Implantable Pulse Generator Implant Date: 20100201
Lead Channel Impedance Value: 410 Ohm
Lead Channel Impedance Value: 540 Ohm
Lead Channel Pacing Threshold Amplitude: 0.75 V
Lead Channel Pacing Threshold Amplitude: 0.75 V
Lead Channel Pacing Threshold Pulse Width: 0.4 ms
Lead Channel Pacing Threshold Pulse Width: 0.4 ms
Lead Channel Setting Pacing Amplitude: 1.5 V
Lead Channel Setting Pacing Amplitude: 2.5 V
Lead Channel Setting Pacing Pulse Width: 0.4 ms
Lead Channel Setting Sensing Sensitivity: 2 mV

## 2020-08-03 MED ORDER — ENTRESTO 24-26 MG PO TABS: 1.0000 | ORAL_TABLET | Freq: Two times a day (BID) | ORAL | 1 refills | Status: DC

## 2020-08-03 MED ORDER — ENTRESTO 24-26 MG PO TABS
1.0000 | ORAL_TABLET | Freq: Two times a day (BID) | ORAL | 1 refills | Status: DC
Start: 2020-08-03 — End: 2020-08-03

## 2020-08-03 NOTE — Telephone Encounter (Signed)
Carelink alert received for battery longevity <4 months. Monthly remotes scheduled. Successful telephone encounter to patient to update. Patient is manual transmission and provided dates she is to send remotes. Patient appreciative of call.

## 2020-08-10 ENCOUNTER — Other Ambulatory Visit: Payer: Self-pay

## 2020-08-10 DIAGNOSIS — I5032 Chronic diastolic (congestive) heart failure: Secondary | ICD-10-CM

## 2020-08-18 ENCOUNTER — Telehealth: Payer: Self-pay

## 2020-08-18 NOTE — Progress Notes (Signed)
Remote pacemaker transmission.   

## 2020-08-18 NOTE — Telephone Encounter (Signed)
**Note De-Identified [name] [name] Obfuscation** The pt left a completed [employer] pt asst application for Entresto at the office. I have completed the provider page of the application and emailed all to Dr Odessa Fleming nurse so she can obtain his signature, date it and to fax all to [employer] at fax number written on cover letter included or to place in the nurses box in medical Records to be faxed.

## 2020-08-20 NOTE — Telephone Encounter (Addendum)
**Note De-Identified Din Bookwalter Obfuscation** Letter recieved from [employer] pt asst [employer] stating that they approved the pt for asst for her Entresto until 04/09/2021. Pt ID: [id number]  The letter states that they have notified the pt of this approval as well.

## 2020-08-26 LAB — BASIC METABOLIC PANEL
BUN/Creatinine Ratio: 22 (ref 9–23)
BUN: 19 mg/dL (ref 6–24)
CO2: 26 mmol/L (ref 20–29)
Calcium: 9 mg/dL (ref 8.7–10.2)
Chloride: 102 mmol/L (ref 96–106)
Creatinine, Ser: 0.87 mg/dL (ref 0.57–1.00)
Glucose: 84 mg/dL (ref 65–99)
Potassium: 5.1 mmol/L (ref 3.5–5.2)
Sodium: 141 mmol/L (ref 134–144)
eGFR: 77 mL/min/{1.73_m2} (ref >59)

## 2020-09-01 ENCOUNTER — Other Ambulatory Visit (HOSPITAL_COMMUNITY): Payer: Self-pay | Admitting: Family Medicine

## 2020-09-01 DIAGNOSIS — Z1231 Encounter for screening mammogram for malignant neoplasm of breast: Secondary | ICD-10-CM

## 2020-09-03 ENCOUNTER — Ambulatory Visit (INDEPENDENT_AMBULATORY_CARE_PROVIDER_SITE_OTHER): Payer: 59

## 2020-09-03 DIAGNOSIS — I5032 Chronic diastolic (congestive) heart failure: Secondary | ICD-10-CM

## 2020-09-04 LAB — CUP PACEART REMOTE DEVICE CHECK
Battery Impedance: 6587 Ohm
Battery Voltage: 2.63 V
Brady Statistic RV Percent Paced: 100 %
Date Time Interrogation Session: 20220527160903
Implantable Lead Implant Date: 20100201
Implantable Lead Implant Date: 20100201
Implantable Lead Location: 753859
Implantable Lead Location: 753860
Implantable Lead Model: 5076
Implantable Lead Model: 5076
Implantable Pulse Generator Implant Date: 20100201
Lead Channel Impedance Value: 427 Ohm
Lead Channel Setting Pacing Amplitude: 2.5 V
Lead Channel Setting Pacing Pulse Width: 0.4 ms
Lead Channel Setting Sensing Sensitivity: 2 mV

## 2020-09-07 ENCOUNTER — Telehealth: Payer: Self-pay | Admitting: Emergency Medicine

## 2020-09-07 NOTE — Telephone Encounter (Signed)
  Alert received on 09/03/20.The battery has reached the elective replacement indicator on 08/15/2020. Spoke with patient and advised ERI status. Patient has an appointment with Dr. Graciela Husbands on 09/21/20.

## 2020-09-08 ENCOUNTER — Ambulatory Visit (HOSPITAL_COMMUNITY)
Admission: RE | Admit: 2020-09-08 | Discharge: 2020-09-08 | Disposition: A | Discharge status: Home / Self Care | Payer: 59 | Source: Ambulatory Visit | Attending: Family Medicine | Admitting: Family Medicine

## 2020-09-08 DIAGNOSIS — Z1231 Encounter for screening mammogram for malignant neoplasm of breast: Secondary | ICD-10-CM | POA: Insufficient documentation

## 2020-09-08 NOTE — Telephone Encounter (Signed)
And look at the HR histogram  something got crazy with rate sensor

## 2020-09-17 NOTE — Addendum Note (Signed)
Addended by: Geralyn Flash D on: 09/17/2020 02:59 PM   Modules accepted: Level of Service

## 2020-09-17 NOTE — Progress Notes (Signed)
Remote pacemaker transmission.   

## 2020-09-21 ENCOUNTER — Other Ambulatory Visit: Payer: Self-pay

## 2020-09-21 ENCOUNTER — Encounter: Payer: Self-pay | Admitting: Internal Medicine

## 2020-09-21 ENCOUNTER — Ambulatory Visit (INDEPENDENT_AMBULATORY_CARE_PROVIDER_SITE_OTHER): Payer: 59 | Admitting: Internal Medicine

## 2020-09-21 VITALS — BP 100/66 | HR 65 | Ht 65.0 in | Wt 253.0 lb

## 2020-09-21 DIAGNOSIS — Z950 Presence of cardiac pacemaker: Secondary | ICD-10-CM

## 2020-09-21 DIAGNOSIS — I442 Atrioventricular block, complete: Secondary | ICD-10-CM | POA: Diagnosis not present

## 2020-09-21 DIAGNOSIS — I471 Supraventricular tachycardia: Secondary | ICD-10-CM

## 2020-09-21 DIAGNOSIS — I5032 Chronic diastolic (congestive) heart failure: Secondary | ICD-10-CM | POA: Diagnosis not present

## 2020-09-21 DIAGNOSIS — I480 Paroxysmal atrial fibrillation: Secondary | ICD-10-CM

## 2020-09-21 DIAGNOSIS — Z01818 Encounter for other preprocedural examination: Secondary | ICD-10-CM | POA: Diagnosis not present

## 2020-09-21 DIAGNOSIS — Z01812 Encounter for preprocedural laboratory examination: Secondary | ICD-10-CM

## 2020-09-21 DIAGNOSIS — I428 Other cardiomyopathies: Secondary | ICD-10-CM

## 2020-09-21 DIAGNOSIS — R55 Syncope and collapse: Secondary | ICD-10-CM

## 2020-09-21 NOTE — H&P (View-Only) (Signed)
Patient ID: [name] [name], female   DOB: 08-22-1960, 60 y.o.   MRN: [id number]       Patient Care Team: Estanislado Pandy, MD as PCP - General (Cardiology) [name], MD (Cardiology) Hillis Range, MD (Cardiology)   HPI  [name] [name] is a 60 y.o. 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 She underwent generator replacement in April of 2008.   Echocardiogram demonstrated mitral regurgitation prompting TEE and MRI which demonstrated MR that was only moderate.  There have been interval deterioration of LV systolic function  Lengthy discussions 12/21 regarding CRT upgrade.  In the midst of the COVID uptake, she was reluctant to undergo that.  She was started on Entresto.  Her diuretics were uptitrated. She is significantly better but still limited  Device History: MDT dual chamber PPM implanted 1995 for complete heart block; gen change 2009; extraction 2010 2/2 infection with reimplantation on right side She has now reached ERI     The patient denies chest pain, nocturnal dyspnea, or peripheral edema.  There have been no palpitations, lightheadedness or syncope.  Complains of DOE and 3 pillow orthopnea  Denies OSA, snores some according to her husband but without day time somnolence and awakens refreshed .  DATE TEST EF    10/15 Echo   55 % MR mod-severe  12/15 TEE  55 % MR 2 jets, mild-mod  4/18 LHC 35-45% CA normal  1/20 Echo 40-45% MR md  2/20 TEE 45-50 MVP mild/MR mod  10/20 Echo (CE) 40% MR mild-mod  10/21 TEE EF 45-50% MR  mod LA emptying vel 60 cm/sec    DATE CR K HGB  11/18 0.63     10/20 0.65(2/20) 0.57 (2/20) 4.4    05/22 0.87 5.1           Past Medical History:  Diagnosis Date   Anemia    Atrial fibrillation (HCC)    Chronic diastolic heart failure (HCC)    EF 40-45%   Depression    history of depression in the past.   GERD (gastroesophageal reflux disease)     Mitral valvular regurgitation    Pacemaker- medtronic    Initial PM 1995; generator 2009, infection with contralateral implant 2010   S/P AV nodal ablation    Shortness of breath dyspnea     Past Surgical History:  Procedure Laterality Date   ABDOMINAL ADHESION SURGERY  04/2006   CARDIAC CATHETERIZATION  09/11/2007   CARDIAC PACEMAKER PLACEMENT  1995   [facility],Medtronic Adapta ADDRL1   CATARACT EXTRACTION, BILATERAL     GASTRIC BYPASS  09/2003   LEFT HEART CATH AND CORONARY ANGIOGRAPHY N/A 07/18/2016   Procedure: Left Heart Cath and Coronary Angiography;  Surgeon: Lennette Bihari, MD;  Location: [facility] INVASIVE CV LAB;  Service: Cardiovascular;  Laterality: N/A;   TEE WITHOUT CARDIOVERSION N/A 03/19/2014   Procedure: TRANSESOPHAGEAL ECHOCARDIOGRAM (TEE);  Surgeon: Antoine Poche, MD;  [facility];  Service: Endoscopy;  Laterality: N/A;   TEE WITHOUT CARDIOVERSION N/A 05/21/2018   Procedure: TRANSESOPHAGEAL ECHOCARDIOGRAM (TEE);  Surgeon: Antoine Poche, MD;  [facility];  Service: Endoscopy;  Laterality: N/A;   TEE WITHOUT CARDIOVERSION N/A 01/29/2020   Procedure: TRANSESOPHAGEAL ECHOCARDIOGRAM (TEE) WITH PROPOFOL;  Surgeon: Antoine Poche, MD;  [facility]: [facility];  Service: Endoscopy;  Laterality: N/A;  scheduled by [employer] @ Dr. Wilmon Arms office  Current Outpatient Medications  Medication Sig Dispense Refill   ARIPiprazole (ABILIFY) 10 MG tablet Take 10 mg by mouth at bedtime.      aspirin EC 81 MG tablet Take 81 mg by mouth daily.     carvedilol (COREG) 3.125 MG tablet Take 6.25 mg by mouth 2 (two) times daily.  6   chlorhexidine (PERIDEX) 0.12 % solution 15 mLs by Mouth Rinse route 2 (two) times daily. As needed     DULoxetine (CYMBALTA) 20 MG capsule Take 20 mg by mouth 2 (two) times daily.     furosemide (LASIX) 40 MG tablet Take 80 mg by mouth daily.     Melatonin 10 MG TABS Take 20 mg by mouth at bedtime.      omeprazole  (PRILOSEC) 20 MG capsule Take 20 mg by mouth daily before breakfast.      sacubitril-valsartan (ENTRESTO) 24-26 MG Take 1 tablet by mouth 2 (two) times daily. 180 tablet 1   spironolactone (ALDACTONE) 25 MG tablet Take 0.5 tablets (12.5 mg total) by mouth daily. 45 tablet 3   traZODone (DESYREL) 100 MG tablet Take 200 mg by mouth at bedtime.      No current facility-administered medications for this visit.    No Known Allergies  Review of Systems negative except from HPI and PMH  Physical Exam BP 100/66   Pulse 65   Ht 5\' 5"  (1.651 m)   Wt 253 lb (114.8 kg)   SpO2 97%   BMI 42.10 kg/m  Well developed and well nourished in no acute distress HENT normal Neck supple with JVP-flat Clear Device pocket well healed; without hematoma or erythema.  There is no tethering  Regular rate and rhythm, no  murmur Abd-soft with active BS No Clubbing cyanosis  edema Skin-warm and dry A & Oriented  Grossly normal sensory and motor function  ECG ventricular pacing at 65 with underlying atrial flutter Intervals-/18/51 Axis left -42    Assessment and  Plan  Complete heart block status post AV junction ablation    Cardiomyopathy-nonischemic ejection fraction 50-55 >> 35  HFpEF//HFrEF chronic  .   Pacemaker  medtronic        Atrial tachy    Syncope  Mitral regurgitation-mild-moderate  Her device is reached ERI.  With interval worsening of LV function and mitral regurgitation, she has a presumed at least in part pacemaker cardiomyopathy.  She is device generator replacement.  We discussed upgrade which would be from the right side and left bundle branch.  Pacing the event that we are unable to deploy an LV lead.  We discussed risks and benefits including but not limited to infection lead fracture and inability to deploy.  We will plan to use antimicrobial pouch  She is much better on the [facility].  Following CRT there may be room for up titration of her meds.  She is also a candidate for  SGLT2.  We discussed this today we will plan initiation of Jardiance following device generator upgrade  She is euvolemic.  We will continue her on her Lasix 80 daily.  She has not anticoagulated.  This was stopped at [facility] years ago.  Her atrial tachycardia rate varies from about 550 to 400 ms.   Left atrial appendage emptying velocities are 60, this would suggest that her thromboembolic risk related to her arrhythmias is low and would not use anticoagulation.  We will also discontinue her aspirin            I, [facility]  Graciela Husbands, MD, have reviewed all documentation for this visit. The documentation on 09/21/20 for the exam, diagnosis, procedures, and orders are all accurate and complete.

## 2020-09-21 NOTE — Progress Notes (Signed)
Patient ID: [name] [name], female   DOB: 08-22-1960, 60 y.o.   MRN: [id number]       Patient Care Team: Estanislado Pandy, MD as PCP - General (Cardiology) [name], MD (Cardiology) Hillis Range, MD (Cardiology)   HPI  [name] [name] is a 60 y.o. 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 She underwent generator replacement in April of 2008.   Echocardiogram demonstrated mitral regurgitation prompting TEE and MRI which demonstrated MR that was only moderate.  There have been interval deterioration of LV systolic function  Lengthy discussions 12/21 regarding CRT upgrade.  In the midst of the COVID uptake, she was reluctant to undergo that.  She was started on Entresto.  Her diuretics were uptitrated. She is significantly better but still limited  Device History: MDT dual chamber PPM implanted 1995 for complete heart block; gen change 2009; extraction 2010 2/2 infection with reimplantation on right side She has now reached ERI     The patient denies chest pain, nocturnal dyspnea, or peripheral edema.  There have been no palpitations, lightheadedness or syncope.  Complains of DOE and 3 pillow orthopnea  Denies OSA, snores some according to her husband but without day time somnolence and awakens refreshed .  DATE TEST EF    10/15 Echo   55 % MR mod-severe  12/15 TEE  55 % MR 2 jets, mild-mod  4/18 LHC 35-45% CA normal  1/20 Echo 40-45% MR md  2/20 TEE 45-50 MVP mild/MR mod  10/20 Echo (CE) 40% MR mild-mod  10/21 TEE EF 45-50% MR  mod LA emptying vel 60 cm/sec    DATE CR K HGB  11/18 0.63     10/20 0.65(2/20) 0.57 (2/20) 4.4    05/22 0.87 5.1           Past Medical History:  Diagnosis Date   Anemia    Atrial fibrillation (HCC)    Chronic diastolic heart failure (HCC)    EF 40-45%   Depression    history of depression in the past.   GERD (gastroesophageal reflux disease)     Mitral valvular regurgitation    Pacemaker- medtronic    Initial PM 1995; generator 2009, infection with contralateral implant 2010   S/P AV nodal ablation    Shortness of breath dyspnea     Past Surgical History:  Procedure Laterality Date   ABDOMINAL ADHESION SURGERY  04/2006   CARDIAC CATHETERIZATION  09/11/2007   CARDIAC PACEMAKER PLACEMENT  1995   [facility],Medtronic Adapta ADDRL1   CATARACT EXTRACTION, BILATERAL     GASTRIC BYPASS  09/2003   LEFT HEART CATH AND CORONARY ANGIOGRAPHY N/A 07/18/2016   Procedure: Left Heart Cath and Coronary Angiography;  Surgeon: Lennette Bihari, MD;  [facility];  Service: Cardiovascular;  Laterality: N/A;   TEE WITHOUT CARDIOVERSION N/A 03/19/2014   Procedure: TRANSESOPHAGEAL ECHOCARDIOGRAM (TEE);  Surgeon: Antoine Poche, MD;  [facility]: [facility];  Service: Endoscopy;  Laterality: N/A;   TEE WITHOUT CARDIOVERSION N/A 05/21/2018   Procedure: TRANSESOPHAGEAL ECHOCARDIOGRAM (TEE);  Surgeon: Antoine Poche, MD;  [facility]: [facility];  Service: Endoscopy;  Laterality: N/A;   TEE WITHOUT CARDIOVERSION N/A 01/29/2020   Procedure: TRANSESOPHAGEAL ECHOCARDIOGRAM (TEE) WITH PROPOFOL;  Surgeon: Antoine Poche, MD;  [facility]: [facility];  Service: Endoscopy;  Laterality: N/A;  scheduled by [employer] @ Dr. Wilmon Arms office  Current Outpatient Medications  Medication Sig Dispense Refill   ARIPiprazole (ABILIFY) 10 MG tablet Take 10 mg by mouth at bedtime.      aspirin EC 81 MG tablet Take 81 mg by mouth daily.     carvedilol (COREG) 3.125 MG tablet Take 6.25 mg by mouth 2 (two) times daily.  6   chlorhexidine (PERIDEX) 0.12 % solution 15 mLs by Mouth Rinse route 2 (two) times daily. As needed     DULoxetine (CYMBALTA) 20 MG capsule Take 20 mg by mouth 2 (two) times daily.     furosemide (LASIX) 40 MG tablet Take 80 mg by mouth daily.     Melatonin 10 MG TABS Take 20 mg by mouth at bedtime.      omeprazole  (PRILOSEC) 20 MG capsule Take 20 mg by mouth daily before breakfast.      sacubitril-valsartan (ENTRESTO) 24-26 MG Take 1 tablet by mouth 2 (two) times daily. 180 tablet 1   spironolactone (ALDACTONE) 25 MG tablet Take 0.5 tablets (12.5 mg total) by mouth daily. 45 tablet 3   traZODone (DESYREL) 100 MG tablet Take 200 mg by mouth at bedtime.      No current facility-administered medications for this visit.    No Known Allergies  Review of Systems negative except from HPI and PMH  Physical Exam BP 100/66   Pulse 65   Ht 5\' 5"  (1.651 m)   Wt 253 lb (114.8 kg)   SpO2 97%   BMI 42.10 kg/m  Well developed and well nourished in no acute distress HENT normal Neck supple with JVP-flat Clear Device pocket well healed; without hematoma or erythema.  There is no tethering  Regular rate and rhythm, no  murmur Abd-soft with active BS No Clubbing cyanosis  edema Skin-warm and dry A & Oriented  Grossly normal sensory and motor function  ECG ventricular pacing at 65 with underlying atrial flutter Intervals-/18/51 Axis left -42    Assessment and  Plan  Complete heart block status post AV junction ablation    Cardiomyopathy-nonischemic ejection fraction 50-55 >> 35  HFpEF//HFrEF chronic  .   Pacemaker  medtronic        Atrial tachy    Syncope  Mitral regurgitation-mild-moderate  Her device is reached ERI.  With interval worsening of LV function and mitral regurgitation, she has a presumed at least in part pacemaker cardiomyopathy.  She is device generator replacement.  We discussed upgrade which would be from the right side and left bundle branch.  Pacing the event that we are unable to deploy an LV lead.  We discussed risks and benefits including but not limited to infection lead fracture and inability to deploy.  We will plan to use antimicrobial pouch  She is much better on the [facility].  Following CRT there may be room for up titration of her meds.  She is also a candidate for  SGLT2.  We discussed this today we will plan initiation of Jardiance following device generator upgrade  She is euvolemic.  We will continue her on her Lasix 80 daily.  She has not anticoagulated.  This was stopped at [facility] years ago.  Her atrial tachycardia rate varies from about 550 to 400 ms.   Left atrial appendage emptying velocities are 60, this would suggest that her thromboembolic risk related to her arrhythmias is low and would not use anticoagulation.  We will also discontinue her aspirin            I, [facility]  Graciela Husbands, MD, have reviewed all documentation for this visit. The documentation on 09/21/20 for the exam, diagnosis, procedures, and orders are all accurate and complete.

## 2020-09-21 NOTE — Patient Instructions (Signed)
Medication Instructions:  Your physician recommends that you continue on your current medications as directed. Please refer to the Current Medication list given to you today.  *If you need a refill on your cardiac medications before your next appointment, please call your pharmacy*   Lab Work: CBC and BMET today If you have labs (blood work) drawn today and your tests are completely normal, you will receive your results only by: MyChart Message (if you have MyChart) OR A paper copy in the mail If you have any lab test that is abnormal or we need to change your treatment, we will call you to review the results.   Testing/Procedures: Pacemaker - with CRT upgrade CRT or cardiac resynchronization therapy is a treatment used to correct heart failure. When you have heart failure your heart is weakened and doesn't pump as well as it should. This therapy may help reduce symptoms and improve the quality of life.  Please see the handout/brochure given to you today to get more information of the different options of therapy.     Follow-Up: At [facility], you and your health needs are our priority.  As part of our continuing mission to provide you with exceptional heart care, we have created designated Provider Care Teams.  These Care Teams include your primary Cardiologist (physician) and Advanced Practice Providers (APPs -  Physician Assistants and Nurse Practitioners) who all work together to provide you with the care you need, when you need it.  We recommend signing up for the patient portal called "MyChart".  Sign up information is provided on this After Visit Summary.  MyChart is used to connect with patients for Virtual Visits (Telemedicine).  Patients are able to view lab/test results, encounter notes, upcoming appointments, etc.  Non-urgent messages can be sent to your provider as well.   To learn more about what you can do with MyChart, go to [email or web address].    Your next  appointment:    Follow up to be scheduled

## 2020-09-22 LAB — BASIC METABOLIC PANEL
BUN/Creatinine Ratio: 20 (ref 9–23)
BUN: 17 mg/dL (ref 6–24)
CO2: 25 mmol/L (ref 20–29)
Calcium: 9 mg/dL (ref 8.7–10.2)
Chloride: 103 mmol/L (ref 96–106)
Creatinine, Ser: 0.85 mg/dL (ref 0.57–1.00)
Glucose: 90 mg/dL (ref 65–99)
Potassium: 4.8 mmol/L (ref 3.5–5.2)
Sodium: 141 mmol/L (ref 134–144)
eGFR: 79 mL/min/{1.73_m2} (ref >59)

## 2020-09-22 LAB — CBC
Hematocrit: 35 % (ref 34.0–46.6)
Hemoglobin: 10.9 g/dL — ABNORMAL LOW (ref 11.1–15.9)
MCH: 26.9 pg (ref 26.6–33.0)
MCHC: 31.1 g/dL — ABNORMAL LOW (ref 31.5–35.7)
MCV: 86 fL (ref 79–97)
Platelets: 241 10*3/uL (ref 150–450)
RBC: 4.05 x10E6/uL (ref 3.77–5.28)
RDW: 14.5 % (ref 11.7–15.4)
WBC: 5.6 10*3/uL (ref 3.4–10.8)

## 2020-09-28 ENCOUNTER — Telehealth: Payer: Self-pay

## 2020-09-28 NOTE — Telephone Encounter (Signed)
Spoke with pt's husband, DPR who reports pt unavailable due to having death in the family.  Pt's husband will let pt know arrival time to [facility] for generator change has been moved to 10am.  PT's husband is agreeable and thanked Charity fundraiser for the phone call.  RN will also send a MYChart message re: change.

## 2020-10-04 ENCOUNTER — Ambulatory Visit (INDEPENDENT_AMBULATORY_CARE_PROVIDER_SITE_OTHER): Payer: 59

## 2020-10-04 DIAGNOSIS — I428 Other cardiomyopathies: Secondary | ICD-10-CM

## 2020-10-04 LAB — CUP PACEART REMOTE DEVICE CHECK
Battery Impedance: 7429 Ohm
Battery Voltage: 2.61 V
Brady Statistic RV Percent Paced: 100 %
Date Time Interrogation Session: 20220627153014
Implantable Lead Implant Date: 20100201
Implantable Lead Implant Date: 20100201
Implantable Lead Location: 753859
Implantable Lead Location: 753860
Implantable Lead Model: 5076
Implantable Lead Model: 5076
Implantable Pulse Generator Implant Date: 20100201
Lead Channel Impedance Value: 447 Ohm
Lead Channel Impedance Value: 67 Ohm
Lead Channel Setting Pacing Amplitude: 2.5 V
Lead Channel Setting Pacing Pulse Width: 0.4 ms
Lead Channel Setting Sensing Sensitivity: 2 mV

## 2020-10-12 NOTE — Pre-Procedure Instructions (Signed)
Instructed patient on the following items: Arrival time 1130 Nothing to eat or drink after midnight No meds AM of procedure Responsible person to drive you home and stay with you for 24 hrs Wash with special soap night before and morning of procedure  

## 2020-10-13 ENCOUNTER — Ambulatory Visit (HOSPITAL_COMMUNITY)
Admission: RE | Admit: 2020-10-13 | Discharge: 2020-10-13 | Disposition: A | Discharge status: Home / Self Care | Payer: 59 | Attending: Internal Medicine | Admitting: Internal Medicine

## 2020-10-13 ENCOUNTER — Other Ambulatory Visit: Payer: Self-pay

## 2020-10-13 ENCOUNTER — Encounter (HOSPITAL_COMMUNITY)
Admission: RE | Disposition: A | Discharge status: Home / Self Care | Payer: Self-pay | Source: Home / Self Care | Attending: Internal Medicine

## 2020-10-13 ENCOUNTER — Ambulatory Visit (HOSPITAL_COMMUNITY): Payer: 59

## 2020-10-13 DIAGNOSIS — R55 Syncope and collapse: Secondary | ICD-10-CM | POA: Insufficient documentation

## 2020-10-13 DIAGNOSIS — Z959 Presence of cardiac and vascular implant and graft, unspecified: Secondary | ICD-10-CM

## 2020-10-13 DIAGNOSIS — I340 Nonrheumatic mitral (valve) insufficiency: Secondary | ICD-10-CM | POA: Diagnosis not present

## 2020-10-13 DIAGNOSIS — Z45010 Encounter for checking and testing of cardiac pacemaker pulse generator [battery]: Secondary | ICD-10-CM | POA: Insufficient documentation

## 2020-10-13 DIAGNOSIS — I442 Atrioventricular block, complete: Secondary | ICD-10-CM | POA: Insufficient documentation

## 2020-10-13 DIAGNOSIS — I4891 Unspecified atrial fibrillation: Secondary | ICD-10-CM | POA: Diagnosis not present

## 2020-10-13 DIAGNOSIS — Z7982 Long term (current) use of aspirin: Secondary | ICD-10-CM | POA: Diagnosis not present

## 2020-10-13 DIAGNOSIS — I5032 Chronic diastolic (congestive) heart failure: Secondary | ICD-10-CM | POA: Diagnosis not present

## 2020-10-13 DIAGNOSIS — I428 Other cardiomyopathies: Secondary | ICD-10-CM | POA: Diagnosis not present

## 2020-10-13 DIAGNOSIS — Z79899 Other long term (current) drug therapy: Secondary | ICD-10-CM | POA: Insufficient documentation

## 2020-10-13 DIAGNOSIS — Z9884 Bariatric surgery status: Secondary | ICD-10-CM | POA: Insufficient documentation

## 2020-10-13 HISTORY — PX: BIV UPGRADE: EP1202

## 2020-10-13 SURGERY — BIV UPGRADE

## 2020-10-13 MED ORDER — FENTANYL CITRATE (PF) 100 MCG/2ML IJ SOLN
INTRAMUSCULAR | Status: AC
  Filled 2020-10-13: qty 2

## 2020-10-13 MED ORDER — SODIUM CHLORIDE 0.9 % IV SOLN: INTRAVENOUS | Status: DC

## 2020-10-13 MED ORDER — POVIDONE-IODINE 10 % EX SWAB: 2.0000 "application " | Freq: Once | CUTANEOUS | Status: DC

## 2020-10-13 MED ORDER — CEFAZOLIN SODIUM-DEXTROSE 2-4 GM/100ML-% IV SOLN
2.0000 g | INTRAVENOUS | Status: AC
  Administered 2020-10-13: 2 g via INTRAVENOUS
  Filled 2020-10-13: qty 100

## 2020-10-13 MED ORDER — FENTANYL CITRATE (PF) 100 MCG/2ML IJ SOLN
INTRAMUSCULAR | Status: DC | PRN
  Administered 2020-10-13 (×5): 25 ug via INTRAVENOUS

## 2020-10-13 MED ORDER — ONDANSETRON HCL 4 MG/2ML IJ SOLN: 4.0000 mg | Freq: Four times a day (QID) | INTRAMUSCULAR | Status: DC | PRN

## 2020-10-13 MED ORDER — ACETAMINOPHEN 325 MG PO TABS: 325.0000 mg | ORAL_TABLET | ORAL | Status: DC | PRN

## 2020-10-13 MED ORDER — MIDAZOLAM HCL 5 MG/5ML IJ SOLN
INTRAMUSCULAR | Status: AC
  Filled 2020-10-13: qty 5

## 2020-10-13 MED ORDER — LIDOCAINE HCL (PF) 1 % IJ SOLN
INTRAMUSCULAR | Status: AC
  Filled 2020-10-13: qty 60

## 2020-10-13 MED ORDER — IOHEXOL 350 MG/ML SOLN
INTRAVENOUS | Status: DC | PRN
  Administered 2020-10-13: 15 mL

## 2020-10-13 MED ORDER — SODIUM CHLORIDE 0.9 % IV SOLN
80.0000 mg | INTRAVENOUS | Status: AC
  Administered 2020-10-13: 80 mg
  Filled 2020-10-13: qty 2

## 2020-10-13 MED ORDER — HEPARIN (PORCINE) IN NACL 1000-0.9 UT/500ML-% IV SOLN
INTRAVENOUS | Status: AC
  Filled 2020-10-13: qty 500

## 2020-10-13 MED ORDER — MIDAZOLAM HCL 5 MG/5ML IJ SOLN
INTRAMUSCULAR | Status: DC | PRN
  Administered 2020-10-13 (×3): 1 mg via INTRAVENOUS
  Administered 2020-10-13: 2 mg via INTRAVENOUS
  Administered 2020-10-13: 1 mg via INTRAVENOUS

## 2020-10-13 MED ORDER — CHLORHEXIDINE GLUCONATE 4 % EX LIQD
4.0000 "application " | Freq: Once | CUTANEOUS | Status: DC
  Filled 2020-10-13: qty 60

## 2020-10-13 MED ORDER — LIDOCAINE HCL (PF) 1 % IJ SOLN
INTRAMUSCULAR | Status: DC | PRN
  Administered 2020-10-13: 60 mL

## 2020-10-13 MED ORDER — HEPARIN (PORCINE) IN NACL 1000-0.9 UT/500ML-% IV SOLN
INTRAVENOUS | Status: DC | PRN
  Administered 2020-10-13: 500 mL

## 2020-10-13 MED ORDER — CEFAZOLIN SODIUM-DEXTROSE 2-4 GM/100ML-% IV SOLN
INTRAVENOUS | Status: AC
  Filled 2020-10-13: qty 100

## 2020-10-13 MED ORDER — SODIUM CHLORIDE 0.9 % IV SOLN
INTRAVENOUS | Status: AC
  Filled 2020-10-13: qty 2

## 2020-10-13 SURGICAL SUPPLY — 14 items
CABLE SURGICAL S-101-97-12 (CABLE) ×2 IMPLANT
CATH CPS DIRECT RT DS2C023 (CATHETERS) ×2 IMPLANT
DEVICE CRTP PERCEPTA QUAD MRI (Pacemaker) ×2 IMPLANT
HEMOSTAT SURGICEL 2X4 FIBR (HEMOSTASIS) ×2 IMPLANT
LEAD ATTAIN PERFORMA S 4598-88 (Lead) ×2 IMPLANT
MAT PREVALON FULL STRYKER (MISCELLANEOUS) ×2 IMPLANT
PAD PRO RADIOLUCENT 2001M-C (PAD) ×2 IMPLANT
POUCH AIGIS-R ANTIBACT ICD (Mesh General) ×2 IMPLANT
SET INTRODUCER MICROPUNCT 5F (INTRODUCER) ×2 IMPLANT
SHEATH 9.5FR PRELUDE SNAP 13 (SHEATH) ×2 IMPLANT
SLITTER UNIVERSAL DS2A003 (MISCELLANEOUS) ×2 IMPLANT
TRAY PACEMAKER INSERTION (PACKS) ×2 IMPLANT
WIRE ACUITY WHISPER EDS 4648 (WIRE) ×2 IMPLANT
WIRE HI TORQ VERSACORE-J 145CM (WIRE) ×2 IMPLANT

## 2020-10-13 NOTE — Progress Notes (Signed)
Per Dr. Graciela Husbands, if chest xray says no pneumothorax or abnormal reading, patient is ok to discharge.

## 2020-10-13 NOTE — Progress Notes (Signed)
Ok to go with IV on the right per Michela Pitcher

## 2020-10-13 NOTE — Discharge Instructions (Addendum)
    Supplemental Discharge Instructions for  Pacemaker/Defibrillator Patients  Tomorrow, 10/14/20, send in a device transmission  Activity No heavy lifting or vigorous activity with your left/right arm for 6 to 8 weeks.  Do not raise your left/right arm above your head for one week.  Gradually raise your affected arm as drawn below.             10/18/20                      10/19/20                   10/20/20                    10/21/20 __  NO DRIVING for   1 week  ; you may begin driving on   2/77/82  .  WOUND CARE Keep the wound area clean and dry.  Do not get this area wet , no showers for one week; you may shower on     . Tomorrow, 10/14/20, remove the arm sling Dr. Graciela Husbands used DERMABOND to close your wound.  DO NOT peel this off.  Do not rub/scrub the area, pat dry. No bandage is needed on the site.  DO  NOT apply any creams, oils, or ointments to the wound area. If you notice any drainage or discharge from the wound, any swelling or bruising at the site, or you develop a fever > 101? F after you are discharged home, call the office at once.  Special Instructions You are still able to use cellular telephones; use the ear opposite the side where you have your pacemaker/defibrillator.  Avoid carrying your cellular phone near your device. When traveling through airports, show security personnel your identification card to avoid being screened in the metal detectors.  Ask the security personnel to use the hand wand. Avoid arc welding equipment, MRI testing (magnetic resonance imaging), TENS units (transcutaneous nerve stimulators).  Call the office for questions about other devices. Avoid electrical appliances that are in poor condition or are not properly grounded. Microwave ovens are safe to be near or to operate.

## 2020-10-14 ENCOUNTER — Encounter (HOSPITAL_COMMUNITY): Payer: Self-pay | Admitting: Internal Medicine

## 2020-10-17 NOTE — Interval H&P Note (Signed)
History and Physical Interval Note:  10/17/2020 2:35 PM  [name] [name]  has presented today for surgery, with the diagnosis of eri.  The various methods of treatment have been discussed with the patient and family. After consideration of risks, benefits and other options for treatment, the patient has consented to  Procedure(s): BIV UPGRADE (N/A) as a surgical intervention.  The patient's history has been reviewed, patient examined, no change in status, stable for surgery.  I have reviewed the patient's chart and labs.  Questions were answered to the patient's satisfaction.     Sherryl Manges

## 2020-10-20 NOTE — Progress Notes (Signed)
Remote pacemaker transmission.   

## 2020-10-27 ENCOUNTER — Other Ambulatory Visit: Payer: Self-pay

## 2020-10-27 ENCOUNTER — Ambulatory Visit (INDEPENDENT_AMBULATORY_CARE_PROVIDER_SITE_OTHER): Payer: 59

## 2020-10-27 DIAGNOSIS — I428 Other cardiomyopathies: Secondary | ICD-10-CM | POA: Diagnosis not present

## 2020-10-27 LAB — CUP PACEART INCLINIC DEVICE CHECK
Battery Remaining Longevity: 129 mo
Battery Voltage: 3.2 V
Brady Statistic AP VP Percent: 44.04 %
Brady Statistic AP VS Percent: 0 %
Brady Statistic AS VP Percent: 55.93 %
Brady Statistic AS VS Percent: 0.02 %
Brady Statistic RA Percent Paced: 47.63 %
Brady Statistic RV Percent Paced: 99.98 %
Date Time Interrogation Session: 20220720151746
Implantable Lead Implant Date: 20100201
Implantable Lead Implant Date: 20100201
Implantable Lead Implant Date: 20220706
Implantable Lead Location: 753858
Implantable Lead Location: 753859
Implantable Lead Location: 753860
Implantable Lead Model: 4598
Implantable Lead Model: 5076
Implantable Lead Model: 5076
Implantable Pulse Generator Implant Date: 20220706
Lead Channel Impedance Value: 323 Ohm
Lead Channel Impedance Value: 323 Ohm
Lead Channel Impedance Value: 380 Ohm
Lead Channel Impedance Value: 380 Ohm
Lead Channel Impedance Value: 399 Ohm
Lead Channel Impedance Value: 399 Ohm
Lead Channel Impedance Value: 418 Ohm
Lead Channel Impedance Value: 437 Ohm
Lead Channel Impedance Value: 551 Ohm
Lead Channel Impedance Value: 646 Ohm
Lead Channel Impedance Value: 684 Ohm
Lead Channel Impedance Value: 817 Ohm
Lead Channel Impedance Value: 817 Ohm
Lead Channel Impedance Value: 893 Ohm
Lead Channel Pacing Threshold Amplitude: 1 V
Lead Channel Pacing Threshold Amplitude: 1 V
Lead Channel Pacing Threshold Pulse Width: 0.4 ms
Lead Channel Pacing Threshold Pulse Width: 0.4 ms
Lead Channel Sensing Intrinsic Amplitude: 2.25 mV
Lead Channel Sensing Intrinsic Amplitude: 3.375 mV
Lead Channel Setting Pacing Amplitude: 2 V
Lead Channel Setting Pacing Amplitude: 2 V
Lead Channel Setting Pacing Amplitude: 2 V
Lead Channel Setting Pacing Pulse Width: 0.4 ms
Lead Channel Setting Pacing Pulse Width: 0.4 ms
Lead Channel Setting Sensing Sensitivity: 0.9 mV

## 2020-10-27 NOTE — Progress Notes (Signed)
Wound check appointment. Dermabond removed prior to visit. Wound without redness or edema. Incision edges approximated, wound well healed. Normal device function. RV/LV Thresholds, sensing, and impedances consistent with implant measurements. Unable to test RA threshold due to presence of AT.  Device programmed at appropriate safety margin for chronic leads. Histogram distribution appropriate for patient and level of activity. Patient is BiV pacing 100%.  AT/Af Burden 4%, pt has known history of AT, current episodes with Atrial rates peaking at 222, ventricular rates overall controlled by VP. Patient educated about wound care, arm mobility, lifting restrictions. Patient is enrolled in remote monitoring, next scheduled check 01/13/21.  ROV with DGraciela Husbands 02/01/21.

## 2020-10-27 NOTE — Patient Instructions (Signed)
   After Your Pacemaker   Monitor your pacemaker site for redness, swelling, and drainage. Call the device clinic at [phone] if you experience these symptoms or fever/chills.  Your incision was closed with Dermabond:  You may shower 1 day after your defibrillator implant and wash your incision with soap and water. Avoid lotions, ointments, or perfumes over your incision until it is well-healed.  You may use a hot tub or a pool after your wound check appointment if the incision is completely closed.  Do not lift, push or pull greater than 10 pounds with the affected arm until 6 weeks after your procedure. There are no other restrictions in arm movement after your wound check appointment.  You may drive, unless driving has been restricted by your healthcare providers.  Your Pacemaker is MRI compatible.  Remote monitoring is used to monitor your pacemaker from home. This monitoring is scheduled every 91 days by our office. It allows us to keep an eye on the functioning of your device to ensure it is working properly. You will routinely see your Electrophysiologist annually (more often if necessary).  

## 2020-11-09 ENCOUNTER — Encounter: Payer: 59 | Admitting: Internal Medicine

## 2020-11-10 ENCOUNTER — Other Ambulatory Visit: Payer: Self-pay | Admitting: Internal Medicine

## 2020-11-25 ENCOUNTER — Other Ambulatory Visit: Payer: Self-pay | Admitting: Internal Medicine

## 2020-11-30 ENCOUNTER — Ambulatory Visit (INDEPENDENT_AMBULATORY_CARE_PROVIDER_SITE_OTHER): Payer: 59 | Admitting: General Surgery

## 2020-11-30 ENCOUNTER — Encounter: Payer: Self-pay | Admitting: General Surgery

## 2020-11-30 ENCOUNTER — Other Ambulatory Visit: Payer: Self-pay

## 2020-11-30 VITALS — BP 112/76 | HR 85 | Temp 98.2°F | Resp 18 | Ht 65.0 in | Wt 265.0 lb

## 2020-11-30 DIAGNOSIS — K219 Gastro-esophageal reflux disease without esophagitis: Secondary | ICD-10-CM

## 2020-11-30 DIAGNOSIS — Z1211 Encounter for screening for malignant neoplasm of colon: Secondary | ICD-10-CM

## 2020-11-30 DIAGNOSIS — R195 Other fecal abnormalities: Secondary | ICD-10-CM

## 2020-11-30 MED ORDER — SUTAB 1479-225-188 MG PO TABS: 1.0000 | ORAL_TABLET | Freq: Once | ORAL | 0 refills | Status: AC

## 2020-12-01 NOTE — Progress Notes (Signed)
[name] [name]; [id number]; 03/26/1961   HPI Patient is a 60 year old white female who was referred to my care by Dr. Fara Chute for endoscopic evaluation of anemia and blood in her stools.  Patient states she has not noticed blood in her stools, but this was found on Hemoccult testing.  She was also told that she had mild anemia.  She has a longstanding history of GERD well controlled with a PPI.  She has never had an upper endoscopy.  She has a longstanding history of dysrhythmia requiring pacemaker placement and chronic diastolic heart failure.  She denies a family h/o colon cancer.  Past Medical History:  Diagnosis Date   Anemia    Atrial fibrillation (HCC)    Chronic diastolic heart failure (HCC)    EF 40-45%   Depression    history of depression in the past.   GERD (gastroesophageal reflux disease)    Mitral valvular regurgitation    Pacemaker- medtronic    Initial PM 1995; generator 2009, infection with contralateral implant 2010   S/P AV nodal ablation    Shortness of breath dyspnea     Past Surgical History:  Procedure Laterality Date   ABDOMINAL ADHESION SURGERY  04/2006   BIV UPGRADE N/A 10/13/2020   Procedure: BIV UPGRADE;  Surgeon: [name], MD;  Location: [facility];  Service: Cardiovascular;  Laterality: N/A;   CARDIAC CATHETERIZATION  09/11/2007   CARDIAC PACEMAKER PLACEMENT  1995   [facility],Medtronic Adapta ADDRL1   CATARACT EXTRACTION, BILATERAL     GASTRIC BYPASS  09/2003   LEFT HEART CATH AND CORONARY ANGIOGRAPHY N/A 07/18/2016   Procedure: Left Heart Cath and Coronary Angiography;  Surgeon: Lennette Bihari, MD;  Location: [facility];  Service: Cardiovascular;  Laterality: N/A;   TEE WITHOUT CARDIOVERSION N/A 03/19/2014   Procedure: TRANSESOPHAGEAL ECHOCARDIOGRAM (TEE);  Surgeon: Antoine Poche, MD;  [facility];  Service: Endoscopy;  Laterality: N/A;   TEE WITHOUT CARDIOVERSION N/A 05/21/2018   Procedure:  TRANSESOPHAGEAL ECHOCARDIOGRAM (TEE);  Surgeon: Antoine Poche, MD;  [facility];  Service: Endoscopy;  Laterality: N/A;   TEE WITHOUT CARDIOVERSION N/A 01/29/2020   Procedure: TRANSESOPHAGEAL ECHOCARDIOGRAM (TEE) WITH PROPOFOL;  Surgeon: Antoine Poche, MD;  [facility]: [facility];  Service: Endoscopy;  Laterality: N/A;  scheduled by Mindi Junker @ Dr. Wilmon Arms office    Family History  Problem Relation Age of Onset   Diabetes Mother    Hypertension Mother    Stroke Mother    Heart failure Mother    Cancer - Prostate Father    Arthritis-Osteo Sister    Hypertension Sister    Diabetes Sister    CAD Sister    Arthritis Sister    Hypertension Sister    Arthritis-Osteo Sister    Atrial fibrillation Sister    Cancer Sister    Pneumonia Sister    Osteoporosis Sister    Hypertension Sister    Mitral valve prolapse Sister    Arthritis-Osteo Sister    Fibromyalgia Sister    Depression Sister    Hypertension Brother    CAD Brother    Heart attack Brother    Hypertension Brother    CAD Brother    Heart attack Brother     Current Outpatient Medications on File Prior to Visit  Medication Sig Dispense Refill   ARIPiprazole (ABILIFY) 10 MG tablet Take 10 mg by mouth at bedtime.      aspirin  EC 81 MG tablet Take 81 mg by mouth daily.     carvedilol (COREG) 3.125 MG tablet Take 6.25 mg by mouth 2 (two) times daily.  6   chlorhexidine (PERIDEX) 0.12 % solution 15 mLs by Mouth Rinse route 2 (two) times daily as needed (gum irritation).     DULoxetine (CYMBALTA) 20 MG capsule Take 20 mg by mouth 2 (two) times daily.     furosemide (LASIX) 40 MG tablet Take 80 mg by mouth daily.     Melatonin 10 MG TABS Take 20 mg by mouth at bedtime.      omeprazole (PRILOSEC) 20 MG capsule Take 20 mg by mouth daily before breakfast.      sacubitril-valsartan (ENTRESTO) 24-26 MG Take 1 tablet by mouth 2 (two) times daily. 180 tablet 1   spironolactone (ALDACTONE) 25 MG tablet Take 1/2  (one-half) tablet by mouth once daily 45 tablet 3   traZODone (DESYREL) 100 MG tablet Take 200 mg by mouth at bedtime.      No current facility-administered medications on file prior to visit.    No Known Allergies  Social History   Substance and Sexual Activity  Alcohol Use Not Currently   Alcohol/week: 7.0 standard drinks   Types: 7 Cans of beer per week    Social History   Tobacco Use  Smoking Status Never  Smokeless Tobacco Never    Review of Systems  Constitutional:  Positive for malaise/fatigue.  HENT: Negative.    Eyes: Negative.   Respiratory:  Positive for shortness of breath.   Cardiovascular: Negative.   Gastrointestinal: Negative.   Genitourinary:  Positive for frequency.  Musculoskeletal:  Positive for back pain, joint pain and neck pain.  Skin: Negative.   Neurological: Negative.   Endo/Heme/Allergies: Negative.   Psychiatric/Behavioral: Negative.     Objective   Vitals:   11/30/20 1408  BP: 112/76  Pulse: 85  Resp: 18  Temp: 98.2 F (36.8 C)  SpO2: 94%    Physical Exam Vitals reviewed.  Constitutional:      Appearance: Normal appearance. She is not ill-appearing.  HENT:     Head: Normocephalic and atraumatic.  Cardiovascular:     Rate and Rhythm: Normal rate and regular rhythm.     Heart sounds: Normal heart sounds. No murmur heard.   No friction rub. No gallop.  Pulmonary:     Effort: Pulmonary effort is normal. No respiratory distress.     Breath sounds: Normal breath sounds. No stridor. No wheezing, rhonchi or rales.  Abdominal:     General: Bowel sounds are normal. There is no distension.     Palpations: Abdomen is soft. There is no mass.     Tenderness: There is no abdominal tenderness. There is no guarding or rebound.     Hernia: No hernia is present.  Skin:    General: Skin is warm and dry.  Neurological:     Mental Status: She is alert and oriented to person, place, and time.   Cardiology notes reviewed.  Recent laboratory  test reviewed Assessment  Need for screening colonoscopy, anemia of unknown etiology, GERD, occult blood loss Plan  Patient will be scheduled for EGD/colonoscopy with propofol.  The risks and benefits of both procedures including bleeding, infection, and perforation were fully explained to the patient, who gave informed consent.  Soo tabs have been prescribed. 

## 2020-12-22 NOTE — Patient Instructions (Signed)
[name] [name]  12/22/2020     @PREFPERIOPPHARMACY @   Your procedure is scheduled on  12/28/2020.   Report to 12/30/2020 at  [phone]  A.M.   Call this number if you have problems the morning of surgery:  [phone]   Remember:  Follow the diet and prep instructions given to you by the office.    Take these medicines the morning of surgery with A SIP OF WATER              carvedilol, cymbalta, mobic(if needed), prilosec, entresto.     Do not wear jewelry, make-up or nail polish.  Do not wear lotions, powders, or perfumes, or deodorant.  Do not shave 48 hours prior to surgery.  Men may shave face and neck.  Do not bring valuables to the hospital.  [facility] is not responsible for any belongings or valuables.  Contacts, dentures or bridgework may not be worn into surgery.  Leave your suitcase in the car.  After surgery it may be brought to your room.  For patients admitted to the hospital, discharge time will be determined by your treatment team.  Patients discharged the day of surgery will not be allowed to drive home and must have someone with them for 24 hours.    Special instructions:   DO NOT smoke tobacco or vape for 24 hours before your procedure.  Please read over the following fact sheets that you were given. Anesthesia Post-op Instructions and Care and Recovery After Surgery      Upper Endoscopy, Adult, Care After This sheet gives you information about how to care for yourself after your procedure. Your health care provider may also give you more specific instructions. If you have problems or questions, contact your health care provider. What can I expect after the procedure? After the procedure, it is common to have: A sore throat. Mild stomach pain or discomfort. Bloating. Nausea. Follow these instructions at home:  Follow instructions from your health care provider about what to eat or drink after your procedure. Return to your normal  activities as told by your health care provider. Ask your health care provider what activities are safe for you. Take over-the-counter and prescription medicines only as told by your health care provider. If you were given a sedative during the procedure, it can affect you for several hours. Do not drive or operate machinery until your health care provider says that it is safe. Keep all follow-up visits as told by your health care provider. This is important. Contact a health care provider if you have: A sore throat that lasts longer than one day. Trouble swallowing. Get help right away if: You vomit blood or your vomit looks like coffee grounds. You have: A fever. Bloody, black, or tarry stools. A severe sore throat or you cannot swallow. Difficulty breathing. Severe pain in your chest or abdomen. Summary After the procedure, it is common to have a sore throat, mild stomach discomfort, bloating, and nausea. If you were given a sedative during the procedure, it can affect you for several hours. Do not drive or operate machinery until your health care provider says that it is safe. Follow instructions from your health care provider about what to eat or drink after your procedure. Return to your normal activities as told by your health care provider. This information is not intended to replace advice given to you by your health care provider. Make sure you  discuss any questions you have with your health care provider. Document Revised: 03/25/2019 Document Reviewed: 08/27/2017 Elsevier Patient Education  2022 [employer]. Colonoscopy, Adult, Care After This sheet gives you information about how to care for yourself after your procedure. Your health care provider may also give you more specific instructions. If you have problems or questions, contact your health care provider. What can I expect after the procedure? After the procedure, it is common to have: A small amount of blood in your  stool for 24 hours after the procedure. Some gas. Mild cramping or bloating of your abdomen. Follow these instructions at home: Eating and drinking  Drink enough fluid to keep your urine pale yellow. Follow instructions from your health care provider about eating or drinking restrictions. Resume your normal diet as instructed by your health care provider. Avoid heavy or fried foods that are hard to digest. Activity Rest as told by your health care provider. Avoid sitting for a long time without moving. Get up to take short walks every 1-2 hours. This is important to improve blood flow and breathing. Ask for help if you feel weak or unsteady. Return to your normal activities as told by your health care provider. Ask your health care provider what activities are safe for you. Managing cramping and bloating  Try walking around when you have cramps or feel bloated. Apply heat to your abdomen as told by your health care provider. Use the heat source that your health care provider recommends, such as a moist heat pack or a heating pad. Place a towel between your skin and the heat source. Leave the heat on for 20-30 minutes. Remove the heat if your skin turns bright red. This is especially important if you are unable to feel pain, heat, or cold. You may have a greater risk of getting burned. General instructions If you were given a sedative during the procedure, it can affect you for several hours. Do not drive or operate machinery until your health care provider says that it is safe. For the first 24 hours after the procedure: Do not sign important documents. Do not drink alcohol. Do your regular daily activities at a slower pace than normal. Eat soft foods that are easy to digest. Take over-the-counter and prescription medicines only as told by your health care provider. Keep all follow-up visits as told by your health care provider. This is important. Contact a health care provider if: You  have blood in your stool 2-3 days after the procedure. Get help right away if you have: More than a small spotting of blood in your stool. Large blood clots in your stool. Swelling of your abdomen. Nausea or vomiting. A fever. Increasing pain in your abdomen that is not relieved with medicine. Summary After the procedure, it is common to have a small amount of blood in your stool. You may also have mild cramping and bloating of your abdomen. If you were given a sedative during the procedure, it can affect you for several hours. Do not drive or operate machinery until your health care provider says that it is safe. Get help right away if you have a lot of blood in your stool, nausea or vomiting, a fever, or increased pain in your abdomen. This information is not intended to replace advice given to you by your health care provider. Make sure you discuss any questions you have with your health care provider. Document Revised: 03/21/2019 Document Reviewed: 10/21/2018 Elsevier Patient Education  2022 Elsevier  Inc. Monitored Anesthesia Care, Care After This sheet gives you information about how to care for yourself after your procedure. Your health care provider may also give you more specific instructions. If you have problems or questions, contact your health care provider. What can I expect after the procedure? After the procedure, it is common to have: Tiredness. Forgetfulness about what happened after the procedure. Impaired judgment for important decisions. Nausea or vomiting. Some difficulty with balance. Follow these instructions at home: For the time period you were told by your health care provider:   Rest as needed. Do not participate in activities where you could fall or become injured. Do not drive or use machinery. Do not drink alcohol. Do not take sleeping pills or medicines that cause drowsiness. Do not make important decisions or sign legal documents. Do not take care of  children on your own. Eating and drinking Follow the diet that is recommended by your health care provider. Drink enough fluid to keep your urine pale yellow. If you vomit: Drink water, juice, or soup when you can drink without vomiting. Make sure you have little or no nausea before eating solid foods. General instructions Have a responsible adult stay with you for the time you are told. It is important to have someone help care for you until you are awake and alert. Take over-the-counter and prescription medicines only as told by your health care provider. If you have sleep apnea, surgery and certain medicines can increase your risk for breathing problems. Follow instructions from your health care provider about wearing your sleep device: Anytime you are sleeping, including during daytime naps. While taking prescription pain medicines, sleeping medicines, or medicines that make you drowsy. Avoid smoking. Keep all follow-up visits as told by your health care provider. This is important. Contact a health care provider if: You keep feeling nauseous or you keep vomiting. You feel light-headed. You are still sleepy or having trouble with balance after 24 hours. You develop a rash. You have a fever. You have redness or swelling around the IV site. Get help right away if: You have trouble breathing. You have new-onset confusion at home. Summary For several hours after your procedure, you may feel tired. You may also be forgetful and have poor judgment. Have a responsible adult stay with you for the time you are told. It is important to have someone help care for you until you are awake and alert. Rest as told. Do not drive or operate machinery. Do not drink alcohol or take sleeping pills. Get help right away if you have trouble breathing, or if you suddenly become confused. This information is not intended to replace advice given to you by your health care provider. Make sure you discuss any  questions you have with your health care provider. Document Revised: 12/11/2019 Document Reviewed: 02/27/2019 Elsevier Patient Education  2022 [employer].

## 2020-12-24 ENCOUNTER — Other Ambulatory Visit: Payer: Self-pay

## 2020-12-24 ENCOUNTER — Encounter (HOSPITAL_COMMUNITY): Payer: Self-pay

## 2020-12-24 ENCOUNTER — Encounter (HOSPITAL_COMMUNITY)
Admission: RE | Admit: 2020-12-24 | Discharge: 2020-12-24 | Disposition: A | Discharge status: Home / Self Care | Payer: 59 | Source: Ambulatory Visit | Attending: General Surgery | Admitting: General Surgery

## 2020-12-24 DIAGNOSIS — Z01818 Encounter for other preprocedural examination: Secondary | ICD-10-CM | POA: Insufficient documentation

## 2020-12-24 HISTORY — DX: Cardiac arrhythmia, unspecified: I49.9

## 2020-12-24 LAB — CBC WITH DIFFERENTIAL/PLATELET
Abs Immature Granulocytes: 0.01 10*3/uL (ref 0.00–0.07)
Basophils Absolute: 0.1 10*3/uL (ref 0.0–0.1)
Basophils Relative: 1 %
Eosinophils Absolute: 0.3 10*3/uL (ref 0.0–0.5)
Eosinophils Relative: 5 %
HCT: 32.7 % — ABNORMAL LOW (ref 36.0–46.0)
Hemoglobin: 10 g/dL — ABNORMAL LOW (ref 12.0–15.0)
Immature Granulocytes: 0 %
Lymphocytes Relative: 36 %
Lymphs Abs: 2 10*3/uL (ref 0.7–4.0)
MCH: 27 pg (ref 26.0–34.0)
MCHC: 30.6 g/dL (ref 30.0–36.0)
MCV: 88.4 fL (ref 80.0–100.0)
Monocytes Absolute: 0.5 10*3/uL (ref 0.1–1.0)
Monocytes Relative: 9 %
Neutro Abs: 2.7 10*3/uL (ref 1.7–7.7)
Neutrophils Relative %: 49 %
Platelets: 220 10*3/uL (ref 150–400)
RBC: 3.7 MIL/uL — ABNORMAL LOW (ref 3.87–5.11)
RDW: 18.4 % — ABNORMAL HIGH (ref 11.5–15.5)
WBC: 5.5 10*3/uL (ref 4.0–10.5)
nRBC: 0 % (ref 0.0–0.2)

## 2020-12-24 LAB — BASIC METABOLIC PANEL
Anion gap: 6 (ref 5–15)
BUN: 16 mg/dL (ref 6–20)
CO2: 26 mmol/L (ref 22–32)
Calcium: 7.9 mg/dL — ABNORMAL LOW (ref 8.9–10.3)
Chloride: 102 mmol/L (ref 98–111)
Creatinine, Ser: 0.81 mg/dL (ref 0.44–1.00)
GFR, Estimated: >60 mL/min (ref >60)
Glucose, Bld: 94 mg/dL (ref 70–99)
Potassium: 4.3 mmol/L (ref 3.5–5.1)
Sodium: 134 mmol/L — ABNORMAL LOW (ref 135–145)

## 2020-12-27 NOTE — H&P (Signed)
[name] [name]; [id number]; 03/26/1961   HPI Patient is a 60 year old white female who was referred to my care by Dr. Fara Chute for endoscopic evaluation of anemia and blood in her stools.  Patient states she has not noticed blood in her stools, but this was found on Hemoccult testing.  She was also told that she had mild anemia.  She has a longstanding history of GERD well controlled with a PPI.  She has never had an upper endoscopy.  She has a longstanding history of dysrhythmia requiring pacemaker placement and chronic diastolic heart failure.  She denies a family h/o colon cancer.  Past Medical History:  Diagnosis Date   Anemia    Atrial fibrillation (HCC)    Chronic diastolic heart failure (HCC)    EF 40-45%   Depression    history of depression in the past.   GERD (gastroesophageal reflux disease)    Mitral valvular regurgitation    Pacemaker- medtronic    Initial PM 1995; generator 2009, infection with contralateral implant 2010   S/P AV nodal ablation    Shortness of breath dyspnea     Past Surgical History:  Procedure Laterality Date   ABDOMINAL ADHESION SURGERY  04/2006   BIV UPGRADE N/A 10/13/2020   Procedure: BIV UPGRADE;  Surgeon: [name], MD;  Location: [facility]AS[facility];  Service: Cardiovascular;  Laterality: N/A;   CARDIAC CATHETERIZATION  09/11/2007   CARDIAC PACEMAKER PLACEMENT  1995   [facility],Medtronic Adapta ADDRL1   CATARACT EXTRACTION, BILATERAL     GASTRIC BYPASS  09/2003   LEFT HEART CATH AND CORONARY ANGIOGRAPHY N/A 07/18/2016   Procedure: Left Heart Cath and Coronary Angiography;  Surgeon: Lennette Bihari, MD;  Location: [facility] CV [facility];  Service: Cardiovascular;  Laterality: N/A;   TEE WITHOUT CARDIOVERSION N/A 03/19/2014   Procedure: TRANSESOPHAGEAL ECHOCARDIOGRAM (TEE);  Surgeon: Antoine Poche, MD;  Location: [facility];  Service: Endoscopy;  Laterality: N/A;   TEE WITHOUT CARDIOVERSION N/A 05/21/2018   Procedure:  TRANSESOPHAGEAL ECHOCARDIOGRAM (TEE);  Surgeon: Antoine Poche, MD;  Location: [facility];  Service: Endoscopy;  Laterality: N/A;   TEE WITHOUT CARDIOVERSION N/A 01/29/2020   Procedure: TRANSESOPHAGEAL ECHOCARDIOGRAM (TEE) WITH PROPOFOL;  Surgeon: Antoine Poche, MD;  Location: [facility];  Service: Endoscopy;  Laterality: N/A;  scheduled by Mindi Junker @ Dr. Wilmon Arms office    Family History  Problem Relation Age of Onset   Diabetes Mother    Hypertension Mother    Stroke Mother    Heart failure Mother    Cancer - Prostate Father    Arthritis-Osteo Sister    Hypertension Sister    Diabetes Sister    CAD Sister    Arthritis Sister    Hypertension Sister    Arthritis-Osteo Sister    Atrial fibrillation Sister    Cancer Sister    Pneumonia Sister    Osteoporosis Sister    Hypertension Sister    Mitral valve prolapse Sister    Arthritis-Osteo Sister    Fibromyalgia Sister    Depression Sister    Hypertension Brother    CAD Brother    Heart attack Brother    Hypertension Brother    CAD Brother    Heart attack Brother     Current Outpatient Medications on File Prior to Visit  Medication Sig Dispense Refill   ARIPiprazole (ABILIFY) 10 MG tablet Take 10 mg by mouth at bedtime.      aspirin  EC 81 MG tablet Take 81 mg by mouth daily.     carvedilol (COREG) 3.125 MG tablet Take 6.25 mg by mouth 2 (two) times daily.  6   chlorhexidine (PERIDEX) 0.12 % solution 15 mLs by Mouth Rinse route 2 (two) times daily as needed (gum irritation).     DULoxetine (CYMBALTA) 20 MG capsule Take 20 mg by mouth 2 (two) times daily.     furosemide (LASIX) 40 MG tablet Take 80 mg by mouth daily.     Melatonin 10 MG TABS Take 20 mg by mouth at bedtime.      omeprazole (PRILOSEC) 20 MG capsule Take 20 mg by mouth daily before breakfast.      sacubitril-valsartan (ENTRESTO) 24-26 MG Take 1 tablet by mouth 2 (two) times daily. 180 tablet 1   spironolactone (ALDACTONE) 25 MG tablet Take 1/2  (one-half) tablet by mouth once daily 45 tablet 3   traZODone (DESYREL) 100 MG tablet Take 200 mg by mouth at bedtime.      No current facility-administered medications on file prior to visit.    No Known Allergies  Social History   Substance and Sexual Activity  Alcohol Use Not Currently   Alcohol/week: 7.0 standard drinks   Types: 7 Cans of beer per week    Social History   Tobacco Use  Smoking Status Never  Smokeless Tobacco Never    Review of Systems  Constitutional:  Positive for malaise/fatigue.  HENT: Negative.    Eyes: Negative.   Respiratory:  Positive for shortness of breath.   Cardiovascular: Negative.   Gastrointestinal: Negative.   Genitourinary:  Positive for frequency.  Musculoskeletal:  Positive for back pain, joint pain and neck pain.  Skin: Negative.   Neurological: Negative.   Endo/Heme/Allergies: Negative.   Psychiatric/Behavioral: Negative.     Objective   Vitals:   11/30/20 1408  BP: 112/76  Pulse: 85  Resp: 18  Temp: 98.2 F (36.8 C)  SpO2: 94%    Physical Exam Vitals reviewed.  Constitutional:      Appearance: Normal appearance. She is not ill-appearing.  HENT:     Head: Normocephalic and atraumatic.  Cardiovascular:     Rate and Rhythm: Normal rate and regular rhythm.     Heart sounds: Normal heart sounds. No murmur heard.   No friction rub. No gallop.  Pulmonary:     Effort: Pulmonary effort is normal. No respiratory distress.     Breath sounds: Normal breath sounds. No stridor. No wheezing, rhonchi or rales.  Abdominal:     General: Bowel sounds are normal. There is no distension.     Palpations: Abdomen is soft. There is no mass.     Tenderness: There is no abdominal tenderness. There is no guarding or rebound.     Hernia: No hernia is present.  Skin:    General: Skin is warm and dry.  Neurological:     Mental Status: She is alert and oriented to person, place, and time.   Cardiology notes reviewed.  Recent laboratory  test reviewed Assessment  Need for screening colonoscopy, anemia of unknown etiology, GERD, occult blood loss Plan  Patient will be scheduled for EGD/colonoscopy with propofol.  The risks and benefits of both procedures including bleeding, infection, and perforation were fully explained to the patient, who gave informed consent.  Soo tabs have been prescribed.

## 2020-12-28 ENCOUNTER — Encounter (HOSPITAL_COMMUNITY)
Admission: RE | Disposition: A | Discharge status: Home / Self Care | Payer: Self-pay | Source: Home / Self Care | Attending: General Surgery

## 2020-12-28 ENCOUNTER — Encounter (HOSPITAL_COMMUNITY): Payer: Self-pay | Admitting: General Surgery

## 2020-12-28 ENCOUNTER — Ambulatory Visit (HOSPITAL_COMMUNITY): Payer: 59 | Admitting: Certified Registered"

## 2020-12-28 ENCOUNTER — Ambulatory Visit (HOSPITAL_COMMUNITY)
Admission: RE | Admit: 2020-12-28 | Discharge: 2020-12-28 | Disposition: A | Discharge status: Home / Self Care | Payer: 59 | Attending: General Surgery | Admitting: General Surgery

## 2020-12-28 DIAGNOSIS — D509 Iron deficiency anemia, unspecified: Secondary | ICD-10-CM | POA: Insufficient documentation

## 2020-12-28 DIAGNOSIS — Z8262 Family history of osteoporosis: Secondary | ICD-10-CM | POA: Insufficient documentation

## 2020-12-28 DIAGNOSIS — I5032 Chronic diastolic (congestive) heart failure: Secondary | ICD-10-CM | POA: Insufficient documentation

## 2020-12-28 DIAGNOSIS — K219 Gastro-esophageal reflux disease without esophagitis: Secondary | ICD-10-CM | POA: Diagnosis not present

## 2020-12-28 DIAGNOSIS — R12 Heartburn: Secondary | ICD-10-CM | POA: Diagnosis not present

## 2020-12-28 DIAGNOSIS — Z823 Family history of stroke: Secondary | ICD-10-CM | POA: Insufficient documentation

## 2020-12-28 DIAGNOSIS — Z8042 Family history of malignant neoplasm of prostate: Secondary | ICD-10-CM | POA: Insufficient documentation

## 2020-12-28 DIAGNOSIS — Z950 Presence of cardiac pacemaker: Secondary | ICD-10-CM | POA: Insufficient documentation

## 2020-12-28 DIAGNOSIS — Z7982 Long term (current) use of aspirin: Secondary | ICD-10-CM | POA: Insufficient documentation

## 2020-12-28 DIAGNOSIS — Z79899 Other long term (current) drug therapy: Secondary | ICD-10-CM | POA: Insufficient documentation

## 2020-12-28 DIAGNOSIS — I110 Hypertensive heart disease with heart failure: Secondary | ICD-10-CM | POA: Diagnosis not present

## 2020-12-28 DIAGNOSIS — Z9884 Bariatric surgery status: Secondary | ICD-10-CM | POA: Diagnosis not present

## 2020-12-28 DIAGNOSIS — Z833 Family history of diabetes mellitus: Secondary | ICD-10-CM | POA: Diagnosis not present

## 2020-12-28 DIAGNOSIS — Z809 Family history of malignant neoplasm, unspecified: Secondary | ICD-10-CM | POA: Diagnosis not present

## 2020-12-28 DIAGNOSIS — Z1211 Encounter for screening for malignant neoplasm of colon: Secondary | ICD-10-CM | POA: Insufficient documentation

## 2020-12-28 DIAGNOSIS — Z8249 Family history of ischemic heart disease and other diseases of the circulatory system: Secondary | ICD-10-CM | POA: Diagnosis not present

## 2020-12-28 DIAGNOSIS — Z8261 Family history of arthritis: Secondary | ICD-10-CM | POA: Diagnosis not present

## 2020-12-28 DIAGNOSIS — I4891 Unspecified atrial fibrillation: Secondary | ICD-10-CM | POA: Insufficient documentation

## 2020-12-28 HISTORY — PX: ESOPHAGOGASTRODUODENOSCOPY (EGD) WITH PROPOFOL: SHX5813

## 2020-12-28 HISTORY — PX: COLONOSCOPY WITH PROPOFOL: SHX5780

## 2020-12-28 SURGERY — COLONOSCOPY WITH PROPOFOL
Anesthesia: General

## 2020-12-28 MED ORDER — PROPOFOL 500 MG/50ML IV EMUL
INTRAVENOUS | Status: DC | PRN
  Administered 2020-12-28: 150 ug/kg/min via INTRAVENOUS

## 2020-12-28 MED ORDER — LACTATED RINGERS IV SOLN: INTRAVENOUS | Status: DC

## 2020-12-28 MED ORDER — EPHEDRINE 5 MG/ML INJ
INTRAVENOUS | Status: AC
  Filled 2020-12-28: qty 5

## 2020-12-28 MED ORDER — PHENYLEPHRINE 40 MCG/ML (10ML) SYRINGE FOR IV PUSH (FOR BLOOD PRESSURE SUPPORT)
PREFILLED_SYRINGE | INTRAVENOUS | Status: DC | PRN
  Administered 2020-12-28 (×3): 80 ug via INTRAVENOUS

## 2020-12-28 MED ORDER — SODIUM CHLORIDE 0.9 % IV SOLN: INTRAVENOUS | Status: DC

## 2020-12-28 MED ORDER — EPHEDRINE SULFATE-NACL 50-0.9 MG/10ML-% IV SOSY
PREFILLED_SYRINGE | INTRAVENOUS | Status: DC | PRN
  Administered 2020-12-28 (×2): 5 mg via INTRAVENOUS

## 2020-12-28 MED ORDER — LIDOCAINE HCL (CARDIAC) PF 100 MG/5ML IV SOSY
PREFILLED_SYRINGE | INTRAVENOUS | Status: DC | PRN
  Administered 2020-12-28: 50 mg via INTRAVENOUS

## 2020-12-28 MED ORDER — PHENYLEPHRINE 40 MCG/ML (10ML) SYRINGE FOR IV PUSH (FOR BLOOD PRESSURE SUPPORT)
PREFILLED_SYRINGE | INTRAVENOUS | Status: AC
  Filled 2020-12-28: qty 10

## 2020-12-28 MED ORDER — PROPOFOL 10 MG/ML IV BOLUS
INTRAVENOUS | Status: DC | PRN
  Administered 2020-12-28: 50 mg via INTRAVENOUS
  Administered 2020-12-28: 100 mg via INTRAVENOUS

## 2020-12-28 NOTE — Transfer of Care (Signed)
Immediate Anesthesia Transfer of Care Note  Patient: [name] [name]  Procedure(s) Performed: COLONOSCOPY WITH PROPOFOL ESOPHAGOGASTRODUODENOSCOPY (EGD) WITH PROPOFOL  Patient Location: Short Stay  Anesthesia Type:General  Level of Consciousness: awake and oriented  Airway & Oxygen Therapy: Patient Spontanous Breathing  Post-op Assessment: Report given to RN, Post -op Vital signs reviewed and stable and Patient moving all extremities X 4  Post vital signs: Reviewed and stable  Last Vitals:  Vitals Value Taken Time  BP    Temp    Pulse    Resp    SpO2      Last Pain:  Vitals:   12/28/20 0733  PainSc: 0-No pain         Complications: No notable events documented.

## 2020-12-28 NOTE — Op Note (Signed)
[facility] Patient Name: [name] [name] Procedure Date: 12/28/2020 7:21 AM MRN: [id number] Date of Birth: 1960/12/01 Attending MD: Aviva Signs , MD CSN: [id number] Age: 60 Admit Type: Outpatient Procedure:                Colonoscopy Indications:              Screening for colorectal malignant neoplasm Providers:                Aviva Signs, MD, Janeece Riggers, RN, Kristine L.                            Risa Grill, Technician Referring MD:              Medicines:                See the Anesthesia note for documentation of the                            administered medications Complications:            No immediate complications. Estimated Blood Loss:     Estimated blood loss: none. Procedure:                Pre-Anesthesia Assessment:                           - Prior to the procedure, a History and Physical                            was performed, and patient medications and                            allergies were reviewed. The patient is competent.                            The risks and benefits of the procedure and the                            sedation options and risks were discussed with the                            patient. All questions were answered and informed                            consent was obtained. Patient identification and                            proposed procedure were verified by the physician,                            the nurse, the anesthesiologist and the anesthetist                            in the endoscopy suite. Mental Status Examination:  alert and oriented. Airway Examination: normal                            oropharyngeal airway and neck mobility. Respiratory                            Examination: clear to auscultation. CV Examination:                            RRR, no murmurs, no S3 or S4. Prophylactic                            Antibiotics: The patient does not require                             prophylactic antibiotics. Prior Anticoagulants: The                            patient has taken no previous anticoagulant or                            antiplatelet agents except for aspirin. ASA Grade                            Assessment: III - A patient with severe systemic                            disease. After reviewing the risks and benefits,                            the patient was deemed in satisfactory condition to                            undergo the procedure. The anesthesia plan was to                            use monitored anesthesia care (MAC). Immediately                            prior to administration of medications, the patient                            was re-assessed for adequacy to receive sedatives.                            The heart rate, respiratory rate, oxygen                            saturations, blood pressure, adequacy of pulmonary                            ventilation, and response to care were monitored  throughout the procedure. The physical status of                            the patient was re-assessed after the procedure.                           After obtaining informed consent, the colonoscope                            was passed under direct vision. Throughout the                            procedure, the patient's blood pressure, pulse, and                            oxygen saturations were monitored continuously. The                            [id number]) scope was introduced through the                            anus and advanced to the the cecum, identified by                            the appendiceal orifice, ileocecal valve and                            palpation. No anatomical landmarks were                            photographed. The colonoscopy was somewhat                            difficult due to poor bowel prep with stool                            present. The quality of the bowel  preparation was                            poor. The patient tolerated the procedure well. The                            total duration of the procedure was 20 minutes. Scope In: 7:37:10 AM Scope Out: 7:57:23 AM Scope Withdrawal Time: 0 hours 3 minutes 10 seconds  Total Procedure Duration: 0 hours 20 minutes 13 seconds  Findings:      The entire examined colon appeared normal. No gross mass lesions noted.       Small polypoid disease not seen, though bowel prep limited examination. Impression:               - Preparation of the colon was poor.                           - The entire examined colon is normal.                           -  No specimens collected. Moderate Sedation:      Moderate (conscious) sedation was personally administered by an       anesthesia professional. The following parameters were monitored: oxygen       saturation, heart rate, blood pressure, and response to care. Recommendation:           - Written discharge instructions were provided to                            the patient.                           - The signs and symptoms of potential delayed                            complications were discussed with the patient.                           - Patient has a contact number available for                            emergencies.                           - Return to normal activities tomorrow.                           - Resume previous diet.                           - Continue present medications.                           - Repeat colonoscopy in 10 years for screening                            purposes. Procedure Code(s):        --- Professional ---                           [phone], Colonoscopy, flexible; diagnostic, including                            collection of specimen(s) by brushing or washing,                            when performed (separate procedure) Diagnosis Code(s):        --- Professional ---                           Z12.11, Encounter  for screening for malignant                            neoplasm of colon CPT copyright 2019 [employer] Medical Association. All rights reserved. The codes documented in this report are preliminary and upon coder review may  be revised to meet current compliance requirements. Aviva Signs, MD Aviva Signs, MD 12/28/2020 8:13:03 AM This  report has been signed electronically. Number of Addenda: 0

## 2020-12-28 NOTE — Interval H&P Note (Signed)
History and Physical Interval Note:  12/28/2020 7:26 AM  [name] [name]  has presented today for surgery, with the diagnosis of rectal bleeding,reflux.  The various methods of treatment have been discussed with the patient and family. After consideration of risks, benefits and other options for treatment, the patient has consented to  Procedure(s): COLONOSCOPY WITH PROPOFOL (N/A) ESOPHAGOGASTRODUODENOSCOPY (EGD) WITH PROPOFOL (N/A) BIOPSY (N/A) as a surgical intervention.  The patient's history has been reviewed, patient examined, no change in status, stable for surgery.  I have reviewed the patient's chart and labs.  Questions were answered to the patient's satisfaction.     Franky Macho

## 2020-12-28 NOTE — Anesthesia Procedure Notes (Signed)
Date/Time: 12/28/2020 7:38 AM Performed by: Julian Reil, CRNA Pre-anesthesia Checklist: Patient identified, Emergency Drugs available, Suction available and Patient being monitored Patient Re-evaluated:Patient Re-evaluated prior to induction Oxygen Delivery Method: Nasal cannula Induction Type: IV induction Placement Confirmation: positive ETCO2

## 2020-12-28 NOTE — Op Note (Signed)
[facility] Patient Name: [name] [name] Procedure Date: 12/28/2020 7:10 AM MRN: [id number] Date of Birth: [birth year] Attending MD: Franky Macho , MD CSN: [id number] Age: 60 Admit Type: Outpatient Procedure:                Upper GI endoscopy Indications:              Iron deficiency anemia, Heartburn Providers:                Franky Macho, MD, Jannett Celestine, RN, Kristine L.                            Jessee Avers, Technician Referring MD:              Medicines:                Monitored Anesthesia Care Complications:            No immediate complications. Estimated Blood Loss:     Estimated blood loss: none. Procedure:                Pre-Anesthesia Assessment:                           - Prior to the procedure, a History and Physical                            was performed, and patient medications and                            allergies were reviewed. The patient is competent.                            The risks and benefits of the procedure and the                            sedation options and risks were discussed with the                            patient. All questions were answered and informed                            consent was obtained. Patient identification and                            proposed procedure were verified by the physician,                            the nurse, the anesthesiologist and the anesthetist                            in the endoscopy suite. Mental Status Examination:                            alert and oriented. Airway Examination: normal  oropharyngeal airway and neck mobility. Respiratory                            Examination: clear to auscultation. CV Examination:                            RRR, no murmurs, no S3 or S4. Prophylactic                            Antibiotics: The patient does not require                            prophylactic antibiotics. Prior Anticoagulants: The                             patient has taken no previous anticoagulant or                            antiplatelet agents except for aspirin. ASA Grade                            Assessment: III - A patient with severe systemic                            disease. After reviewing the risks and benefits,                            the patient was deemed in satisfactory condition to                            undergo the procedure. The anesthesia plan was to                            use moderate sedation / analgesia (conscious                            sedation). Immediately prior to administration of                            medications, the patient was re-assessed for                            adequacy to receive sedatives. The heart rate,                            respiratory rate, oxygen saturations, blood                            pressure, adequacy of pulmonary ventilation, and                            response to care were monitored throughout the  procedure. The physical status of the patient was                            re-assessed after the procedure.                           After obtaining informed consent, the endoscope was                            passed under direct vision. Throughout the                            procedure, the patient's blood pressure, pulse, and                            oxygen saturations were monitored continuously. The                            GIF-H190 ([id number]) scope was introduced through the                            mouth, and advanced to the afferent and efferent                            jejunal loops. The upper GI endoscopy was                            accomplished with ease. The patient tolerated the                            procedure well. Scope In: 8:00:11 AM Scope Out: 8:04:03 AM Total Procedure Duration: 0 hours 3 minutes 52 seconds  Findings:      The examined jejunum was normal. The gastrojejejunal anastomosis widely        patent without ulceration.      The anastomosis was normal. Estimated blood loss: none.      The Z-line was found 36 cm from the incisors. Estimated blood loss:       none. GE juncture within normal limits. No esophagitis noted.      The nasopharynx and oropharynx were normal. Impression:               - Normal examined jejunum.                           - Normal anastomosis.                           - Z-line, 36 cm from the incisors.                           - Normal nasopharynx and oropharynx.                           - No specimens collected.                           -  Normal examination. Moderate Sedation:      Moderate (conscious) sedation was personally administered by an       anesthesia professional. The following parameters were monitored: oxygen       saturation, heart rate, blood pressure, and response to care. [Sedation       Duration Time]. Recommendation:           - Written discharge instructions were provided to                            the patient.                           - The signs and symptoms of potential delayed                            complications were discussed with the patient.                           - Patient has a contact number available for                            emergencies.                           - Return to normal activities tomorrow.                           - Resume previous diet.                           - Continue present medications.                           Anemia could be secondary to B12 deficiency as                            patient has had gastric bypass. Procedure Code(s):        --- Professional ---                           [phone], Esophagogastroduodenoscopy, flexible,                            transoral; diagnostic, including collection of                            specimen(s) by brushing or washing, when performed                            (separate procedure) Diagnosis Code(s):        --- Professional  ---                           D50.9, Iron deficiency anemia, unspecified                           R12, Heartburn CPT copyright 2019  American Medical Association. All rights reserved. The codes documented in this report are preliminary and upon coder review may  be revised to meet current compliance requirements. Franky Macho, MD Franky Macho, MD 12/28/2020 8:21:18 AM This report has been signed electronically. Number of Addenda: 0

## 2020-12-28 NOTE — Anesthesia Postprocedure Evaluation (Signed)
Anesthesia Post Note  Patient: [name] [name]  Procedure(s) Performed: COLONOSCOPY WITH PROPOFOL ESOPHAGOGASTRODUODENOSCOPY (EGD) WITH PROPOFOL  Patient location during evaluation: Phase II Anesthesia Type: General Level of consciousness: awake Pain management: pain level controlled Vital Signs Assessment: post-procedure vital signs reviewed and stable Respiratory status: spontaneous breathing and respiratory function stable Cardiovascular status: blood pressure returned to baseline and stable Postop Assessment: no headache and no apparent nausea or vomiting Anesthetic complications: no Comments: Late entry   No notable events documented.   Last Vitals:  Vitals:   12/28/20 0811 12/28/20 0821  BP: (!) 89/42 125/66  Pulse: 67   Resp: 14   Temp: (!) 36.4 C   SpO2: 99%     Last Pain:  Vitals:   12/28/20 0821  TempSrc:   PainSc: 0-No pain                 Windell Norfolk

## 2020-12-28 NOTE — Anesthesia Preprocedure Evaluation (Signed)
Anesthesia Evaluation  Patient identified by MRN, date of birth, ID band Patient awake    Reviewed: Allergy & Precautions, H&P , NPO status , Patient's Chart, lab work & pertinent test results, reviewed documented beta blocker date and time   Airway Mallampati: II  TM Distance: >3 FB Neck ROM: full    Dental no notable dental hx.    Pulmonary shortness of breath,    Pulmonary exam normal breath sounds clear to auscultation       Cardiovascular Exercise Tolerance: Good + pacemaker  Rhythm:regular Rate:Normal     Neuro/Psych PSYCHIATRIC DISORDERS Depression negative neurological ROS     GI/Hepatic Neg liver ROS, GERD  Medicated,  Endo/Other  Morbid obesity  Renal/GU negative Renal ROS  negative genitourinary   Musculoskeletal   Abdominal   Peds  Hematology  (+) Blood dyscrasia, anemia ,   Anesthesia Other Findings   Reproductive/Obstetrics negative OB ROS                             Anesthesia Physical Anesthesia Plan  ASA: 3  Anesthesia Plan: General   Post-op Pain Management:    Induction:   PONV Risk Score and Plan: Propofol infusion  Airway Management Planned:   Additional Equipment:   Intra-op Plan:   Post-operative Plan:   Informed Consent: I have reviewed the patients History and Physical, chart, labs and discussed the procedure including the risks, benefits and alternatives for the proposed anesthesia with the patient or authorized representative who has indicated his/her understanding and acceptance.     Dental Advisory Given  Plan Discussed with: CRNA  Anesthesia Plan Comments:         Anesthesia Quick Evaluation

## 2021-01-04 ENCOUNTER — Encounter (HOSPITAL_COMMUNITY): Payer: Self-pay | Admitting: General Surgery

## 2021-01-13 ENCOUNTER — Ambulatory Visit (INDEPENDENT_AMBULATORY_CARE_PROVIDER_SITE_OTHER): Payer: 59

## 2021-01-13 DIAGNOSIS — I442 Atrioventricular block, complete: Secondary | ICD-10-CM

## 2021-01-17 LAB — CUP PACEART REMOTE DEVICE CHECK
Battery Remaining Longevity: 124 mo
Battery Voltage: 3.16 V
Brady Statistic AP VP Percent: 37.42 %
Brady Statistic AP VS Percent: 0 %
Brady Statistic AS VP Percent: 62.56 %
Brady Statistic AS VS Percent: 0.02 %
Brady Statistic RA Percent Paced: 40.5 %
Brady Statistic RV Percent Paced: 99.98 %
Date Time Interrogation Session: 20221007170115
Implantable Lead Implant Date: 20100201
Implantable Lead Implant Date: 20100201
Implantable Lead Implant Date: 20220706
Implantable Lead Location: 753858
Implantable Lead Location: 753859
Implantable Lead Location: 753860
Implantable Lead Model: 4598
Implantable Lead Model: 5076
Implantable Lead Model: 5076
Implantable Pulse Generator Implant Date: 20220706
Lead Channel Impedance Value: 304 Ohm
Lead Channel Impedance Value: 323 Ohm
Lead Channel Impedance Value: 380 Ohm
Lead Channel Impedance Value: 399 Ohm
Lead Channel Impedance Value: 399 Ohm
Lead Channel Impedance Value: 399 Ohm
Lead Channel Impedance Value: 418 Ohm
Lead Channel Impedance Value: 437 Ohm
Lead Channel Impedance Value: 475 Ohm
Lead Channel Impedance Value: 665 Ohm
Lead Channel Impedance Value: 684 Ohm
Lead Channel Impedance Value: 760 Ohm
Lead Channel Impedance Value: 760 Ohm
Lead Channel Impedance Value: 779 Ohm
Lead Channel Pacing Threshold Amplitude: 0.875 V
Lead Channel Pacing Threshold Pulse Width: 0.4 ms
Lead Channel Sensing Intrinsic Amplitude: 0.875 mV
Lead Channel Sensing Intrinsic Amplitude: 0.875 mV
Lead Channel Sensing Intrinsic Amplitude: 3.375 mV
Lead Channel Setting Pacing Amplitude: 2 V
Lead Channel Setting Pacing Amplitude: 2 V
Lead Channel Setting Pacing Amplitude: 2 V
Lead Channel Setting Pacing Pulse Width: 0.4 ms
Lead Channel Setting Pacing Pulse Width: 0.4 ms
Lead Channel Setting Sensing Sensitivity: 0.9 mV

## 2021-01-21 NOTE — Progress Notes (Signed)
Remote pacemaker transmission.   

## 2021-02-01 ENCOUNTER — Ambulatory Visit (INDEPENDENT_AMBULATORY_CARE_PROVIDER_SITE_OTHER): Payer: 59 | Admitting: Internal Medicine

## 2021-02-01 ENCOUNTER — Encounter: Payer: Self-pay | Admitting: Internal Medicine

## 2021-02-01 ENCOUNTER — Other Ambulatory Visit: Payer: Self-pay

## 2021-02-01 VITALS — BP 110/60 | HR 88 | Ht 65.0 in | Wt 281.0 lb

## 2021-02-01 DIAGNOSIS — I471 Supraventricular tachycardia: Secondary | ICD-10-CM

## 2021-02-01 DIAGNOSIS — I4719 Other supraventricular tachycardia: Secondary | ICD-10-CM

## 2021-02-01 DIAGNOSIS — I5032 Chronic diastolic (congestive) heart failure: Secondary | ICD-10-CM | POA: Diagnosis not present

## 2021-02-01 DIAGNOSIS — I442 Atrioventricular block, complete: Secondary | ICD-10-CM | POA: Diagnosis not present

## 2021-02-01 DIAGNOSIS — I428 Other cardiomyopathies: Secondary | ICD-10-CM

## 2021-02-01 DIAGNOSIS — Z862 Personal history of diseases of the blood and blood-forming organs and certain disorders involving the immune mechanism: Secondary | ICD-10-CM

## 2021-02-01 DIAGNOSIS — Z950 Presence of cardiac pacemaker: Secondary | ICD-10-CM

## 2021-02-01 MED ORDER — EMPAGLIFLOZIN 10 MG PO TABS: 10.0000 mg | ORAL_TABLET | Freq: Every day | ORAL | 11 refills | Status: DC

## 2021-02-01 NOTE — Progress Notes (Signed)
Patient ID: [name] [name], female   DOB: Dec 16, 1960, 60 y.o.   MRN: [id number]       Patient Care Team: Estanislado Pandy, MD as PCP - General (Cardiology) [name], MD (Cardiology) Hillis Range, MD (Cardiology)   HPI  [name] [name] is a 60 y.o. 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 She underwent generator replacement in April of 2008.  CRT upgrade 6/22 at generator replacement   Lengthy discussions 12/21 regarding CRT upgrade.  In the midst of the COVID uptake, she was reluctant to undergo that.  She was started on Entresto.  Her diuretics were uptitrated. She is significantly better but still limited  Device History: MDT dual chamber PPM implanted 1995 for complete heart block; gen change 2009; extraction 2010 2/2 infection with reimplantation on right side CRT upgrade 6/22   The patient denies chest pain.  There have been no, lightheadedness or syncope.  Complains of significant ongoing shortness of breath.  Not notably better but clearly not worse.  Remembers how much better she felt after she was diuresed 15 pounds a couple of years ago, the best she felt in years.  Nocturnal dyspnea and orthopnea.  Sometimes she needs a third pillow.  She has fluid deplete but does not watch her salt.  Response to furosemide is relatively brisk and limited.  Marland Kitchen   DATE TEST EF    10/15 Echo   55 % MR mod-severe  12/15 TEE  55 % MR 2 jets, mild-mod  4/18 LHC 35-45% CA normal  1/20 Echo 40-45% MR md  2/20 TEE 45-50 MVP mild/MR mod  10/20 Echo (CE) 40% MR mild-mod  10/21 TEE EF 45-50% MR  mod LA emptying vel 60 cm/sec         DATE CR K HGB Plt  11/18 0.63      10/20 0.65(2/20) 0.57 (2/20) 4.4  14.2 76<<195  05/22 0.87 5.1    9/22 0.81 4.3 10.9 (6/22) 241   GI eval, colonoscopy and endoscopy neg   Past Medical History:  Diagnosis Date   Anemia    Atrial fibrillation (HCC)    Chronic  diastolic heart failure (HCC)    EF 40-45%   Depression    history of depression in the past.   Dysrhythmia    GERD (gastroesophageal reflux disease)    Mitral valvular regurgitation    Pacemaker- medtronic    Initial PM 1995; generator 2009, infection with contralateral implant 2010   S/P AV nodal ablation    Shortness of breath dyspnea     Past Surgical History:  Procedure Laterality Date   ABDOMINAL ADHESION SURGERY  04/2006   BIV UPGRADE N/A 10/13/2020   Procedure: BIV UPGRADE;  Surgeon: [name], MD;  Location: [facility] INVASIVE CV LAB;  Service: Cardiovascular;  Laterality: N/A;   CARDIAC CATHETERIZATION  09/11/2007   CARDIAC PACEMAKER PLACEMENT  1995   [facility],Medtronic Adapta ADDRL1   CATARACT EXTRACTION, BILATERAL     COLONOSCOPY WITH PROPOFOL N/A 12/28/2020   Procedure: COLONOSCOPY WITH PROPOFOL;  Surgeon: Franky Macho, MD;  Location: [facility] ENDO SUITE;  Service: Gastroenterology;  Laterality: N/A;   ESOPHAGOGASTRODUODENOSCOPY (EGD) WITH PROPOFOL N/A 12/28/2020   Procedure: ESOPHAGOGASTRODUODENOSCOPY (EGD) WITH PROPOFOL;  Surgeon: Franky Macho, MD;  Location: [facility] ENDO [facility];  Service: Gastroenterology;  Laterality: N/A;   GASTRIC BYPASS  09/2003   LEFT HEART CATH AND CORONARY ANGIOGRAPHY N/A  07/18/2016   Procedure: Left Heart Cath and Coronary Angiography;  Surgeon: Lennette Bihari, MD;  Location: [facility] INVASIVE CV LAB;  Service: Cardiovascular;  Laterality: N/A;   TEE WITHOUT CARDIOVERSION N/A 03/19/2014   Procedure: TRANSESOPHAGEAL ECHOCARDIOGRAM (TEE);  Surgeon: Antoine Poche, MD;  Location: [facility];  Service: Endoscopy;  Laterality: N/A;   TEE WITHOUT CARDIOVERSION N/A 05/21/2018   Procedure: TRANSESOPHAGEAL ECHOCARDIOGRAM (TEE);  Surgeon: Antoine Poche, MD;  Location: [facility];  Service: Endoscopy;  Laterality: N/A;   TEE WITHOUT CARDIOVERSION N/A 01/29/2020   Procedure: TRANSESOPHAGEAL ECHOCARDIOGRAM (TEE) WITH PROPOFOL;  Surgeon: Antoine Poche, MD;  Location: [facility];  Service: Endoscopy;  Laterality: N/A;  scheduled by Mindi Junker @ Dr. Wilmon Arms office    Current Outpatient Medications  Medication Sig Dispense Refill   ARIPiprazole (ABILIFY) 10 MG tablet Take 10 mg by mouth at bedtime.      aspirin EC 81 MG tablet Take 81 mg by mouth daily.     carvedilol (COREG) 3.125 MG tablet Take 6.25 mg by mouth 2 (two) times daily.  6   chlorhexidine (PERIDEX) 0.12 % solution 15 mLs by Mouth Rinse route 2 (two) times daily as needed (gum irritation).     DULoxetine (CYMBALTA) 20 MG capsule Take 20 mg by mouth 2 (two) times daily.     furosemide (LASIX) 40 MG tablet Take 80 mg by mouth daily.     Melatonin 10 MG TABS Take 20 mg by mouth at bedtime.      meloxicam (MOBIC) 15 MG tablet Take 15 mg by mouth daily.     omeprazole (PRILOSEC) 20 MG capsule Take 20 mg by mouth daily before breakfast.      sacubitril-valsartan (ENTRESTO) 24-26 MG Take 1 tablet by mouth 2 (two) times daily. 180 tablet 1   spironolactone (ALDACTONE) 25 MG tablet Take 1/2 (one-half) tablet by mouth once daily 45 tablet 3   traZODone (DESYREL) 100 MG tablet Take 200 mg by mouth at bedtime.      No current facility-administered medications for this visit.    No Known Allergies  Review of Systems negative except from HPI and PMH  Physical Exam BP 110/60 (BP Location: Left Arm, Patient Position: Sitting, Cuff Size: Large)   Pulse 88   Ht 5\' 5"  (1.651 m)   Wt 281 lb (127.5 kg)   SpO2 97%   BMI 46.76 kg/m  Well developed and well nourished in no acute distress HENT normal Neck supple with JVP-greater than 10  clear Device pocket well healed; without hematoma or erythema.  There is no tethering  Regular rate and rhythm, no  gallop No murmur Abd-soft with active BS No Clubbing cyanosis 1+ edema Skin-warm and dry A & Oriented  Grossly normal sensory and motor function  ECG atrial tachycardia with underlying biventricular pacing  Q  in lead I and a  monophasic R wave in lead V1  ECG preprocedure QRSd 185 ECG postprocedure QRSd 126 ECG 9/22 QRSd about 135 ms  Assessment and  Plan  Complete heart block status post AV junction ablation    Cardiomyopathy-nonischemic ejection fraction 50-55 >> 35  HFpEF//HFrEF chronic  .   Pacemaker  medtronic        Atrial tachy    Syncope  Mitral regurgitation-mild-moderate  Anemia (GI evaluation ongoing)  Still significantly short of breath.  We will add Jardiance 10 mg daily.  She also is volume overloaded, does not have a scale at home, which  clinically for diuresis.  We will increase her furosemide to 80 daily to twice daily every other day for about 2 weeks and reassess.  Repeat echocardiogram. She has been anemic, in the past she has needed iron repletion.  No clear source of blood loss.  We will check a CBC today and a ferritin.  Continue her Entresto 24/26, carvedilol 3.125 and spironolactone 12.5 for her cardiomyopathy.  Some diaphragmatic stimulation, the vector was changed from 1>>2 to 2>>1; without diaphragmatic stimulation at 8 V.  Discussed again discontinuing aspirin.

## 2021-02-01 NOTE — Patient Instructions (Addendum)
Medication Instructions:  Your physician has recommended you make the following change in your medication:   Furosemide 80mg  - twice daily every other day x 15 days then return to normal dosing.  Stop ASA  Begin Jardiance 10mg  - 1 tablet by mouth daily  *If you need a refill on your cardiac medications before your next appointment, please call your pharmacy*   Lab Work: CBC and Ferritin  If you have labs (blood work) drawn today and your tests are completely normal, you will receive your results only by: MyChart Message (if you have MyChart) OR A paper copy in the mail If you have any lab test that is abnormal or we need to change your treatment, we will call you to review the results.   Testing/Procedures: Your physician has requested that you have an echocardiogram. Echocardiography is a painless test that uses sound waves to create images of your heart. It provides your doctor with information about the size and shape of your heart and how well your heart's chambers and valves are working. This procedure takes approximately one hour. There are no restrictions for this procedure.    Follow-Up: At [facility], you and your health needs are our priority.  As part of our continuing mission to provide you with exceptional heart care, we have created designated Provider Care Teams.  These Care Teams include your primary Cardiologist (physician) and Advanced Practice Providers (APPs -  Physician Assistants and Nurse Practitioners) who all work together to provide you with the care you need, when you need it.  We recommend signing up for the patient portal called "MyChart".  Sign up information is provided on this After Visit Summary.  MyChart is used to connect with patients for Virtual Visits (Telemedicine).  Patients are able to view lab/test results, encounter notes, upcoming appointments, etc.  Non-urgent messages can be sent to your provider as well.   To learn more about what you  can do with MyChart, go to .    Your next appointment:   3 months with [facility], [facility]

## 2021-02-02 LAB — CBC
Hematocrit: 34.2 % (ref 34.0–46.6)
Hemoglobin: 10.8 g/dL — ABNORMAL LOW (ref 11.1–15.9)
MCH: 25.7 pg — ABNORMAL LOW (ref 26.6–33.0)
MCHC: 31.6 g/dL (ref 31.5–35.7)
MCV: 81 fL (ref 79–97)
Platelets: 310 10*3/uL (ref 150–450)
RBC: 4.2 x10E6/uL (ref 3.77–5.28)
RDW: 15.8 % — ABNORMAL HIGH (ref 11.7–15.4)
WBC: 5.4 10*3/uL (ref 3.4–10.8)

## 2021-02-02 LAB — FERRITIN: Ferritin: 12 ng/mL — ABNORMAL LOW (ref 15–150)

## 2021-02-08 ENCOUNTER — Telehealth: Payer: Self-pay

## 2021-02-08 NOTE — Telephone Encounter (Signed)
Spoke with pt and advised per Dr Graciela Husbands labs are consistent with iron deficiency anemia.  Pt verbalizes understanding and states she will follow up with her PCP.

## 2021-02-08 NOTE — Telephone Encounter (Signed)
-----   Message from [name], MD sent at 02/05/2021  5:40 PM EDT ----- Please Inform Patient that Hgb  is abnormal and consistent with iron deficiency and will require further eval with her PCP  Thanks

## 2021-03-10 ENCOUNTER — Telehealth: Payer: Self-pay

## 2021-03-10 NOTE — Telephone Encounter (Signed)
**Note De-Identified [name] [name] Obfuscation** The pt left her completed [employer] Pt Asst application for [place] with documents at the office.   I have completed the providers page of her application and have emailed all to Dr Odessa Fleming nurse so she can print a Entresto prescription for #180 with 3 refills, obtain Dr Odessa Fleming signature on it and the application, date it, and to fax all to [employer] at the fax number written on the cover letter included or to place in the to be faxed basket in Medical Records to be faxed.

## 2021-03-10 NOTE — Telephone Encounter (Signed)
Printed, signed and placed with medical records for faxing.

## 2021-03-11 ENCOUNTER — Encounter: Payer: Self-pay | Admitting: Internal Medicine

## 2021-03-21 ENCOUNTER — Other Ambulatory Visit: Payer: Self-pay

## 2021-03-21 MED ORDER — ENTRESTO 24-26 MG PO TABS: 1.0000 | ORAL_TABLET | Freq: Two times a day (BID) | ORAL | 3 refills | Status: DC

## 2021-03-21 NOTE — Telephone Encounter (Signed)
Received fax from [employer] that they did not receive a signed Rx. I printed out an Rx and had Dr Graciela Husbands sign it and it has been faxed to [employer].  Confirmation received.

## 2021-04-07 ENCOUNTER — Other Ambulatory Visit: Payer: Self-pay

## 2021-04-07 ENCOUNTER — Ambulatory Visit (INDEPENDENT_AMBULATORY_CARE_PROVIDER_SITE_OTHER): Payer: 59

## 2021-04-07 DIAGNOSIS — I5032 Chronic diastolic (congestive) heart failure: Secondary | ICD-10-CM | POA: Diagnosis not present

## 2021-04-07 DIAGNOSIS — I428 Other cardiomyopathies: Secondary | ICD-10-CM | POA: Diagnosis not present

## 2021-04-07 LAB — ECHOCARDIOGRAM COMPLETE
AR max vel: 3.17 cm2
AV Area VTI: 3.11 cm2
AV Area mean vel: 3.62 cm2
AV Mean grad: 3.8 mmHg
AV Peak grad: 7.9 mmHg
Ao pk vel: 1.4 m/s
Area-P 1/2: 4.29 cm2
Calc EF: 54.8 %
MV M vel: 5.3 m/s
MV Peak grad: 112.4 mmHg
Radius: 0.5 cm
S' Lateral: 3.93 cm
Single Plane A2C EF: 52.3 %
Single Plane A4C EF: 58.9 %

## 2021-04-19 NOTE — Telephone Encounter (Signed)
**Note De-Identified [name] [name] Obfuscation** Letter received [name] fax from [employer] stating that they have approved the pt for asst with Entresto until 04/09/2022. Pt ID: [id number]  The letter states that they have notified the pt of this approval as well.

## 2021-05-09 ENCOUNTER — Ambulatory Visit: Payer: 59 | Admitting: Student

## 2021-05-09 ENCOUNTER — Encounter: Payer: Self-pay | Admitting: Student

## 2021-05-09 ENCOUNTER — Other Ambulatory Visit: Payer: Self-pay

## 2021-05-09 VITALS — BP 88/54 | HR 102 | Ht 65.0 in | Wt 290.8 lb

## 2021-05-09 DIAGNOSIS — I428 Other cardiomyopathies: Secondary | ICD-10-CM

## 2021-05-09 DIAGNOSIS — I5032 Chronic diastolic (congestive) heart failure: Secondary | ICD-10-CM | POA: Diagnosis not present

## 2021-05-09 DIAGNOSIS — I471 Supraventricular tachycardia: Secondary | ICD-10-CM

## 2021-05-09 DIAGNOSIS — Z862 Personal history of diseases of the blood and blood-forming organs and certain disorders involving the immune mechanism: Secondary | ICD-10-CM

## 2021-05-09 DIAGNOSIS — I442 Atrioventricular block, complete: Secondary | ICD-10-CM | POA: Diagnosis not present

## 2021-05-09 LAB — CUP PACEART INCLINIC DEVICE CHECK
Battery Remaining Longevity: 128 mo
Battery Voltage: 3.07 V
Brady Statistic AP VP Percent: 39.07 %
Brady Statistic AP VS Percent: 0 %
Brady Statistic AS VP Percent: 60.88 %
Brady Statistic AS VS Percent: 0.05 %
Brady Statistic RA Percent Paced: 42.38 %
Brady Statistic RV Percent Paced: 99.95 %
Date Time Interrogation Session: 20230130120819
Implantable Lead Implant Date: 20100201
Implantable Lead Implant Date: 20100201
Implantable Lead Implant Date: 20220706
Implantable Lead Location: 753858
Implantable Lead Location: 753859
Implantable Lead Location: 753860
Implantable Lead Model: 4598
Implantable Lead Model: 5076
Implantable Lead Model: 5076
Implantable Pulse Generator Implant Date: 20220706
Lead Channel Impedance Value: 342 Ohm
Lead Channel Impedance Value: 361 Ohm
Lead Channel Impedance Value: 399 Ohm
Lead Channel Impedance Value: 418 Ohm
Lead Channel Impedance Value: 437 Ohm
Lead Channel Impedance Value: 456 Ohm
Lead Channel Impedance Value: 456 Ohm
Lead Channel Impedance Value: 494 Ohm
Lead Channel Impedance Value: 551 Ohm
Lead Channel Impedance Value: 684 Ohm
Lead Channel Impedance Value: 722 Ohm
Lead Channel Impedance Value: 798 Ohm
Lead Channel Impedance Value: 817 Ohm
Lead Channel Impedance Value: 893 Ohm
Lead Channel Pacing Threshold Amplitude: 1 V
Lead Channel Pacing Threshold Pulse Width: 0.4 ms
Lead Channel Sensing Intrinsic Amplitude: 0.5 mV
Lead Channel Sensing Intrinsic Amplitude: 1.125 mV
Lead Channel Sensing Intrinsic Amplitude: 3.25 mV
Lead Channel Setting Pacing Amplitude: 1 V
Lead Channel Setting Pacing Amplitude: 2 V
Lead Channel Setting Pacing Amplitude: 2 V
Lead Channel Setting Pacing Pulse Width: 0.4 ms
Lead Channel Setting Pacing Pulse Width: 0.8 ms
Lead Channel Setting Sensing Sensitivity: 0.9 mV

## 2021-05-09 NOTE — Progress Notes (Signed)
Electrophysiology Office Note Date: 05/09/2021  ID:  Zuhra, Lingley [birth year], MRN [id number]  PCP: Estanislado Pandy, MD Primary Cardiologist: None Electrophysiologist: Sherryl Manges, MD   CC: Pacemaker follow-up  [name] [name] is a 61 y.o. female seen today for Sherryl Manges, MD for routine electrophysiology followup.  Since last being seen in our clinic the patient reports doing very well.  she denies chest pain, palpitations, dyspnea, PND, orthopnea, nausea, vomiting, dizziness, syncope, edema, weight gain, or early satiety.  Device History: Medtronic Dual Chamber PPM implanted 05/12/2019 for CHB/AV nodal ablation  Past Medical History:  Diagnosis Date   Anemia    Atrial fibrillation (HCC)    Chronic diastolic heart failure (HCC)    EF 40-45%   Depression    history of depression in the past.   Dysrhythmia    GERD (gastroesophageal reflux disease)    Mitral valvular regurgitation    Pacemaker- medtronic    Initial PM 1995; generator 2009, infection with contralateral implant 2010   S/P AV nodal ablation    Shortness of breath dyspnea    Past Surgical History:  Procedure Laterality Date   ABDOMINAL ADHESION SURGERY  04/2006   BIV UPGRADE N/A 10/13/2020   Procedure: BIV UPGRADE;  Surgeon: [name], MD;  Location: [facility];  Service: Cardiovascular;  Laterality: N/A;   CARDIAC CATHETERIZATION  09/11/2007   CARDIAC PACEMAKER PLACEMENT  1995   [facility],Medtronic Adapta ADDRL1   CATARACT EXTRACTION, BILATERAL     COLONOSCOPY WITH PROPOFOL N/A 12/28/2020   Procedure: COLONOSCOPY WITH PROPOFOL;  Surgeon: Franky Macho, MD;  Location: [facility];  Service: Gastroenterology;  Laterality: N/A;   ESOPHAGOGASTRODUODENOSCOPY (EGD) WITH PROPOFOL N/A 12/28/2020   Procedure: ESOPHAGOGASTRODUODENOSCOPY (EGD) WITH PROPOFOL;  Surgeon: Franky Macho, MD;  Location: [facility];  Service: Gastroenterology;  Laterality: N/A;   GASTRIC BYPASS   09/2003   LEFT HEART CATH AND CORONARY ANGIOGRAPHY N/A 07/18/2016   Procedure: Left Heart Cath and Coronary Angiography;  Surgeon: Lennette Bihari, MD;  Location: [facility];  Service: Cardiovascular;  Laterality: N/A;   TEE WITHOUT CARDIOVERSION N/A 03/19/2014   Procedure: TRANSESOPHAGEAL ECHOCARDIOGRAM (TEE);  Surgeon: Antoine Poche, MD;  Location: [facility];  Service: Endoscopy;  Laterality: N/A;   TEE WITHOUT CARDIOVERSION N/A 05/21/2018   Procedure: TRANSESOPHAGEAL ECHOCARDIOGRAM (TEE);  Surgeon: Antoine Poche, MD;  Location: [facility];  Service: Endoscopy;  Laterality: N/A;   TEE WITHOUT CARDIOVERSION N/A 01/29/2020   Procedure: TRANSESOPHAGEAL ECHOCARDIOGRAM (TEE) WITH PROPOFOL;  Surgeon: Antoine Poche, MD;  Location: [facility];  Service: Endoscopy;  Laterality: N/A;  scheduled by Mindi Junker @ Dr. Wilmon Arms office    Current Outpatient Medications  Medication Sig Dispense Refill   ARIPiprazole (ABILIFY) 10 MG tablet Take 10 mg by mouth at bedtime.      carvedilol (COREG) 3.125 MG tablet Take 6.25 mg by mouth 2 (two) times daily.  6   chlorhexidine (PERIDEX) 0.12 % solution 15 mLs by Mouth Rinse route 2 (two) times daily as needed (gum irritation).     DULoxetine (CYMBALTA) 20 MG capsule Take 20 mg by mouth 2 (two) times daily.     empagliflozin (JARDIANCE) 10 MG TABS tablet Take 1 tablet (10 mg total) by mouth daily before breakfast. 30 tablet 11   furosemide (LASIX) 40 MG tablet Take 80 mg by mouth daily.     Melatonin 10 MG TABS Take 20 mg by  mouth at bedtime.      meloxicam (MOBIC) 15 MG tablet Take 15 mg by mouth daily.     omeprazole (PRILOSEC) 20 MG capsule Take 20 mg by mouth daily before breakfast.      sacubitril-valsartan (ENTRESTO) 24-26 MG Take 1 tablet by mouth 2 (two) times daily. 180 tablet 3   spironolactone (ALDACTONE) 25 MG tablet Take 1/2 (one-half) tablet by mouth once daily 45 tablet 3   traZODone (DESYREL) 100 MG tablet Take 200 mg by mouth  at bedtime.      No current facility-administered medications for this visit.    Allergies:   Patient has no known allergies.   Social History: Social History   Socioeconomic History   Marital status: Married    Spouse name: Not on file   Number of children: Not on file   Years of education: Not on file   Highest education level: Not on file  Occupational History   Not on file  Tobacco Use   Smoking status: Never   Smokeless tobacco: Never  Vaping Use   Vaping Use: Never used  Substance and Sexual Activity   Alcohol use: Not Currently    Alcohol/week: 7.0 standard drinks    Types: 7 Cans of beer per week   Drug use: No   Sexual activity: Not on file  Other Topics Concern   Not on file  Social History Narrative   Not on file   Social Determinants of Health   Financial Resource Strain: Not on file  Food Insecurity: Not on file  Transportation Needs: Not on file  Physical Activity: Not on file  Stress: Not on file  Social Connections: Not on file  Intimate Partner Violence: Not on file    Family History: Family History  Problem Relation Age of Onset   Diabetes Mother    Hypertension Mother    Stroke Mother    Heart failure Mother    Cancer - Prostate Father    Arthritis-Osteo Sister    Hypertension Sister    Diabetes Sister    CAD Sister    Arthritis Sister    Hypertension Sister    Arthritis-Osteo Sister    Atrial fibrillation Sister    Cancer Sister    Pneumonia Sister    Osteoporosis Sister    Hypertension Sister    Mitral valve prolapse Sister    Arthritis-Osteo Sister    Fibromyalgia Sister    Depression Sister    Hypertension Brother    CAD Brother    Heart attack Brother    Hypertension Brother    CAD Brother    Heart attack Brother      Review of Systems: All other systems reviewed and are otherwise negative except as noted above.  Physical Exam: Vitals:   05/09/21 1057  BP: (!) 88/54  Pulse: (!) 102  SpO2: 99%  Weight: 290  lb 12.8 oz (131.9 kg)  Height: 5\' 5"  (1.651 m)     GEN- The patient is well appearing, alert and oriented x 3 today.   HEENT: normocephalic, atraumatic; sclera clear, conjunctiva pink; hearing intact; oropharynx clear; neck supple  Lungs- Clear to ausculation bilaterally, normal work of breathing.  No wheezes, rales, rhonchi Heart- Regular rate and rhythm, no murmurs, rubs or gallops  GI- soft, non-tender, non-distended, bowel sounds present  Extremities- no clubbing or cyanosis. No edema MS- no significant deformity or atrophy Skin- warm and dry, no rash or lesion; PPM pocket well healed Psych- euthymic mood,  full affect Neuro- strength and sensation are intact  PPM Interrogation- reviewed in detail today,  See PACEART report  EKG:  EKG is not ordered today.   Recent Labs: 12/24/2020: BUN 16; Creatinine, Ser 0.81; Potassium 4.3; Sodium 134 02/01/2021: Hemoglobin 10.8; Platelets 310   Wt Readings from Last 3 Encounters:  05/09/21 290 lb 12.8 oz (131.9 kg)  02/01/21 281 lb (127.5 kg)  12/24/20 260 lb (117.9 kg)     Other studies Reviewed: Additional studies/ records that were reviewed today include: Previous EP office notes, Previous remote checks, Most recent labwork.   Assessment and Plan:  1. CHB s/p Medtronic PPM  Normal PPM function See Pace Art report No changes today  2. Chronic systolic CHF Normalization of EF on GDMT per Echo 04/07/2021  3. H/o syncope Denies further No lightheadedness with low BP  4. Mild mod MR Functional component, has improved from mod+ to mild with improvement in EF  5. Atrial tach Burden 12.4% but ? Undersensing. A sensitivity programmed at most sensitive already.   Current medicines are reviewed at length with the patient today.     Disposition:   Follow up with EP APP in 6 months    Signed, Shirley Friar, PA-C  05/09/2021 12:12 PM  [address] [place]  [phone] (office) [phone] (fax)

## 2021-05-09 NOTE — Patient Instructions (Signed)

## 2021-05-10 ENCOUNTER — Ambulatory Visit (INDEPENDENT_AMBULATORY_CARE_PROVIDER_SITE_OTHER): Payer: 59

## 2021-05-10 ENCOUNTER — Telehealth: Payer: Self-pay

## 2021-05-10 DIAGNOSIS — I428 Other cardiomyopathies: Secondary | ICD-10-CM

## 2021-05-10 NOTE — Telephone Encounter (Signed)
LMOVM for patient to call the device clinic to receive help with her mobile phone app.  Left the device clinic direct office number.

## 2021-05-10 NOTE — Telephone Encounter (Signed)
-----   Message from Lenor Coffin, RN sent at 05/09/2021  1:14 PM EST -----  ----- Message ----- From: Graciella Freer, PA-C Sent: 05/09/2021  12:09 PM EST To: [facility]  Please call for troubleshooting for monitor, She brought to clinic today but had gotten password wrong so many times we were locked out.   Confirmed with ivan her password should be [address], [place]  05/09/2021 12:09 PM

## 2021-05-10 NOTE — Telephone Encounter (Signed)
I spoke with the patient and helped her send a manual transmission. Transmission received.

## 2021-05-11 LAB — CUP PACEART REMOTE DEVICE CHECK
Battery Remaining Longevity: 127 mo
Battery Voltage: 3.07 V
Brady Statistic AP VP Percent: 40.72 %
Brady Statistic AP VS Percent: 0 %
Brady Statistic AS VP Percent: 59.24 %
Brady Statistic AS VS Percent: 0.04 %
Brady Statistic RA Percent Paced: 44.07 %
Brady Statistic RV Percent Paced: 99.95 %
Date Time Interrogation Session: 20230131155813
Implantable Lead Implant Date: 20100201
Implantable Lead Implant Date: 20100201
Implantable Lead Implant Date: 20220706
Implantable Lead Location: 753858
Implantable Lead Location: 753859
Implantable Lead Location: 753860
Implantable Lead Model: 4598
Implantable Lead Model: 5076
Implantable Lead Model: 5076
Implantable Pulse Generator Implant Date: 20220706
Lead Channel Impedance Value: 342 Ohm
Lead Channel Impedance Value: 361 Ohm
Lead Channel Impedance Value: 380 Ohm
Lead Channel Impedance Value: 437 Ohm
Lead Channel Impedance Value: 437 Ohm
Lead Channel Impedance Value: 456 Ohm
Lead Channel Impedance Value: 456 Ohm
Lead Channel Impedance Value: 494 Ohm
Lead Channel Impedance Value: 494 Ohm
Lead Channel Impedance Value: 722 Ohm
Lead Channel Impedance Value: 741 Ohm
Lead Channel Impedance Value: 798 Ohm
Lead Channel Impedance Value: 817 Ohm
Lead Channel Impedance Value: 817 Ohm
Lead Channel Pacing Threshold Amplitude: 0.875 V
Lead Channel Pacing Threshold Pulse Width: 0.4 ms
Lead Channel Sensing Intrinsic Amplitude: 0.375 mV
Lead Channel Sensing Intrinsic Amplitude: 0.375 mV
Lead Channel Sensing Intrinsic Amplitude: 3.25 mV
Lead Channel Setting Pacing Amplitude: 1 V
Lead Channel Setting Pacing Amplitude: 2 V
Lead Channel Setting Pacing Amplitude: 2 V
Lead Channel Setting Pacing Pulse Width: 0.4 ms
Lead Channel Setting Pacing Pulse Width: 0.8 ms
Lead Channel Setting Sensing Sensitivity: 0.9 mV

## 2021-05-17 NOTE — Progress Notes (Signed)
Remote pacemaker transmission.   

## 2021-06-24 ENCOUNTER — Other Ambulatory Visit (HOSPITAL_COMMUNITY): Payer: Self-pay | Admitting: Family Medicine

## 2021-06-29 ENCOUNTER — Other Ambulatory Visit (HOSPITAL_COMMUNITY): Payer: Self-pay | Admitting: Family Medicine

## 2021-07-05 ENCOUNTER — Other Ambulatory Visit (HOSPITAL_COMMUNITY): Payer: Self-pay | Admitting: Family Medicine

## 2021-07-05 DIAGNOSIS — N640 Fissure and fistula of nipple: Secondary | ICD-10-CM

## 2021-07-12 ENCOUNTER — Ambulatory Visit (HOSPITAL_COMMUNITY)
Admission: RE | Admit: 2021-07-12 | Discharge: 2021-07-12 | Disposition: A | Discharge status: Home / Self Care | Payer: 59 | Source: Ambulatory Visit | Attending: Family Medicine | Admitting: Family Medicine

## 2021-07-12 DIAGNOSIS — N640 Fissure and fistula of nipple: Secondary | ICD-10-CM

## 2021-07-25 ENCOUNTER — Other Ambulatory Visit: Payer: Self-pay

## 2021-07-25 ENCOUNTER — Emergency Department (HOSPITAL_COMMUNITY): Payer: 59

## 2021-07-25 ENCOUNTER — Encounter (HOSPITAL_COMMUNITY): Payer: Self-pay | Admitting: *Deleted

## 2021-07-25 ENCOUNTER — Emergency Department (HOSPITAL_COMMUNITY)
Admission: EM | Admit: 2021-07-25 | Discharge: 2021-07-25 | Disposition: A | Discharge status: Home / Self Care | Payer: 59 | Attending: Emergency Medicine | Admitting: Emergency Medicine

## 2021-07-25 DIAGNOSIS — R790 Abnormal level of blood mineral: Secondary | ICD-10-CM | POA: Diagnosis not present

## 2021-07-25 DIAGNOSIS — D649 Anemia, unspecified: Secondary | ICD-10-CM | POA: Diagnosis not present

## 2021-07-25 DIAGNOSIS — I509 Heart failure, unspecified: Secondary | ICD-10-CM | POA: Diagnosis not present

## 2021-07-25 DIAGNOSIS — Z950 Presence of cardiac pacemaker: Secondary | ICD-10-CM | POA: Diagnosis not present

## 2021-07-25 DIAGNOSIS — R0602 Shortness of breath: Secondary | ICD-10-CM | POA: Diagnosis present

## 2021-07-25 LAB — CBC
HCT: 32.8 % — ABNORMAL LOW (ref 36.0–46.0)
Hemoglobin: 9.6 g/dL — ABNORMAL LOW (ref 12.0–15.0)
MCH: 23.2 pg — ABNORMAL LOW (ref 26.0–34.0)
MCHC: 29.3 g/dL — ABNORMAL LOW (ref 30.0–36.0)
MCV: 79.4 fL — ABNORMAL LOW (ref 80.0–100.0)
Platelets: 315 10*3/uL (ref 150–400)
RBC: 4.13 MIL/uL (ref 3.87–5.11)
RDW: 21.1 % — ABNORMAL HIGH (ref 11.5–15.5)
WBC: 10.1 10*3/uL (ref 4.0–10.5)
nRBC: 0 % (ref 0.0–0.2)

## 2021-07-25 LAB — BASIC METABOLIC PANEL
Anion gap: 8 (ref 5–15)
BUN: 19 mg/dL (ref 6–20)
CO2: 27 mmol/L (ref 22–32)
Calcium: 8.7 mg/dL — ABNORMAL LOW (ref 8.9–10.3)
Chloride: 105 mmol/L (ref 98–111)
Creatinine, Ser: 0.77 mg/dL (ref 0.44–1.00)
GFR, Estimated: >60 mL/min (ref >60)
Glucose, Bld: 98 mg/dL (ref 70–99)
Potassium: 4.1 mmol/L (ref 3.5–5.1)
Sodium: 140 mmol/L (ref 135–145)

## 2021-07-25 LAB — D-DIMER, QUANTITATIVE: D-Dimer, Quant: 1.2 ug/mL-FEU — ABNORMAL HIGH (ref 0.00–0.50)

## 2021-07-25 LAB — TROPONIN I (HIGH SENSITIVITY): Troponin I (High Sensitivity): 14 ng/L (ref <18)

## 2021-07-25 LAB — BRAIN NATRIURETIC PEPTIDE: B Natriuretic Peptide: 368 pg/mL — ABNORMAL HIGH (ref 0.0–100.0)

## 2021-07-25 MED ORDER — FUROSEMIDE 80 MG PO TABS: 80.0000 mg | ORAL_TABLET | Freq: Two times a day (BID) | ORAL | 0 refills | Status: DC

## 2021-07-25 MED ORDER — FUROSEMIDE 10 MG/ML IJ SOLN
80.0000 mg | Freq: Once | INTRAMUSCULAR | Status: AC
  Administered 2021-07-25: 80 mg via INTRAVENOUS
  Filled 2021-07-25: qty 8

## 2021-07-25 MED ORDER — IOHEXOL 300 MG/ML  SOLN
100.0000 mL | Freq: Once | INTRAMUSCULAR | Status: AC | PRN
  Administered 2021-07-25: 100 mL via INTRAVENOUS

## 2021-07-25 MED ORDER — POTASSIUM CHLORIDE ER 10 MEQ PO TBCR: 10.0000 meq | EXTENDED_RELEASE_TABLET | Freq: Every day | ORAL | 0 refills | Status: DC

## 2021-07-25 NOTE — ED Provider Notes (Signed)
?[facility] ?Provider Note ? ? ?CSN: [id number] ?Arrival date & time: 07/25/21  [id number] ? ?  ? ?History ? ?Chief Complaint  ?Patient presents with  ? Shortness of Breath  ? ? ?[name] [name] is a 61 y.o. female. ? ? ?Shortness of Breath ?Associated symptoms: no abdominal pain, no chest pain, no cough, no fever, no headaches, no neck pain and no vomiting   ? ?  ? ?[name] [name] is a 61 y.o. female with past medical history significant for CHF, atrial fibrillation, mitral valve regurg with pacemaker, and iron deficient anemia.  She presents to the Emergency Department complaining of gradually worsening shortness of breath and swelling of her bilateral feet and ankles.  Symptoms have been present for greater than 1 week.  She endorses orthopnea and shortness of breath is worse upon exertion.  Patient significant other states she has been wheezing some at night.  She has history of CHF and states that her current symptoms feel similar to previous episodes.  She takes 80 mg Lasix daily and has increased her dose to 140 x 2 days without improvement. ?Also, she states that she fell 1 week ago and bruised her right lower leg and ankle, states the area is healing.  She denies any pain of her calf or ankle. ? ?PCP is Dr. Quintin Alto ?Cardiology is Dr. Caryl Comes ? ?Home Medications ?Prior to Admission medications   ?Medication Sig Start Date End Date Taking? Authorizing Provider  ?ARIPiprazole (ABILIFY) 10 MG tablet Take 10 mg by mouth at bedtime.     [provider]  ?carvedilol (COREG) 3.125 MG tablet Take 6.25 mg by mouth 2 (two) times daily. 12/21/17   [provider]  ?chlorhexidine (PERIDEX) 0.12 % solution 15 mLs by Mouth Rinse route 2 (two) times daily as needed (gum irritation). 04/28/20   [provider]  ?DULoxetine (CYMBALTA) 20 MG capsule Take 20 mg by mouth 2 (two) times daily.    [provider]  ?empagliflozin (JARDIANCE) 10 MG TABS tablet Take 1 tablet (10 mg  total) by mouth daily before breakfast. 02/01/21   Deboraha Sprang, MD  ?furosemide (LASIX) 40 MG tablet Take 80 mg by mouth daily.    [provider]  ?Melatonin 10 MG TABS Take 20 mg by mouth at bedtime.     [provider]  ?meloxicam (MOBIC) 15 MG tablet Take 15 mg by mouth daily.    [provider]  ?omeprazole (PRILOSEC) 20 MG capsule Take 20 mg by mouth daily before breakfast.     [provider]  ?sacubitril-valsartan (ENTRESTO) 24-26 MG Take 1 tablet by mouth 2 (two) times daily. 03/21/21   Deboraha Sprang, MD  ?spironolactone (ALDACTONE) 25 MG tablet Take 1/2 (one-half) tablet by mouth once daily 11/25/20   Deboraha Sprang, MD  ?traZODone (DESYREL) 100 MG tablet Take 200 mg by mouth at bedtime.     [provider]  ?   ? ?Allergies    ?Patient has no known allergies.   ? ?Review of Systems   ?Review of Systems  ?Constitutional:  Negative for appetite change, fatigue and fever.  ?Respiratory:  Positive for chest tightness and shortness of breath. Negative for cough.   ?Cardiovascular:  Positive for leg swelling. Negative for chest pain.  ?Gastrointestinal:  Negative for abdominal pain, nausea and vomiting.  ?Genitourinary:  Negative for difficulty urinating and dysuria.  ?Musculoskeletal:  Negative for arthralgias, back pain and neck pain.  ?Skin:  Positive  for color change (Bruising right lower leg and ankle).  ?Neurological:  Negative for dizziness, syncope, weakness, numbness and headaches.  ?Psychiatric/Behavioral:  Negative for confusion.   ? ?Physical Exam ?Updated Vital Signs ?BP 114/79   Pulse (!) 59   Temp 98.1 ?F (36.7 ?C) (Oral)   Resp (!) 22   Ht 5\' 5"  (1.651 m)   Wt 131.5 kg   SpO2 99%   BMI 48.26 kg/m?  ?Physical Exam ?Vitals and nursing note reviewed.  ?Constitutional:   ?   Appearance: She is well-developed. She is obese.  ?HENT:  ?   Head: Atraumatic.  ?Cardiovascular:  ?   Rate and Rhythm: Normal rate and regular rhythm.  ?Pulmonary:  ?    Comments: Slight increased work of breathing during exam.  Lungs are clear to auscultation bilaterally. ?Chest:  ?   Chest wall: No tenderness.  ?Abdominal:  ?   Palpations: Abdomen is soft.  ?   Tenderness: There is no abdominal tenderness.  ?Musculoskeletal:  ?   Cervical back: Normal range of motion.  ?   Right lower leg: No tenderness. Edema present.  ?   Left lower leg: No tenderness. Edema present.  ?Skin: ?   General: Skin is warm.  ?   Capillary Refill: Capillary refill takes less than 2 seconds.  ?   Coloration: Skin is not pale.  ?   Findings: No erythema.  ?Neurological:  ?   General: No focal deficit present.  ?   Mental Status: She is alert.  ?   Motor: No weakness.  ?Psychiatric:     ?   Mood and Affect: Mood normal.  ? ? ?ED Results / Procedures / Treatments   ?Labs ?(all labs ordered are listed, but only abnormal results are displayed) ?Labs Reviewed  ?BASIC METABOLIC PANEL - Abnormal; Notable for the following components:  ?    Result Value  ? Calcium 8.7 (*)   ? All other components within normal limits  ?CBC - Abnormal; Notable for the following components:  ? Hemoglobin 9.6 (*)   ? HCT 32.8 (*)   ? MCV 79.4 (*)   ? MCH 23.2 (*)   ? MCHC 29.3 (*)   ? RDW 21.1 (*)   ? All other components within normal limits  ?BRAIN NATRIURETIC PEPTIDE - Abnormal; Notable for the following components:  ? B Natriuretic Peptide 368.0 (*)   ? All other components within normal limits  ?D-DIMER, QUANTITATIVE - Abnormal; Notable for the following components:  ? D-Dimer, Quant 1.20 (*)   ? All other components within normal limits  ?TROPONIN I (HIGH SENSITIVITY)  ?TROPONIN I (HIGH SENSITIVITY)  ? ? ?EKG ?EKG Interpretation ? ?Date/Time:  Monday July 25 2021 09:05:47 EDT ?Ventricular Rate:  80 ?PR Interval:    ?QRS Duration: 54 ?QT Interval:  446 ?QTC Calculation: 514 ?R Axis:   159 ?Text Interpretation: Ventricular-paced rhythm Abnormal ECG When compared with ECG of 24-Dec-2020 15:21, Vent. rate has increased BY  14  BPM Confirmed by Derwood Kaplan [phone]) on 07/25/2021 1:53:25 PM ? ?Radiology ?DG Chest 2 View ? ?Result Date: 07/25/2021 ?CLINICAL DATA:  Shortness of breath EXAM: CHEST - 2 VIEW COMPARISON:  10/13/2020 FINDINGS: Transverse diameter of heart is increased. Pacemaker battery is seen in the right infraclavicular region. Biventricular pacer leads are noted in place. There are no signs of alveolar pulmonary edema or focal pulmonary consolidation. There is no pleural effusion or pneumothorax. IMPRESSION: Cardiomegaly. There are no signs of pulmonary edema  or new focal infiltrates. Electronically Signed   By: Elmer Picker M.D.   On: 07/25/2021 09:34  ? ?CT Angio Chest PE W and/or Wo Contrast ? ?Result Date: 07/25/2021 ?CLINICAL DATA:  Pulmonary embolism (PE) suspected, positive D-dimer EXAM: CT ANGIOGRAPHY CHEST WITH CONTRAST TECHNIQUE: Multidetector CT imaging of the chest was performed using the standard protocol during bolus administration of intravenous contrast. Multiplanar CT image reconstructions and MIPs were obtained to evaluate the vascular anatomy. RADIATION DOSE REDUCTION: This exam was performed according to the departmental dose-optimization program which includes automated exposure control, adjustment of the mA and/or kV according to patient size and/or use of iterative reconstruction technique. CONTRAST:  175mL OMNIPAQUE IOHEXOL 300 MG/ML  SOLN COMPARISON:  None. FINDINGS: Cardiovascular: Satisfactory opacification of the pulmonary arteries to the segmental level. No evidence of pulmonary embolism. Enlarged main and branch pulmonary arteries. Cardiomegaly with pacemaker leads in place. Minimal atherosclerosis of the aortic arch. Mild reflux of contrast into the IVC and hepatic veins. Mediastinum/Nodes: No lymphadenopathy. The thyroid is unremarkable. The esophagus is unremarkable. Lungs/Pleura: Mosaic attenuation of the lungs with mild interlobular septal thickening. Small right pleural effusion. No  pneumothorax. Upper Abdomen: Prior gastric bypass.  No acute abnormality. Musculoskeletal: No acute osseous abnormality. No suspicious lytic or blastic lesions. Review of the MIP images confirms the above finding

## 2021-07-25 NOTE — Discharge Instructions (Signed)
As discussed, increase your Lasix dose to 80 mg twice daily.  You have also been prescribed potassium supplement to take as well.  Please call Dr. Olin Pia office to arrange a follow-up appointment.  Return to the emergency department for any new or worsening symptoms. ?

## 2021-07-25 NOTE — ED Triage Notes (Signed)
Pt c/o sob and bilateral leg and feet swelling for the last couple of days; pt states she was hospitalized x 2 years ago with CHF when she had these sx last ?

## 2021-07-29 ENCOUNTER — Encounter: Payer: Self-pay | Admitting: Cardiology

## 2021-07-29 ENCOUNTER — Ambulatory Visit (INDEPENDENT_AMBULATORY_CARE_PROVIDER_SITE_OTHER): Payer: 59 | Admitting: Cardiology

## 2021-07-29 VITALS — BP 115/75 | HR 62 | Ht 65.0 in | Wt 303.0 lb

## 2021-07-29 DIAGNOSIS — I340 Nonrheumatic mitral (valve) insufficiency: Secondary | ICD-10-CM | POA: Diagnosis not present

## 2021-07-29 DIAGNOSIS — I5033 Acute on chronic diastolic (congestive) heart failure: Secondary | ICD-10-CM

## 2021-07-29 MED ORDER — TORSEMIDE 20 MG PO TABS: 40.0000 mg | ORAL_TABLET | Freq: Two times a day (BID) | ORAL | 3 refills | Status: DC

## 2021-07-29 NOTE — Patient Instructions (Addendum)
Medication Instructions:  ?Stop Lasix ?Start Torsemide 40 mg tablets twice daily ? ?Labwork: ?IN 2 WEEKS AT [facility]: ?BMET/MAG ? ?Testing/Procedures: ?None ? ?Follow-Up: ?Follow up with Dr. Wyline Mood or APP in 3 months.  ? ?Any Other Special Instructions Will Be Listed Below (If Applicable). ?Update our office on weights this coming Wednesday.  ? ? ? ?If you need a refill on your cardiac medications before your next appointment, please call your pharmacy. ? ?

## 2021-07-29 NOTE — Progress Notes (Signed)
? ? ? ?Clinical Summary ?[name] [name] is a 61 y.o.female seen today for follow up of the following medical problems.  ?  ?1. History of tachycardia induced cardiomyopathy ?- s/p AV nodal ablation, now with permanent pacemaker ?-  echo 01/2014 (done at [facility], disc indepedently reviewed), mildly dilated LV (LVIDs 4.4), LVEF 50-55%, mod to severe LAE, mod to severe MR, PASP 54. Diastolic function not described ? ?03/2021 echo LVEF 50-55%, mild MR, normal diastolic function ? ?- ER visit 07/25/21 with SOB, LE edema. +orthopnea.  ?- had bee no lasix 80mg  daily, increased to up to 140mg  in a day without improvement ?- Hgb 9.6, K 4.1 Cr 0.77, BNP 368, Ddimer 1.2, trop 14 ?CXR cardiomegaly, no edema ?CT PE +pulm edema, small right pleural effusion ?- given IV lasix with improved symptoms ? ?- lasix taking 80mg  bid, reports less response ?- ongoing edema though improving. Ongoing SOB, orthopnea improved to some degree.  ?- limiting sodium intake ? ? ?  ?2. Permanent pacemaker ?- device check 09/2013 normal function ?- denies any lightheadedness or dizziness ?- Jan 2023 normal device ?  ?3. Mitral regurgitation ?- moderate to severe by last echo 01/2014. TEE 03/2014 mild to moderate MR ?- SOB and edema have improved since increasing lasix.  ? ?2020 TEE: mod MR, mild prolapse ?2021 TEE mod MR ?03/2021 echo LVEF 50-55%, mild MR ? ? ?4. Anemia ?- symptoms improved with IV iron, B12 injection by pcp ?- blood counts have normalized. Followed by pcp ?  ?5. Syncope ?- admitted 03/2014 to [facility] ?- occurded while at work. Sudden onset of feeling very hot all over, mild nausea. 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 Sat down and put head down, short loss of conscioussness. No chest pain, no palpitations. - ?- from discharge notes normal orthostatics, pacemaker interrogation during admission was normal. Thought to be vasovagal syncope due to described prodrome. Reports high levels of stress at that the time that were work  related.  ? ?- no recent issues ?Past Medical History:  ?Diagnosis Date  ? Anemia   ? Atrial fibrillation ([place])   ? Chronic diastolic heart failure ([place])   ? EF 40-45%  ? Depression   ? history of depression in the past.  ? Dysrhythmia   ? GERD (gastroesophageal reflux disease)   ? Mitral valvular regurgitation   ? Pacemaker- medtronic   ? Initial PM 1995; generator 2009, infection with contralateral implant 2010  ? S/P AV nodal ablation   ? Shortness of breath dyspnea   ? ? ? ?No Known Allergies ? ? ?Current Outpatient Medications  ?Medication Sig Dispense Refill  ? ARIPiprazole (ABILIFY) 10 MG tablet Take 10 mg by mouth at bedtime.  (Patient not taking: Reported on 07/25/2021)    ? carvedilol (COREG) 3.125 MG tablet Take 6.25 mg by mouth 2 (two) times daily.  6  ? chlorhexidine (PERIDEX) 0.12 % solution 15 mLs by Mouth Rinse route 2 (two) times daily as needed (gum irritation). (Patient not taking: Reported on 07/25/2021)    ? DULoxetine (CYMBALTA) 20 MG capsule Take 20 mg by mouth 2 (two) times daily.    ? empagliflozin (JARDIANCE) 10 MG TABS tablet Take 1 tablet (10 mg total) by mouth daily before breakfast. 30 tablet 11  ? furosemide (LASIX) 80 MG tablet Take 1 tablet (80 mg total) by mouth 2 (two) times daily. 60 tablet 0  ? Melatonin 10 MG TABS Take 20 mg by mouth at bedtime.     ?  meloxicam (MOBIC) 15 MG tablet Take 15 mg by mouth daily.    ? omeprazole (PRILOSEC) 20 MG capsule Take 20 mg by mouth daily before breakfast.     ? potassium chloride (KLOR-CON) 10 MEQ tablet Take 1 tablet (10 mEq total) by mouth daily. 30 tablet 0  ? sacubitril-valsartan (ENTRESTO) 24-26 MG Take 1 tablet by mouth 2 (two) times daily. 180 tablet 3  ? spironolactone (ALDACTONE) 25 MG tablet Take 1/2 (one-half) tablet by mouth once daily 45 tablet 3  ? traZODone (DESYREL) 100 MG tablet Take 200 mg by mouth at bedtime.     ? ?No current facility-administered medications for this visit.  ? ? ? ?Past Surgical History:  ?Procedure  Laterality Date  ? ABDOMINAL ADHESION SURGERY  04/2006  ? BIV UPGRADE N/A 10/13/2020  ? Procedure: BIV UPGRADE;  Surgeon: Deboraha Sprang, MD;  Location: [facility];  Service: Cardiovascular;  Laterality: N/A;  ? CARDIAC CATHETERIZATION  09/11/2007  ? [place]  ? [place]  ? CATARACT EXTRACTION, BILATERAL    ? COLONOSCOPY WITH PROPOFOL N/A 12/28/2020  ? Procedure: COLONOSCOPY WITH PROPOFOL;  Surgeon: Aviva Signs, MD;  [facility];  Service: Gastroenterology;  Laterality: N/A;  ? ESOPHAGOGASTRODUODENOSCOPY (EGD) WITH PROPOFOL N/A 12/28/2020  ? Procedure: ESOPHAGOGASTRODUODENOSCOPY (EGD) WITH PROPOFOL;  Surgeon: Aviva Signs, MD;  [facility];  Service: Gastroenterology;  Laterality: N/A;  ? GASTRIC BYPASS  09/2003  ? LEFT HEART CATH AND CORONARY ANGIOGRAPHY N/A 07/18/2016  ? Procedure: Left Heart Cath and Coronary Angiography;  Surgeon: Troy Sine, MD;  Location: [facility];  Service: Cardiovascular;  Laterality: N/A;  ? TEE WITHOUT CARDIOVERSION N/A 03/19/2014  ? Procedure: TRANSESOPHAGEAL ECHOCARDIOGRAM (TEE);  Surgeon: Arnoldo Lenis, MD;  [facility];  Service: Endoscopy;  Laterality: N/A;  ? TEE WITHOUT CARDIOVERSION N/A 05/21/2018  ? Procedure: TRANSESOPHAGEAL ECHOCARDIOGRAM (TEE);  Surgeon: Arnoldo Lenis, MD;  [facility];  Service: Endoscopy;  Laterality: N/A;  ? TEE WITHOUT CARDIOVERSION N/A 01/29/2020  ? Procedure: TRANSESOPHAGEAL ECHOCARDIOGRAM (TEE) WITH PROPOFOL;  Surgeon: Arnoldo Lenis, MD;  [facility];  Service: Endoscopy;  Laterality: N/A;  scheduled by [facility] @ Dr. Danie Chandler office  ? ? ? ?No Known Allergies ? ? ? ?Family History  ?Problem Relation Age of Onset  ? Diabetes Mother   ? Hypertension Mother   ? Stroke Mother   ? Heart failure Mother   ? Cancer - Prostate Father   ? Arthritis-Osteo Sister   ? Hypertension Sister   ? Diabetes Sister   ?  CAD Sister   ? Arthritis Sister   ? Hypertension Sister   ? Arthritis-Osteo Sister   ? Atrial fibrillation Sister   ? Cancer Sister   ? Pneumonia Sister   ? Osteoporosis Sister   ? Hypertension Sister   ? Mitral valve prolapse Sister   ? Arthritis-Osteo Sister   ? Fibromyalgia Sister   ? Depression Sister   ? Hypertension Brother   ? CAD Brother   ? Heart attack Brother   ? Hypertension Brother   ? CAD Brother   ? Heart attack Brother   ? ? ? ?Social History ?[name] [name] reports that she has never smoked. She has never used smokeless tobacco. ?[name] [name] reports that she does not currently use alcohol after a past usage of about 7.0 standard drinks per week. ? ? ?Review of Systems ?  CONSTITUTIONAL: No weight loss, fever, chills, weakness or fatigue.  ?HEENT: Eyes: No visual loss, blurred vision, double vision or yellow sclerae.No hearing loss, sneezing, congestion, runny nose or sore throat.  ?SKIN: No rash or itching.  ?CARDIOVASCULAR: per hpi ?RESPIRATORY: per hpi ?GASTROINTESTINAL: No anorexia, nausea, vomiting or diarrhea. No abdominal pain or blood.  ?GENITOURINARY: No burning on urination, no polyuria ?NEUROLOGICAL: No headache, dizziness, syncope, paralysis, ataxia, numbness or tingling in the extremities. No change in bowel or bladder control.  ?MUSCULOSKELETAL: No muscle, back pain, joint pain or stiffness.  ?LYMPHATICS: No enlarged nodes. No history of splenectomy.  ?PSYCHIATRIC: No history of depression or anxiety.  ?ENDOCRINOLOGIC: No reports of sweating, cold or heat intolerance. No polyuria or polydipsia.  ?. ? ? ?Physical Examination ?Today's Vitals  ? 07/29/21 1157  ?BP: 115/75  ?Pulse: 62  ?SpO2: 97%  ?Weight: (!) 303 lb (137.4 kg)  ?Height: 5\' 5"  (1.651 m)  ? ?Body mass index is 50.42 kg/m?. ? ?Gen: resting comfortably, no acute distress ?HEENT: no scleral icterus, pupils equal round and reactive, no palptable cervical adenopathy,  ?CV: RRR, no m/rg, no jvd ?Resp: Clear to auscultation  bilaterally ?GI: abdomen is soft, non-tender, non-distended, normal bowel sounds, no hepatosplenomegaly ?MSK: extremities are warm, 1+ bilateral LE edema ?Skin: warm, no rash ?Neuro:  no focal deficits ?Psych: appropriate af

## 2021-08-03 ENCOUNTER — Telehealth: Payer: Self-pay | Admitting: Cardiology

## 2021-08-03 NOTE — Telephone Encounter (Signed)
Pt states that [place] wanted her to call in with weight. Pt states that from Saturday she has loss 8.2 lbs. Pt's current weight is 290.2 lbs. Please advise ?

## 2021-08-05 NOTE — Telephone Encounter (Signed)
Follow Up: ? ? ? ? ?This pt is calling asking to speak to [place]. ?

## 2021-08-05 NOTE — Telephone Encounter (Signed)
Continue the current dose of fluid pill, can she go ahead and get the labs done Monday and udpate [place] on weights again Monday. Im documenting todays reported weight of 290 lbs for future reference.  ? ? ?Dominga Ferry MD ?

## 2021-08-05 NOTE — Telephone Encounter (Signed)
Left a message for pt to call office back regarding recommendations from provider.  ?

## 2021-08-05 NOTE — Telephone Encounter (Signed)
Pt notified and verbalized understanding. Pt will update office Monday on weights over the weekend.  Pt had no other questions or concerns at this time.  ?

## 2021-08-05 NOTE — Telephone Encounter (Signed)
Sent message to pt via mychart

## 2021-08-09 ENCOUNTER — Ambulatory Visit (INDEPENDENT_AMBULATORY_CARE_PROVIDER_SITE_OTHER): Payer: 59

## 2021-08-09 DIAGNOSIS — I428 Other cardiomyopathies: Secondary | ICD-10-CM

## 2021-08-09 DIAGNOSIS — I5032 Chronic diastolic (congestive) heart failure: Secondary | ICD-10-CM | POA: Diagnosis not present

## 2021-08-09 LAB — BASIC METABOLIC PANEL
BUN/Creatinine Ratio: 21 (ref 12–28)
BUN: 21 mg/dL (ref 8–27)
CO2: 29 mmol/L (ref 20–29)
Calcium: 9.5 mg/dL (ref 8.7–10.3)
Chloride: 97 mmol/L (ref 96–106)
Creatinine, Ser: 1.02 mg/dL — ABNORMAL HIGH (ref 0.57–1.00)
Glucose: 128 mg/dL — ABNORMAL HIGH (ref 70–99)
Potassium: 4.4 mmol/L (ref 3.5–5.2)
Sodium: 141 mmol/L (ref 134–144)
eGFR: 63 mL/min/{1.73_m2} (ref >59)

## 2021-08-09 LAB — MAGNESIUM: Magnesium: 2.3 mg/dL (ref 1.6–2.3)

## 2021-08-10 LAB — CUP PACEART REMOTE DEVICE CHECK
Battery Remaining Longevity: 117 mo
Battery Voltage: 3.04 V
Brady Statistic AP VP Percent: 34.42 %
Brady Statistic AP VS Percent: 0 %
Brady Statistic AS VP Percent: 65.53 %
Brady Statistic AS VS Percent: 0.05 %
Brady Statistic RA Percent Paced: 38.05 %
Brady Statistic RV Percent Paced: 99.95 %
Date Time Interrogation Session: 20230503100733
Implantable Lead Implant Date: 20100201
Implantable Lead Implant Date: 20100201
Implantable Lead Implant Date: 20220706
Implantable Lead Location: 753858
Implantable Lead Location: 753859
Implantable Lead Location: 753860
Implantable Lead Model: 4598
Implantable Lead Model: 5076
Implantable Lead Model: 5076
Implantable Pulse Generator Implant Date: 20220706
Lead Channel Impedance Value: 342 Ohm
Lead Channel Impedance Value: 361 Ohm
Lead Channel Impedance Value: 361 Ohm
Lead Channel Impedance Value: 418 Ohm
Lead Channel Impedance Value: 437 Ohm
Lead Channel Impedance Value: 456 Ohm
Lead Channel Impedance Value: 475 Ohm
Lead Channel Impedance Value: 494 Ohm
Lead Channel Impedance Value: 532 Ohm
Lead Channel Impedance Value: 722 Ohm
Lead Channel Impedance Value: 722 Ohm
Lead Channel Impedance Value: 798 Ohm
Lead Channel Impedance Value: 817 Ohm
Lead Channel Impedance Value: 836 Ohm
Lead Channel Pacing Threshold Amplitude: 1.125 V
Lead Channel Pacing Threshold Pulse Width: 0.4 ms
Lead Channel Sensing Intrinsic Amplitude: 0.75 mV
Lead Channel Sensing Intrinsic Amplitude: 0.75 mV
Lead Channel Sensing Intrinsic Amplitude: 3.25 mV
Lead Channel Setting Pacing Amplitude: 1 V
Lead Channel Setting Pacing Amplitude: 2 V
Lead Channel Setting Pacing Amplitude: 2.25 V
Lead Channel Setting Pacing Pulse Width: 0.4 ms
Lead Channel Setting Pacing Pulse Width: 0.8 ms
Lead Channel Setting Sensing Sensitivity: 0.9 mV

## 2021-08-15 ENCOUNTER — Telehealth: Payer: Self-pay

## 2021-08-15 DIAGNOSIS — Z79899 Other long term (current) drug therapy: Secondary | ICD-10-CM

## 2021-08-15 NOTE — Telephone Encounter (Signed)
-----   Message from Antoine Poche, MD sent at 08/15/2021  3:40 PM EDT ----- ?Slight decrease in kidney function expected due to strong diuretic dosing. Would continue for now, repeat bmet 2 weeks ? ?Dominga Ferry MD ?

## 2021-08-15 NOTE — Telephone Encounter (Signed)
Patient notified and verbalized understanding. Pt had no questions or concerns at this time. Pt agreeable with having BMET repeated in 2 weeks.  ?

## 2021-08-23 NOTE — Progress Notes (Signed)
Remote pacemaker transmission.   

## 2021-09-06 ENCOUNTER — Encounter: Payer: Self-pay | Admitting: Cardiology

## 2021-09-06 ENCOUNTER — Other Ambulatory Visit: Payer: Self-pay

## 2021-09-06 DIAGNOSIS — Z79899 Other long term (current) drug therapy: Secondary | ICD-10-CM

## 2021-09-08 LAB — BASIC METABOLIC PANEL
BUN/Creatinine Ratio: 16 (ref 12–28)
BUN: 14 mg/dL (ref 8–27)
CO2: 27 mmol/L (ref 20–29)
Calcium: 9.2 mg/dL (ref 8.7–10.3)
Chloride: 99 mmol/L (ref 96–106)
Creatinine, Ser: 0.88 mg/dL (ref 0.57–1.00)
Glucose: 108 mg/dL — ABNORMAL HIGH (ref 70–99)
Potassium: 4.8 mmol/L (ref 3.5–5.2)
Sodium: 141 mmol/L (ref 134–144)
eGFR: 75 mL/min/{1.73_m2} (ref >59)

## 2021-09-14 ENCOUNTER — Telehealth: Payer: Self-pay

## 2021-09-14 NOTE — Telephone Encounter (Signed)
-----   Message from Arnoldo Lenis, MD sent at 09/14/2021  3:49 PM EDT ----- Normal labs  Zandra Abts MD

## 2021-09-14 NOTE — Telephone Encounter (Signed)
Patient notified and verbalized understanding. Patient had no questions or concerns at this time. PCP copied 

## 2021-10-31 ENCOUNTER — Encounter: Payer: Self-pay | Admitting: Cardiology

## 2021-10-31 ENCOUNTER — Ambulatory Visit: Payer: 59 | Admitting: Cardiology

## 2021-10-31 VITALS — BP 120/80 | HR 81 | Ht 65.0 in | Wt 293.0 lb

## 2021-10-31 DIAGNOSIS — I5032 Chronic diastolic (congestive) heart failure: Secondary | ICD-10-CM

## 2021-10-31 NOTE — Progress Notes (Signed)
Clinical Summary Ms. Mckenney is a 61 y.o.female seen today for follow up of the following medical problems.    1. History of tachycardia induced cardiomyopathy/HFimpEF - s/p AV nodal ablation, now with permanent pacemaker -  echo 01/2014 (done at [facility], disc indepedently reviewed), mildly dilated LV (LVIDs 4.4), LVEF 50-55%, mod to severe LAE, mod to severe MR, PASP 54. Diastolic function not described   03/2021 echo LVEF 50-55%, mild MR, normal diastolic function   - ER visit 07/25/21 with SOB, LE edema. +orthopnea.  - had bee no lasix 80mg  daily, increased to up to 140mg  in a day without improvement - Hgb 9.6, K 4.1 Cr 0.77, BNP 368, Ddimer 1.2, trop 14 CXR cardiomegaly, no edema CT PE +pulm edema, small right pleural effusion - given IV lasix with improved symptoms       - seen 07/2021 with fluid overload, changed her lasix to  torsemide 40mg  bid.  - phone call 08/03/21 down 8 lbs to 290 lbs - f/u labs were normal - by our scales down 10 lbs. Home scale 293 lbs.     Other medical issues not addressed this visit     2. Permanent pacemaker/Tachy brady - device check 09/2013 normal function - denies any lightheadedness or dizziness - Jan 2023 normal device  -from notes low CHADS2Vasc score was not on anticoag over the years - recent device checks have only shown atach   3. Mitral regurgitation - moderate to severe by echo 01/2014. TEE 03/2014 mild to moderate MR - SOB and edema have improved since increasing lasix.    2020 TEE: mod MR, mild prolapse 2021 TEE mod MR 03/2021 echo LVEF 50-55%, mild MR     4. Anemia - symptoms improved with IV iron, B12 injection by pcp - blood counts have normalized. Followed by pcp   5. Syncope - admitted 03/2014 to [facility] - occurded while at work. Sudden onset of feeling very hot all over, mild nausea. 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 Sat down and put head down, short loss of conscioussness. No chest pain, no  palpitations. - - from discharge notes normal orthostatics, pacemaker interrogation during admission was normal. Thought to be vasovagal syncope due to described prodrome. Reports high levels of stress at that the time that were work related.    - no recent issues Past Medical History:  Diagnosis Date   Anemia    Atrial fibrillation ([facility])    Chronic diastolic heart failure ([facility])    EF 40-45%   Depression    history of depression in the past.   Dysrhythmia    GERD (gastroesophageal reflux disease)    Mitral valvular regurgitation    Pacemaker- medtronic    Initial PM 1995; generator 2009, infection with contralateral implant 2010   S/P AV nodal ablation    Shortness of breath dyspnea      No Known Allergies   Current Outpatient Medications  Medication Sig Dispense Refill   ARIPiprazole (ABILIFY) 10 MG tablet Take 10 mg by mouth at bedtime.  (Patient not taking: Reported on 07/25/2021)     carvedilol (COREG) 3.125 MG tablet Take 6.25 mg by mouth 2 (two) times daily.  6   chlorhexidine (PERIDEX) 0.12 % solution 15 mLs by Mouth Rinse route 2 (two) times daily as needed (gum irritation). (Patient not taking: Reported on 07/25/2021)     DULoxetine (CYMBALTA) 20 MG capsule Take 20 mg by mouth 2 (two) times daily.     empagliflozin (  JARDIANCE) 10 MG TABS tablet Take 1 tablet (10 mg total) by mouth daily before breakfast. 30 tablet 11   Melatonin 10 MG TABS Take 20 mg by mouth at bedtime.      meloxicam (MOBIC) 15 MG tablet Take 15 mg by mouth daily.     omeprazole (PRILOSEC) 20 MG capsule Take 20 mg by mouth daily before breakfast.  (Patient not taking: Reported on 07/29/2021)     potassium chloride (KLOR-CON) 10 MEQ tablet Take 1 tablet (10 mEq total) by mouth daily. 30 tablet 0   sacubitril-valsartan (ENTRESTO) 24-26 MG Take 1 tablet by mouth 2 (two) times daily. 180 tablet 3   spironolactone (ALDACTONE) 25 MG tablet Take 1/2 (one-half) tablet by mouth once daily 45 tablet 3   torsemide  (DEMADEX) 20 MG tablet Take 2 tablets (40 mg total) by mouth 2 (two) times daily. 360 tablet 3   traZODone (DESYREL) 100 MG tablet Take 200 mg by mouth at bedtime.      No current facility-administered medications for this visit.     Past Surgical History:  Procedure Laterality Date   ABDOMINAL ADHESION SURGERY  04/2006   BIV UPGRADE N/A 10/13/2020   Procedure: BIV UPGRADE;  Surgeon: [name], MD;  Location: [facility];  Service: Cardiovascular;  Laterality: N/A;   CARDIAC CATHETERIZATION  09/11/2007   CARDIAC PACEMAKER PLACEMENT  1995   [facility],Medtronic Adapta [id number]   CATARACT EXTRACTION, BILATERAL     COLONOSCOPY WITH PROPOFOL N/A 12/28/2020   Procedure: COLONOSCOPY WITH PROPOFOL;  Surgeon: Franky Macho, MD;  [facility];  Service: Gastroenterology;  Laterality: N/A;   ESOPHAGOGASTRODUODENOSCOPY (EGD) WITH PROPOFOL N/A 12/28/2020   Procedure: ESOPHAGOGASTRODUODENOSCOPY (EGD) WITH PROPOFOL;  Surgeon: Franky Macho, MD;  [facility];  Service: Gastroenterology;  Laterality: N/A;   GASTRIC BYPASS  09/2003   LEFT HEART CATH AND CORONARY ANGIOGRAPHY N/A 07/18/2016   Procedure: Left Heart Cath and Coronary Angiography;  Surgeon: Lennette Bihari, MD;  Location: [facility];  Service: Cardiovascular;  Laterality: N/A;   TEE WITHOUT CARDIOVERSION N/A 03/19/2014   Procedure: TRANSESOPHAGEAL ECHOCARDIOGRAM (TEE);  Surgeon: Antoine Poche, MD;  [facility];  Service: Endoscopy;  Laterality: N/A;   TEE WITHOUT CARDIOVERSION N/A 05/21/2018   Procedure: TRANSESOPHAGEAL ECHOCARDIOGRAM (TEE);  Surgeon: Antoine Poche, MD;  [facility];  Service: Endoscopy;  Laterality: N/A;   TEE WITHOUT CARDIOVERSION N/A 01/29/2020   Procedure: TRANSESOPHAGEAL ECHOCARDIOGRAM (TEE) WITH PROPOFOL;  Surgeon: Antoine Poche, MD;  [facility];  Service: Endoscopy;  Laterality: N/A;  scheduled by [facility] @ Dr. Wilmon Arms  office     No Known Allergies    Family History  Problem Relation Age of Onset   Diabetes Mother    Hypertension Mother    Stroke Mother    Heart failure Mother    Cancer - Prostate Father    Arthritis-Osteo Sister    Hypertension Sister    Diabetes Sister    CAD Sister    Arthritis Sister    Hypertension Sister    Arthritis-Osteo Sister    Atrial fibrillation Sister    Cancer Sister    Pneumonia Sister    Osteoporosis Sister    Hypertension Sister    Mitral valve prolapse Sister    Arthritis-Osteo Sister    Fibromyalgia Sister    Depression Sister    Hypertension Brother    CAD Brother    Heart  attack Brother    Hypertension Brother    CAD Brother    Heart attack Brother      Social History Ms. Biskup reports that she has never smoked. She has never used smokeless tobacco. Ms. Chilcott reports that she does not currently use alcohol after a past usage of about 7.0 standard drinks of alcohol per week.   Review of Systems CONSTITUTIONAL: No weight loss, fever, chills, weakness or fatigue.  HEENT: Eyes: No visual loss, blurred vision, double vision or yellow sclerae.No hearing loss, sneezing, congestion, runny nose or sore throat.  SKIN: No rash or itching.  CARDIOVASCULAR: per hpi RESPIRATORY: No shortness of breath, cough or sputum.  GASTROINTESTINAL: No anorexia, nausea, vomiting or diarrhea. No abdominal pain or blood.  GENITOURINARY: No burning on urination, no polyuria NEUROLOGICAL: No headache, dizziness, syncope, paralysis, ataxia, numbness or tingling in the extremities. No change in bowel or bladder control.  MUSCULOSKELETAL: No muscle, back pain, joint pain or stiffness.  LYMPHATICS: No enlarged nodes. No history of splenectomy.  PSYCHIATRIC: No history of depression or anxiety.  ENDOCRINOLOGIC: No reports of sweating, cold or heat intolerance. No polyuria or polydipsia.  Marland Kitchen   Physical Examination Today's Vitals   10/31/21 1555  BP: 120/80   Pulse: 81  SpO2: 96%  Weight: 293 lb (132.9 kg)  Height: 5\' 5"  (1.651 m)   Body mass index is 48.76 kg/m.  Gen: resting comfortably, no acute distress HEENT: no scleral icterus, pupils equal round and reactive, no palptable cervical adenopathy,  CV: RRR, no m/r/g no jvd Resp: Clear to auscultation bilaterally GI: abdomen is soft, non-tender, non-distended, normal bowel sounds, no hepatosplenomegaly MSK: extremities are warm, no edema.  Skin: warm, no rash Neuro:  no focal deficits Psych: appropriate affect   Diagnostic Studies  01/2020 TEE IMPRESSIONS     1. Left ventricular ejection fraction, by estimation, is 50%. The left  ventricle has normal function.   2. Right ventricular systolic function is normal. The right ventricular  size is normal.   3. Left atrial size was enlarged. No left atrial/left atrial appendage  thrombus was detected. The LAA emptying velocity was 60 cm/s.   4. Atrial septum is aneurysmal without a clear PFO.   5. Right atrial size was enlarged.   6. Moderate mitral regurgitation. Two separate MR jets that by composite  are consistent with moderate regurgitation. . The mitral valve is  abnormal. Moderate mitral valve regurgitation. No evidence of mitral  stenosis.   7. Tricuspid valve regurgitation is moderate.   8. The aortic valve is tricuspid. Aortic valve regurgitation is not  visualized. No aortic stenosis is present.      03/2021 echo   1. Left ventricular ejection fraction, by estimation, is 50 to 55%. The  left ventricle has low normal function. The left ventricle has no regional  wall motion abnormalities. Left ventricular diastolic parameters were  normal.   2. Right ventricular systolic function is normal. The right ventricular  size is normal. There is mildly elevated pulmonary artery systolic  pressure.   3. The mitral valve is abnormal. Mild mitral valve regurgitation. No  evidence of mitral stenosis.   4. The aortic valve is  tricuspid. Aortic valve regurgitation is not  visualized. No aortic stenosis is present.   5. The inferior vena cava is normal in size with greater than 50%  respiratory variability, suggesting right atrial pressure of 3 mmHg.    Assessment and Plan  1. History of tachycardia induced CM/HFpEF -  LVEF improved to low normal by last echo -down 10 lbs with improved edema and breathing since changing lasix to torsemide 40mg  bid last visit - labs show stable renal function and electrolytes - continue current meds   F/u 6 months      , M.D.

## 2021-10-31 NOTE — Patient Instructions (Signed)

## 2021-11-08 ENCOUNTER — Ambulatory Visit (INDEPENDENT_AMBULATORY_CARE_PROVIDER_SITE_OTHER): Payer: 59

## 2021-11-08 DIAGNOSIS — I428 Other cardiomyopathies: Secondary | ICD-10-CM | POA: Diagnosis not present

## 2021-11-08 LAB — CUP PACEART REMOTE DEVICE CHECK
Battery Remaining Longevity: 123 mo
Battery Voltage: 3.02 V
Brady Statistic AP VP Percent: 37.5 %
Brady Statistic AP VS Percent: 0 %
Brady Statistic AS VP Percent: 62.48 %
Brady Statistic AS VS Percent: 0.03 %
Brady Statistic RA Percent Paced: 40.8 %
Brady Statistic RV Percent Paced: 99.98 %
Date Time Interrogation Session: 20230731204339
Implantable Lead Implant Date: 20100201
Implantable Lead Implant Date: 20100201
Implantable Lead Implant Date: 20220706
Implantable Lead Location: 753858
Implantable Lead Location: 753859
Implantable Lead Location: 753860
Implantable Lead Model: 4598
Implantable Lead Model: 5076
Implantable Lead Model: 5076
Implantable Pulse Generator Implant Date: 20220706
Lead Channel Impedance Value: 361 Ohm
Lead Channel Impedance Value: 380 Ohm
Lead Channel Impedance Value: 399 Ohm
Lead Channel Impedance Value: 456 Ohm
Lead Channel Impedance Value: 456 Ohm
Lead Channel Impedance Value: 513 Ohm
Lead Channel Impedance Value: 513 Ohm
Lead Channel Impedance Value: 551 Ohm
Lead Channel Impedance Value: 570 Ohm
Lead Channel Impedance Value: 779 Ohm
Lead Channel Impedance Value: 798 Ohm
Lead Channel Impedance Value: 855 Ohm
Lead Channel Impedance Value: 893 Ohm
Lead Channel Impedance Value: 912 Ohm
Lead Channel Pacing Threshold Amplitude: 1 V
Lead Channel Pacing Threshold Pulse Width: 0.4 ms
Lead Channel Sensing Intrinsic Amplitude: 1.625 mV
Lead Channel Sensing Intrinsic Amplitude: 1.625 mV
Lead Channel Sensing Intrinsic Amplitude: 3.25 mV
Lead Channel Setting Pacing Amplitude: 1 V
Lead Channel Setting Pacing Amplitude: 2 V
Lead Channel Setting Pacing Amplitude: 2 V
Lead Channel Setting Pacing Pulse Width: 0.4 ms
Lead Channel Setting Pacing Pulse Width: 0.8 ms
Lead Channel Setting Sensing Sensitivity: 0.9 mV

## 2021-12-06 NOTE — Progress Notes (Signed)
Remote pacemaker transmission.   

## 2021-12-08 ENCOUNTER — Other Ambulatory Visit: Payer: Self-pay

## 2021-12-08 MED ORDER — ENTRESTO 24-26 MG PO TABS: 1.0000 | ORAL_TABLET | Freq: Two times a day (BID) | ORAL | 1 refills | Status: DC

## 2022-01-18 NOTE — Progress Notes (Addendum)
Electrophysiology Office Note Date: 01/19/2022  ID:  [name] [name], DOB 29-Jul-1960, MRN [id number]  PCP: Manon Hilding, MD Primary Cardiologist: None Electrophysiologist: Virl Axe, MD   CC: Pacemaker follow-up  [name] [name] is a 61 y.o. female seen today for Virl Axe, MD for routine electrophysiology followup. Since last being seen in our clinic the patient reports doing about the same. She remains SOB with more than ADLs at baseline. She has possibly noticed more fatigue over the past month or so. Today she is in AF, which appears to be a new diagnosis (has previously been referred to but has been predominantly AT).  she denies chest pain, palpitations, PND, orthopnea (2 pillows chronically), nausea, vomiting, dizziness, syncope,  weight gain, or early satiety.   Device History: Medtronic CRTP PPM implanted 05/12/2019 for CHB/AV nodal ablation  Past Medical History:  Diagnosis Date   Anemia    Atrial fibrillation (HCC)    Chronic diastolic heart failure (HCC)    EF 40-45%   Depression    history of depression in the past.   Dysrhythmia    GERD (gastroesophageal reflux disease)    Mitral valvular regurgitation    Pacemaker- medtronic    Initial PM 1995; generator 2009, infection with contralateral implant 2010   S/P AV nodal ablation    Shortness of breath dyspnea    Past Surgical History:  Procedure Laterality Date   ABDOMINAL ADHESION SURGERY  04/2006   BIV UPGRADE N/A 10/13/2020   Procedure: BIV UPGRADE;  Surgeon: Deboraha Sprang, MD;  Location: [place] [facility];  Service: Cardiovascular;  Laterality: N/A;   CARDIAC CATHETERIZATION  09/11/2007   CARDIAC PACEMAKER PLACEMENT  1995   [facility],Medtronic Adapta ADDRL1   CATARACT EXTRACTION, BILATERAL     COLONOSCOPY WITH PROPOFOL N/A 12/28/2020   Procedure: COLONOSCOPY WITH PROPOFOL;  Surgeon: Aviva Signs, MD;  [facility];  Service: Gastroenterology;  Laterality: N/A;    ESOPHAGOGASTRODUODENOSCOPY (EGD) WITH PROPOFOL N/A 12/28/2020   Procedure: ESOPHAGOGASTRODUODENOSCOPY (EGD) WITH PROPOFOL;  Surgeon: Aviva Signs, MD;  [facility]: [facility];  Service: Gastroenterology;  Laterality: N/A;   GASTRIC BYPASS  09/2003   LEFT HEART CATH AND CORONARY ANGIOGRAPHY N/A 07/18/2016   Procedure: Left Heart Cath and Coronary Angiography;  Surgeon: Troy Sine, MD;  Location: [place] [facility];  Service: Cardiovascular;  Laterality: N/A;   TEE WITHOUT CARDIOVERSION N/A 03/19/2014   Procedure: TRANSESOPHAGEAL ECHOCARDIOGRAM (TEE);  Surgeon: Arnoldo Lenis, MD;  Location: [facility];  Service: Endoscopy;  Laterality: N/A;   TEE WITHOUT CARDIOVERSION N/A 05/21/2018   Procedure: TRANSESOPHAGEAL ECHOCARDIOGRAM (TEE);  Surgeon: Arnoldo Lenis, MD;  [facility];  Service: Endoscopy;  Laterality: N/A;   TEE WITHOUT CARDIOVERSION N/A 01/29/2020   Procedure: TRANSESOPHAGEAL ECHOCARDIOGRAM (TEE) WITH PROPOFOL;  Surgeon: Arnoldo Lenis, MD;  [facility];  Service: Endoscopy;  Laterality: N/A;  scheduled by Rosann Auerbach @ Dr. Danie Chandler office    Current Outpatient Medications  Medication Sig Dispense Refill   apixaban (ELIQUIS) 5 MG TABS tablet Take 1 tablet (5 mg total) by mouth 2 (two) times daily. 180 tablet 3   carvedilol (COREG) 3.125 MG tablet Take 6.25 mg by mouth 2 (two) times daily.  6   diclofenac (VOLTAREN) 75 MG EC tablet Take 75 mg by mouth 2 (two) times daily.     DULoxetine (CYMBALTA) 20 MG capsule Take 20 mg by mouth 2 (two) times daily.  empagliflozin (JARDIANCE) 10 MG TABS tablet Take 1 tablet (10 mg total) by mouth daily before breakfast. 30 tablet 11   Melatonin 10 MG TABS Take 20 mg by mouth at bedtime.      meloxicam (MOBIC) 15 MG tablet Take 15 mg by mouth daily.     sacubitril-valsartan (ENTRESTO) 24-26 MG Take 1 tablet by mouth 2 (two) times daily. 180 tablet 1   spironolactone (ALDACTONE) 25 MG tablet Take 1/2 (one-half)  tablet by mouth once daily 45 tablet 3   torsemide (DEMADEX) 20 MG tablet Take 2 tablets (40 mg total) by mouth 2 (two) times daily. 360 tablet 3   traZODone (DESYREL) 100 MG tablet Take 200 mg by mouth at bedtime.      No current facility-administered medications for this visit.    Allergies:   Patient has no known allergies.   Social History: Social History   Socioeconomic History   Marital status: Married    Spouse name: Not on file   Number of children: Not on file   Years of education: Not on file   Highest education level: Not on file  Occupational History   Not on file  Tobacco Use   Smoking status: Never   Smokeless tobacco: Never  Vaping Use   Vaping Use: Never used  Substance and Sexual Activity   Alcohol use: Not Currently    Alcohol/week: 7.0 standard drinks of alcohol    Types: 7 Cans of beer per week   Drug use: No   Sexual activity: Not on file  Other Topics Concern   Not on file  Social History Narrative   Not on file   Social Determinants of Health   Financial Resource Strain: Not on file  Food Insecurity: Not on file  Transportation Needs: Not on file  Physical Activity: Not on file  Stress: Not on file  Social Connections: Not on file  Intimate Partner Violence: Not on file    Family History: Family History  Problem Relation Age of Onset   Diabetes Mother    Hypertension Mother    Stroke Mother    Heart failure Mother    Cancer - Prostate Father    Arthritis-Osteo Sister    Hypertension Sister    Diabetes Sister    CAD Sister    Arthritis Sister    Hypertension Sister    Arthritis-Osteo Sister    Atrial fibrillation Sister    Cancer Sister    Pneumonia Sister    Osteoporosis Sister    Hypertension Sister    Mitral valve prolapse Sister    Arthritis-Osteo Sister    Fibromyalgia Sister    Depression Sister    Hypertension Brother    CAD Brother    Heart attack Brother    Hypertension Brother    CAD Brother    Heart attack  Brother      Review of Systems: All other systems reviewed and are otherwise negative except as noted above.  Physical Exam: Vitals:   01/19/22 1044  BP: 118/68  Pulse: 95  SpO2: 95%  Weight: 280 lb (127 kg)  Height: 5\' 5"  (1.651 m)     GEN- The patient is fatigued appearing, alert and oriented x 3 today.   HEENT: normocephalic, atraumatic; sclera clear, conjunctiva pink; hearing intact; oropharynx clear; neck supple, no JVP Lymph- no cervical lymphadenopathy Lungs- Clear to ausculation bilaterally, normal work of breathing.  No wheezes, rales, rhonchi Heart- Regular rate and rhythm (V paced), no murmurs, rubs  or gallops, PMI not laterally displaced GI- soft, non-tender, non-distended, bowel sounds present, no hepatosplenomegaly Extremities- no clubbing or cyanosis. No peripheral edema; DP/PT/radial pulses 2+ bilaterally MS- no significant deformity or atrophy Skin- warm and dry, no rash or lesion; PPM pocket well healed Psych- euthymic mood, full affect Neuro- strength and sensation are intact  PPM Interrogation-  reviewed in detail today,  See PACEART report.  EKG:  EKG is ordered today. Personal review of ekg ordered today shows AF with controlled VR at ~78 bpm   Recent Labs: 07/25/2021: B Natriuretic Peptide 368.0; Hemoglobin 9.6; Platelets 315 08/08/2021: Magnesium 2.3 09/07/2021: BUN 14; Creatinine, Ser 0.88; Potassium 4.8; Sodium 141   Wt Readings from Last 3 Encounters:  01/19/22 280 lb (127 kg)  10/31/21 293 lb (132.9 kg)  07/29/21 (!) 303 lb (137.4 kg)     Other studies Reviewed: Additional studies/ records that were reviewed today include: Previous EP office notes, Previous remote checks, Most recent labwork.   Assessment and Plan:  1. CHB s/p Medtronic PPM  Normal PPM function See Pace Art report No changes today  2. Chronic systolic CHF  EF normalization by Echo 03/2021  3. H/o syncope No further  4. Mild/mod MR Functional component. Improved  with improvement in EF  5. Atrial tach s/p AV nodal ablation 6. Atrial fibrillation Predominantly her rhythm in the past has been an AT with CL 550-600 She is in persistent AF today, but rates control s/p h/o AV nodal ablation Persistent AF for past 23+ days Start Eliquis 5 mg BID She is holding on to more fluid, but tends to tolerate her AT well.  We will increase diuretics and start OAC as above.  If feels like symptoms are worsening can consider [facility] once adequately anticoagulated, but picture is clouded due to AV nodal ablation.   Current medicines are reviewed at length with the patient today.    Labs/ tests ordered today include:  Orders Placed This Encounter  Procedures   Basic metabolic panel   CBC   CUP PACEART INCLINIC DEVICE CHECK   EKG 12-Lead   Disposition:   Follow up with EP APP in 4 weeks. Will consult with Dr. Graciela Husbands in the interim to help determine best plan in this complicated pt.  Dustin Flock, PA-C  01/19/2022 12:39 PM  [facility] [address] [place] [address] [phone] (office) [phone] (fax)

## 2022-01-19 ENCOUNTER — Encounter: Payer: Self-pay | Admitting: Student

## 2022-01-19 ENCOUNTER — Ambulatory Visit: Payer: 59 | Attending: Student | Admitting: Student

## 2022-01-19 VITALS — BP 118/68 | HR 95 | Ht 65.0 in | Wt 280.0 lb

## 2022-01-19 DIAGNOSIS — I442 Atrioventricular block, complete: Secondary | ICD-10-CM

## 2022-01-19 DIAGNOSIS — Z950 Presence of cardiac pacemaker: Secondary | ICD-10-CM | POA: Diagnosis not present

## 2022-01-19 DIAGNOSIS — I428 Other cardiomyopathies: Secondary | ICD-10-CM | POA: Diagnosis not present

## 2022-01-19 LAB — CUP PACEART INCLINIC DEVICE CHECK
Battery Remaining Longevity: 114 mo
Battery Voltage: 3.01 V
Brady Statistic AP VP Percent: 36.94 %
Brady Statistic AP VS Percent: 0 %
Brady Statistic AS VP Percent: 63.03 %
Brady Statistic AS VS Percent: 0.03 %
Brady Statistic RA Percent Paced: 36.39 %
Brady Statistic RV Percent Paced: 99.97 %
Date Time Interrogation Session: 20231012111925
Implantable Lead Implant Date: 20100201
Implantable Lead Implant Date: 20100201
Implantable Lead Implant Date: 20220706
Implantable Lead Location: 753858
Implantable Lead Location: 753859
Implantable Lead Location: 753860
Implantable Lead Model: 4598
Implantable Lead Model: 5076
Implantable Lead Model: 5076
Implantable Pulse Generator Implant Date: 20220706
Lead Channel Impedance Value: 361 Ohm
Lead Channel Impedance Value: 361 Ohm
Lead Channel Impedance Value: 399 Ohm
Lead Channel Impedance Value: 437 Ohm
Lead Channel Impedance Value: 456 Ohm
Lead Channel Impedance Value: 456 Ohm
Lead Channel Impedance Value: 494 Ohm
Lead Channel Impedance Value: 513 Ohm
Lead Channel Impedance Value: 551 Ohm
Lead Channel Impedance Value: 722 Ohm
Lead Channel Impedance Value: 760 Ohm
Lead Channel Impedance Value: 798 Ohm
Lead Channel Impedance Value: 817 Ohm
Lead Channel Impedance Value: 893 Ohm
Lead Channel Pacing Threshold Amplitude: 1 V
Lead Channel Pacing Threshold Pulse Width: 0.4 ms
Lead Channel Sensing Intrinsic Amplitude: 1.5 mV
Lead Channel Sensing Intrinsic Amplitude: 2.5 mV
Lead Channel Sensing Intrinsic Amplitude: 3.5 mV
Lead Channel Setting Pacing Amplitude: 1.5 V
Lead Channel Setting Pacing Amplitude: 2 V
Lead Channel Setting Pacing Amplitude: 2 V
Lead Channel Setting Pacing Pulse Width: 0.4 ms
Lead Channel Setting Pacing Pulse Width: 0.8 ms
Lead Channel Setting Sensing Sensitivity: 0.9 mV

## 2022-01-19 LAB — CBC
Hematocrit: 33.1 % — ABNORMAL LOW (ref 34.0–46.6)
Hemoglobin: 10.6 g/dL — ABNORMAL LOW (ref 11.1–15.9)
MCH: 23.5 pg — ABNORMAL LOW (ref 26.6–33.0)
MCHC: 32 g/dL (ref 31.5–35.7)
MCV: 73 fL — ABNORMAL LOW (ref 79–97)
Platelets: 327 10*3/uL (ref 150–450)
RBC: 4.52 x10E6/uL (ref 3.77–5.28)
RDW: 19.7 % — ABNORMAL HIGH (ref 11.7–15.4)
WBC: 7.3 10*3/uL (ref 3.4–10.8)

## 2022-01-19 LAB — BASIC METABOLIC PANEL
BUN/Creatinine Ratio: 18 (ref 12–28)
BUN: 17 mg/dL (ref 8–27)
CO2: 30 mmol/L — ABNORMAL HIGH (ref 20–29)
Calcium: 9.2 mg/dL (ref 8.7–10.3)
Chloride: 97 mmol/L (ref 96–106)
Creatinine, Ser: 0.95 mg/dL (ref 0.57–1.00)
Glucose: 101 mg/dL — ABNORMAL HIGH (ref 70–99)
Potassium: 4.6 mmol/L (ref 3.5–5.2)
Sodium: 137 mmol/L (ref 134–144)
eGFR: 69 mL/min/{1.73_m2} (ref >59)

## 2022-01-19 MED ORDER — APIXABAN 5 MG PO TABS: 5.0000 mg | ORAL_TABLET | Freq: Two times a day (BID) | ORAL | 3 refills | Status: DC

## 2022-01-19 NOTE — Patient Instructions (Addendum)
Medication Instructions:  Your physician has recommended you make the following change in your medication:   START: Eliquis 5mg  twice daily Take extra Torsemide for 2 days (60mg  in the AM and 60mg  in the PM)  *If you need a refill on your cardiac medications before your next appointment, please call your pharmacy*   Lab Work: TODAY: BMET, CBC  If you have labs (blood work) drawn today and your tests are completely normal, you will receive your results only by: MyChart Message (if you have MyChart) OR A paper copy in the mail If you have any lab test that is abnormal or we need to change your treatment, we will call you to review the results.   Follow-Up: At [facility], you and your health needs are our priority.  As part of our continuing mission to provide you with exceptional heart care, we have created designated Provider Care Teams.  These Care Teams include your primary Cardiologist (physician) and Advanced Practice Providers (APPs -  Physician Assistants and Nurse Practitioners) who all work together to provide you with the care you need, when you need it.  Your next appointment:   As scheduled  Other Instructions Apixaban Tablets What is this medication? APIXABAN (a PIX a ban) prevents or treats blood clots. It is also used to lower the risk of stroke in people with AFib (atrial fibrillation). It belongs to a group of medications called blood thinners. This medicine may be used for other purposes; ask your health care provider or pharmacist if you have questions. COMMON BRAND NAME(S): Eliquis What should I tell my care team before I take this medication? They need to know if you have any of these conditions: Antiphospholipid antibody syndrome Bleeding disorder History of bleeding in the brain History of blood clots History of stomach bleeding Kidney disease Liver disease Mechanical heart valve Spinal surgery An unusual or allergic reaction to apixaban, other  medications, foods, dyes, or preservatives Pregnant or trying to get pregnant Breast-feeding How should I use this medication? Take this medication by mouth. For your therapy to work as well as possible, take each dose exactly as prescribed on the prescription label. Do not skip doses. Skipping doses or stopping this medication can increase your risk of a blood clot or stroke. Keep taking this medication unless your care team tells you to stop. Take it as directed on the prescription label at the same time every day. You can take it with or without food. If it upsets your stomach, take it with food. A special MedGuide will be given to you by the pharmacist with each prescription and refill. Be sure to read this information carefully each time. Talk to your care team about the use of this medication in children. Special care may be needed. Overdosage: If you think you have taken too much of this medicine contact a poison control center or emergency room at once. NOTE: This medicine is only for you. Do not share this medicine with others. What if I miss a dose? If you miss a dose, take it as soon as you can. If it is almost time for your next dose, take only that dose. Do not take double or extra doses. What may interact with this medication? This medication may interact with the following: Aspirin and aspirin-like medications Certain medications for fungal infections like itraconazole and ketoconazole Certain medications for seizures like carbamazepine and phenytoin Certain medications for blood clots like enoxaparin, dalteparin, heparin, and warfarin Clarithromycin NSAIDs, medications  for pain and inflammation, like ibuprofen or naproxen Rifampin Ritonavir St. John's wort This list may not describe all possible interactions. Give your health care provider a list of all the medicines, herbs, non-prescription drugs, or dietary supplements you use. Also tell them if you smoke, drink alcohol, or  use illegal drugs. Some items may interact with your medicine. What should I watch for while using this medication? Visit your healthcare professional for regular checks on your progress. You may need blood work done while you are taking this medication. Your condition will be monitored carefully while you are receiving this medication. It is important not to miss any appointments. Avoid sports and activities that might cause injury while you are using this medication. Severe falls or injuries can cause unseen bleeding. Be careful when using sharp tools or knives. Consider using an Copy. Take special care brushing or flossing your teeth. Report any injuries, bruising, or red spots on the skin to your healthcare professional. If you are going to need surgery or other procedure, tell your healthcare professional that you are taking this medication. Wear a medical ID bracelet or chain. Carry a card that describes your disease and details of your medication and dosage times. What side effects may I notice from receiving this medication? Side effects that you should report to your care team as soon as possible: Allergic reactions--skin rash, itching, hives, swelling of the face, lips, tongue, or throat Bleeding--bloody or black, tar-like stools, vomiting blood or brown material that looks like coffee grounds, red or dark brown urine, small red or purple spots on the skin, unusual bruising or bleeding Bleeding in the brain--severe headache, stiff neck, confusion, dizziness, change in vision, numbness or weakness of the face, arm, or leg, trouble speaking, trouble walking, vomiting Heavy periods This list may not describe all possible side effects. Call your doctor for medical advice about side effects. You may report side effects to FDA at 1-800-FDA-1088. Where should I keep my medication? Keep out of the reach of children and pets. Store at room temperature between 20 and 25 degrees C (68 and 77  degrees F). Get rid of any unused medication after the expiration date. To get rid of medications that are no longer needed or expired: Take the medication to a medication take-back program. Check with your pharmacy or law enforcement to find a location. If you cannot return the medication, check the label or package insert to see if the medication should be thrown out in the garbage or flushed down the toilet. If you are not sure, ask your care team. If it is safe to put in the trash, empty the medication out of the container. Mix the medication with cat litter, dirt, coffee grounds, or other unwanted substance. Seal the mixture in a bag or container. Put it in the trash. NOTE: This sheet is a summary. It may not cover all possible information. If you have questions about this medicine, talk to your doctor, pharmacist, or health care provider.  2023 Elsevier/Gold Standard (2020-04-23 00:00:00)

## 2022-02-07 ENCOUNTER — Ambulatory Visit (INDEPENDENT_AMBULATORY_CARE_PROVIDER_SITE_OTHER): Payer: 59

## 2022-02-07 DIAGNOSIS — I428 Other cardiomyopathies: Secondary | ICD-10-CM | POA: Diagnosis not present

## 2022-02-09 LAB — CUP PACEART REMOTE DEVICE CHECK
Battery Remaining Longevity: 104 mo
Battery Voltage: 3.01 V
Brady Statistic RA Percent Paced: 0.02 %
Brady Statistic RV Percent Paced: 99.95 %
Date Time Interrogation Session: 20231102042317
Implantable Lead Connection Status: 753985
Implantable Lead Connection Status: 753985
Implantable Lead Connection Status: 753985
Implantable Lead Implant Date: 20100201
Implantable Lead Implant Date: 20100201
Implantable Lead Implant Date: 20220706
Implantable Lead Location: 753858
Implantable Lead Location: 753859
Implantable Lead Location: 753860
Implantable Lead Model: 4598
Implantable Lead Model: 5076
Implantable Lead Model: 5076
Implantable Pulse Generator Implant Date: 20220706
Lead Channel Impedance Value: 323 Ohm
Lead Channel Impedance Value: 342 Ohm
Lead Channel Impedance Value: 342 Ohm
Lead Channel Impedance Value: 437 Ohm
Lead Channel Impedance Value: 456 Ohm
Lead Channel Impedance Value: 456 Ohm
Lead Channel Impedance Value: 456 Ohm
Lead Channel Impedance Value: 475 Ohm
Lead Channel Impedance Value: 494 Ohm
Lead Channel Impedance Value: 741 Ohm
Lead Channel Impedance Value: 741 Ohm
Lead Channel Impedance Value: 798 Ohm
Lead Channel Impedance Value: 798 Ohm
Lead Channel Impedance Value: 817 Ohm
Lead Channel Pacing Threshold Amplitude: 1.125 V
Lead Channel Pacing Threshold Pulse Width: 0.4 ms
Lead Channel Sensing Intrinsic Amplitude: 2.125 mV
Lead Channel Sensing Intrinsic Amplitude: 2.125 mV
Lead Channel Sensing Intrinsic Amplitude: 3.5 mV
Lead Channel Setting Pacing Amplitude: 1.5 V
Lead Channel Setting Pacing Amplitude: 2 V
Lead Channel Setting Pacing Amplitude: 2.25 V
Lead Channel Setting Pacing Pulse Width: 0.4 ms
Lead Channel Setting Pacing Pulse Width: 0.8 ms
Lead Channel Setting Sensing Sensitivity: 0.9 mV
Zone Setting Status: 755011

## 2022-02-15 ENCOUNTER — Encounter: Payer: Self-pay | Admitting: Student

## 2022-02-15 ENCOUNTER — Ambulatory Visit: Payer: 59 | Attending: Student | Admitting: Student

## 2022-02-15 VITALS — BP 118/72 | HR 89 | Ht 65.0 in | Wt 277.0 lb

## 2022-02-15 DIAGNOSIS — I4719 Other supraventricular tachycardia: Secondary | ICD-10-CM

## 2022-02-15 DIAGNOSIS — I480 Paroxysmal atrial fibrillation: Secondary | ICD-10-CM | POA: Diagnosis not present

## 2022-02-15 DIAGNOSIS — I5032 Chronic diastolic (congestive) heart failure: Secondary | ICD-10-CM | POA: Diagnosis not present

## 2022-02-15 DIAGNOSIS — I428 Other cardiomyopathies: Secondary | ICD-10-CM

## 2022-02-15 DIAGNOSIS — I442 Atrioventricular block, complete: Secondary | ICD-10-CM | POA: Diagnosis not present

## 2022-02-15 NOTE — Progress Notes (Signed)
Electrophysiology Office Note Date: 02/15/2022  ID:  [name] [name], DOB [birth year], MRN [id number]  PCP: Estanislado Pandy, MD Primary Cardiologist: None Electrophysiologist: Sherryl Manges, MD   CC: Pacemaker follow-up  [name] [name] is a 61 y.o. female seen today for Sherryl Manges, MD for routine electrophysiology followup.   At last visit, AF was discovered and she was started on eliquis.   Since last being seen in our clinic the patient reports doing about the same, both from our visit, and her general functional status. She has not noticed any difference being in AF. She does have slightly more peripheral edema, but is drinking upwards of 64 oz of water daily.  she denies chest pain, palpitations, dyspnea, PND, orthopnea, nausea, vomiting, dizziness, syncope, weight gain, or early satiety.   Device History: Medtronic CRTP PPM implanted 05/12/2019 for CHB/AV nodal ablation   Past Medical History:  Diagnosis Date   Anemia    Atrial fibrillation (HCC)    Chronic diastolic heart failure (HCC)    EF 40-45%   Depression    history of depression in the past.   Dysrhythmia    GERD (gastroesophageal reflux disease)    Mitral valvular regurgitation    Pacemaker- medtronic    Initial PM 1995; generator 2009, infection with contralateral implant 2010   S/P AV nodal ablation    Shortness of breath dyspnea    Past Surgical History:  Procedure Laterality Date   ABDOMINAL ADHESION SURGERY  04/2006   BIV UPGRADE N/A 10/13/2020   Procedure: BIV UPGRADE;  Surgeon: [name], MD;  [facility];  Service: Cardiovascular;  Laterality: N/A;   CARDIAC CATHETERIZATION  09/11/2007   CARDIAC PACEMAKER PLACEMENT  1995   [facility],Medtronic Adapta ADDRL1   CATARACT EXTRACTION, BILATERAL     COLONOSCOPY WITH PROPOFOL N/A 12/28/2020   Procedure: COLONOSCOPY WITH PROPOFOL;  Surgeon: Franky Macho, MD;  [facility]: [facility];  Service: Gastroenterology;   Laterality: N/A;   ESOPHAGOGASTRODUODENOSCOPY (EGD) WITH PROPOFOL N/A 12/28/2020   Procedure: ESOPHAGOGASTRODUODENOSCOPY (EGD) WITH PROPOFOL;  Surgeon: Franky Macho, MD;  [facility];  Service: Gastroenterology;  Laterality: N/A;   GASTRIC BYPASS  09/2003   LEFT HEART CATH AND CORONARY ANGIOGRAPHY N/A 07/18/2016   Procedure: Left Heart Cath and Coronary Angiography;  Surgeon: Lennette Bihari, MD;  [facility];  Service: Cardiovascular;  Laterality: N/A;   TEE WITHOUT CARDIOVERSION N/A 03/19/2014   Procedure: TRANSESOPHAGEAL ECHOCARDIOGRAM (TEE);  Surgeon: Antoine Poche, MD;  [facility];  Service: Endoscopy;  Laterality: N/A;   TEE WITHOUT CARDIOVERSION N/A 05/21/2018   Procedure: TRANSESOPHAGEAL ECHOCARDIOGRAM (TEE);  Surgeon: Antoine Poche, MD;  [facility];  Service: Endoscopy;  Laterality: N/A;   TEE WITHOUT CARDIOVERSION N/A 01/29/2020   Procedure: TRANSESOPHAGEAL ECHOCARDIOGRAM (TEE) WITH PROPOFOL;  Surgeon: Antoine Poche, MD;  [facility];  Service: Endoscopy;  Laterality: N/A;  scheduled by Mindi Junker @ Dr. Wilmon Arms office    Current Outpatient Medications  Medication Sig Dispense Refill   apixaban (ELIQUIS) 5 MG TABS tablet Take 1 tablet (5 mg total) by mouth 2 (two) times daily. 180 tablet 3   carvedilol (COREG) 3.125 MG tablet Take 6.25 mg by mouth 2 (two) times daily.  6   diclofenac (VOLTAREN) 75 MG EC tablet Take 75 mg by mouth 2 (two) times daily.     DULoxetine (CYMBALTA) 20 MG capsule Take 20 mg by mouth  2 (two) times daily.     empagliflozin (JARDIANCE) 10 MG TABS tablet Take 1 tablet (10 mg total) by mouth daily before breakfast. 30 tablet 11   Melatonin 10 MG TABS Take 20 mg by mouth at bedtime.      meloxicam (MOBIC) 15 MG tablet Take 15 mg by mouth daily.     sacubitril-valsartan (ENTRESTO) 24-26 MG Take 1 tablet by mouth 2 (two) times daily. 180 tablet 1   spironolactone (ALDACTONE) 25 MG tablet Take  1/2 (one-half) tablet by mouth once daily 45 tablet 3   torsemide (DEMADEX) 20 MG tablet Take 2 tablets (40 mg total) by mouth 2 (two) times daily. 360 tablet 3   traZODone (DESYREL) 100 MG tablet Take 200 mg by mouth at bedtime.      No current facility-administered medications for this visit.    Allergies:   Patient has no known allergies.   Social History: Social History   Socioeconomic History   Marital status: Married    Spouse name: Not on file   Number of children: Not on file   Years of education: Not on file   Highest education level: Not on file  Occupational History   Not on file  Tobacco Use   Smoking status: Never   Smokeless tobacco: Never  Vaping Use   Vaping Use: Never used  Substance and Sexual Activity   Alcohol use: Not Currently    Alcohol/week: 7.0 standard drinks of alcohol    Types: 7 Cans of beer per week   Drug use: No   Sexual activity: Not on file  Other Topics Concern   Not on file  Social History Narrative   Not on file   Social Determinants of Health   Financial Resource Strain: Not on file  Food Insecurity: Not on file  Transportation Needs: Not on file  Physical Activity: Not on file  Stress: Not on file  Social Connections: Not on file  Intimate Partner Violence: Not on file    Family History: Family History  Problem Relation Age of Onset   Diabetes Mother    Hypertension Mother    Stroke Mother    Heart failure Mother    Cancer - Prostate Father    Arthritis-Osteo Sister    Hypertension Sister    Diabetes Sister    CAD Sister    Arthritis Sister    Hypertension Sister    Arthritis-Osteo Sister    Atrial fibrillation Sister    Cancer Sister    Pneumonia Sister    Osteoporosis Sister    Hypertension Sister    Mitral valve prolapse Sister    Arthritis-Osteo Sister    Fibromyalgia Sister    Depression Sister    Hypertension Brother    CAD Brother    Heart attack Brother    Hypertension Brother    CAD Brother     Heart attack Brother      Review of Systems: All other systems reviewed and are otherwise negative except as noted above.  Physical Exam: Vitals:   02/15/22 1106  BP: 118/72  Pulse: 89  SpO2: 94%  Weight: 277 lb (125.6 kg)  Height: 5\' 5"  (1.651 m)     GEN- The patient is well appearing, alert and oriented x 3 today.   HEENT: normocephalic, atraumatic; sclera clear, conjunctiva pink; hearing intact; oropharynx clear; neck supple, no JVP Lymph- no cervical lymphadenopathy Lungs- Clear to ausculation bilaterally, normal work of breathing.  No wheezes, rales, rhonchi Heart- Regular  rate and rhythm ( V paced), no murmurs, rubs or gallops, PMI not laterally displaced GI- soft, non-tender, non-distended, bowel sounds present, no hepatosplenomegaly Extremities- no clubbing or cyanosis. Trace peripheral edema; DP/PT/radial pulses 2+ bilaterally MS- no significant deformity or atrophy Skin- warm and dry, no rash or lesion; PPM pocket well healed Psych- euthymic mood, full affect Neuro- strength and sensation are intact  PPM Interrogation-  Brief check today to confirm she remains in AF.   EKG:  EKG is not ordered today.  Recent Labs: 07/25/2021: B Natriuretic Peptide 368.0 08/08/2021: Magnesium 2.3 01/19/2022: BUN 17; Creatinine, Ser 0.95; Hemoglobin 10.6; Platelets 327; Potassium 4.6; Sodium 137   Wt Readings from Last 3 Encounters:  01/19/22 280 lb (127 kg)  10/31/21 293 lb (132.9 kg)  07/29/21 (!) 303 lb (137.4 kg)     Other studies Reviewed: Additional studies/ records that were reviewed today include: Previous EP office notes, Previous remote checks, Most recent labwork.   Assessment and Plan:  1. CHB s/p Medtronic PPM  s/p AV nodal ablation. Normal PPM function by last check. She was checked briefly today to confirm she is still in AF.   2. Chronic systolic CHF EF normalized by Echo 03/2021  3. H/o syncope No further  4. Atrial tach s/p AV nodal ablation 5. Atrial  fibrillation Predominately her rhythm in the was was an AT with CL of 550-600 Continue Eliquis 5 mg BID She has had 100% AF over the last 45 days.  She feels fine, and currently is not interested in a cardioversion.  We discussed that the longer she is in AF, the more difficult it may be to convert to sinus, and although she is asymptomatic, loss of AV synchrony may eventually lead to HF symptoms.  Prior to having AF, her AMS % was ~ 34.1%, primarily AT.   Current medicines are reviewed at length with the patient today.    Labs/ tests ordered today include:  Orders Placed This Encounter  Procedures   Basic metabolic panel   CBC   Disposition:   Follow up with Dr. Graciela Husbands in 2-3 months to discuss his recommendations. She will call sooner if she decides to attempt [facility].   Dustin Flock, PA-C  02/15/2022 8:38 AM  [facility] [address] [place] [address] [phone] (office) [phone] (fax)

## 2022-02-15 NOTE — Patient Instructions (Signed)
Medication Instructions:  Your physician recommends that you continue on your current medications as directed. Please refer to the Current Medication list given to you today.  *If you need a refill on your cardiac medications before your next appointment, please call your pharmacy*   Lab Work: TODAY: BMET, CBC  If you have labs (blood work) drawn today and your tests are completely normal, you will receive your results only by: MyChart Message (if you have MyChart) OR A paper copy in the mail If you have any lab test that is abnormal or we need to change your treatment, we will call you to review the results.   Follow-Up: At [facility], you and your health needs are our priority.  As part of our continuing mission to provide you with exceptional heart care, we have created designated Provider Care Teams.  These Care Teams include your primary Cardiologist (physician) and Advanced Practice Providers (APPs -  Physician Assistants and Nurse Practitioners) who all work together to provide you with the care you need, when you need it.   Your next appointment:   3 month(s)  The format for your next appointment:   In Person  Provider:   Sherryl Manges, MD    Important Information About Sugar

## 2022-02-16 LAB — CBC
Hematocrit: 36.1 % (ref 34.0–46.6)
Hemoglobin: 10.6 g/dL — ABNORMAL LOW (ref 11.1–15.9)
MCH: 22.3 pg — ABNORMAL LOW (ref 26.6–33.0)
MCHC: 29.4 g/dL — ABNORMAL LOW (ref 31.5–35.7)
MCV: 76 fL — ABNORMAL LOW (ref 79–97)
Platelets: 329 10*3/uL (ref 150–450)
RBC: 4.76 x10E6/uL (ref 3.77–5.28)
RDW: 17.3 % — ABNORMAL HIGH (ref 11.7–15.4)
WBC: 8.8 10*3/uL (ref 3.4–10.8)

## 2022-02-16 LAB — BASIC METABOLIC PANEL
BUN/Creatinine Ratio: 22 (ref 12–28)
BUN: 20 mg/dL (ref 8–27)
CO2: 27 mmol/L (ref 20–29)
Calcium: 9.2 mg/dL (ref 8.7–10.3)
Chloride: 98 mmol/L (ref 96–106)
Creatinine, Ser: 0.93 mg/dL (ref 0.57–1.00)
Glucose: 101 mg/dL — ABNORMAL HIGH (ref 70–99)
Potassium: 4.3 mmol/L (ref 3.5–5.2)
Sodium: 137 mmol/L (ref 134–144)
eGFR: 70 mL/min/{1.73_m2} (ref >59)

## 2022-02-22 NOTE — Progress Notes (Signed)
Remote pacemaker transmission.   

## 2022-02-23 ENCOUNTER — Telehealth: Payer: Self-pay | Admitting: Internal Medicine

## 2022-02-23 NOTE — Telephone Encounter (Signed)
Pt stating she left some forms for [employer] for Mindi Junker and wants to know if she got them. She ask that you leave if a message for her mychart if you can

## 2022-02-24 ENCOUNTER — Telehealth: Payer: Self-pay

## 2022-02-24 NOTE — Telephone Encounter (Signed)
Forms have been signed and faxed. Confirmation received.  

## 2022-02-24 NOTE — Telephone Encounter (Signed)
**Note De-Identified Jannelly Bergren Obfuscation** The pts completed [employer] pt asst application was left at he office with documents.  I have completed the providers page of her application and have e-mailed all to the nurse working with Dr Graciela Husbands today so she can obtain his signature, date it, and to fax all to [employer] at the fax number written on the cover letter included

## 2022-02-28 DIAGNOSIS — D649 Anemia, unspecified: Secondary | ICD-10-CM | POA: Diagnosis not present

## 2022-02-28 DIAGNOSIS — D519 Vitamin B12 deficiency anemia, unspecified: Secondary | ICD-10-CM | POA: Diagnosis not present

## 2022-02-28 DIAGNOSIS — I10 Essential (primary) hypertension: Secondary | ICD-10-CM | POA: Diagnosis not present

## 2022-02-28 DIAGNOSIS — E782 Mixed hyperlipidemia: Secondary | ICD-10-CM | POA: Diagnosis not present

## 2022-02-28 DIAGNOSIS — K219 Gastro-esophageal reflux disease without esophagitis: Secondary | ICD-10-CM | POA: Diagnosis not present

## 2022-03-06 ENCOUNTER — Encounter: Payer: Self-pay | Admitting: Internal Medicine

## 2022-03-06 DIAGNOSIS — R69 Illness, unspecified: Secondary | ICD-10-CM | POA: Diagnosis not present

## 2022-03-06 DIAGNOSIS — N1831 Chronic kidney disease, stage 3a: Secondary | ICD-10-CM | POA: Diagnosis not present

## 2022-03-06 DIAGNOSIS — I10 Essential (primary) hypertension: Secondary | ICD-10-CM | POA: Diagnosis not present

## 2022-03-06 DIAGNOSIS — I5020 Unspecified systolic (congestive) heart failure: Secondary | ICD-10-CM | POA: Diagnosis not present

## 2022-03-06 DIAGNOSIS — Z23 Encounter for immunization: Secondary | ICD-10-CM | POA: Diagnosis not present

## 2022-03-06 DIAGNOSIS — K219 Gastro-esophageal reflux disease without esophagitis: Secondary | ICD-10-CM | POA: Diagnosis not present

## 2022-03-06 DIAGNOSIS — D649 Anemia, unspecified: Secondary | ICD-10-CM | POA: Diagnosis not present

## 2022-03-06 DIAGNOSIS — D509 Iron deficiency anemia, unspecified: Secondary | ICD-10-CM | POA: Diagnosis not present

## 2022-03-06 DIAGNOSIS — E7849 Other hyperlipidemia: Secondary | ICD-10-CM | POA: Diagnosis not present

## 2022-03-06 DIAGNOSIS — M179 Osteoarthritis of knee, unspecified: Secondary | ICD-10-CM | POA: Diagnosis not present

## 2022-03-06 NOTE — Telephone Encounter (Signed)
**Note De-Identified Jocee Kissick Obfuscation** Letter received from [employer] stating that they have approved the pt for Entresto assistance until 04/10/2023.  The letter states that they have also notified the pt of this approval as well.  Patient ID: [id number]

## 2022-03-06 NOTE — Telephone Encounter (Signed)
Spoke with pt who reports she needs to confirm with Dr Graciela Husbands she needs to be on Eliquis due to Afib.  Pt states she saw her PCP today who states she will have to stop her Voltaren if she continues on Eliquis.  Pt states she is not convinced she is in Afib.  Pt denies current CP, SOB or dizziness.    Will forward to Dr Graciela Husbands for review and further recommendation.  Pt verbalizes understanding and agrees with current plan.

## 2022-03-07 NOTE — Telephone Encounter (Signed)
Spoke with pt and advised discussed her concerns regarding Eliquis and Voltaren with Dr Graciela Husbands.  Pt states she is willing to discuss cardioversion if Dr Graciela Husbands recommends it going forward.  Pt advised will make Dr Graciela Husbands aware as he continues to contemplate next steps for pt.  Pt thanked Charity fundraiser for the call.

## 2022-03-08 NOTE — Telephone Encounter (Signed)
Spoke with pt and advised Dr Graciela Husbands would like to set up a phone visit to discuss next thoughts.  Pt verbalizes understanding and agrees with current plan.

## 2022-03-09 ENCOUNTER — Ambulatory Visit: Payer: 59 | Admitting: Internal Medicine

## 2022-03-09 ENCOUNTER — Telehealth: Payer: Self-pay

## 2022-03-09 VITALS — BP 120/72 | HR 72 | Ht 65.0 in

## 2022-03-09 NOTE — Telephone Encounter (Signed)
Appt scheduled for 03/09/2022.  Pt aware.

## 2022-03-09 NOTE — Progress Notes (Unsigned)
Electrophysiology TeleHealth Note      Date:  03/09/2022   ID:  [name] [name], [name] [name] 1961/01/03, MRN [id number]  Location: patient's home  Provider location: [address] [place]  Evaluation Performed: Follow-up visit  PCP:  Estanislado Pandy, MD  Cardiologist:    Electrophysiologist:  SK   Chief Complaint:  atrial fib  History of Present Illness:    [name] [name] is a 61 y.o. female who presents via audio/video conferencing for a telehealth visit today.  Since last being seen in our clinic for atrial tachycardia with a secondary tachycardia due to cardiomyopathy status post AV ablation and DDD pacing at [facility] in 1985 with interval generator replacement 2008 and CRT upgrade 6/22 now with interval development of atrial fibrillation for which anticoagulation has been initiated for CHA2DS2-VASc score of greater than or equal to 2 (CHF/gender), the patient reports no change in symptoms and no bleeding on the Eliquis    The patient denies symptoms of fevers, chills, cough, or new SOB worrisome for COVID 19.    Past Medical History:  Diagnosis Date   Anemia    Atrial fibrillation (HCC)    Chronic diastolic heart failure (HCC)    EF 40-45%   Depression    history of depression in the past.   Dysrhythmia    GERD (gastroesophageal reflux disease)    Mitral valvular regurgitation    Pacemaker- medtronic    Initial PM 1995; generator 2009, infection with contralateral implant 2010   S/P AV nodal ablation    Shortness of breath dyspnea     Past Surgical History:  Procedure Laterality Date   ABDOMINAL ADHESION SURGERY  04/2006   BIV UPGRADE N/A 10/13/2020   Procedure: BIV UPGRADE;  Surgeon: [name], MD;  [facility];  Service: Cardiovascular;  Laterality: N/A;   CARDIAC CATHETERIZATION  09/11/2007   CARDIAC PACEMAKER PLACEMENT  1995   [facility],Medtronic Adapta ADDRL1   CATARACT EXTRACTION, BILATERAL     COLONOSCOPY  WITH PROPOFOL N/A 12/28/2020   Procedure: COLONOSCOPY WITH PROPOFOL;  Surgeon: Franky Macho, MD;  [facility];  Service: Gastroenterology;  Laterality: N/A;   ESOPHAGOGASTRODUODENOSCOPY (EGD) WITH PROPOFOL N/A 12/28/2020   Procedure: ESOPHAGOGASTRODUODENOSCOPY (EGD) WITH PROPOFOL;  Surgeon: Franky Macho, MD;  [facility];  Service: Gastroenterology;  Laterality: N/A;   GASTRIC BYPASS  09/2003   LEFT HEART CATH AND CORONARY ANGIOGRAPHY N/A 07/18/2016   Procedure: Left Heart Cath and Coronary Angiography;  Surgeon: Lennette Bihari, MD;  [facility];  Service: Cardiovascular;  Laterality: N/A;   TEE WITHOUT CARDIOVERSION N/A 03/19/2014   Procedure: TRANSESOPHAGEAL ECHOCARDIOGRAM (TEE);  Surgeon: Antoine Poche, MD;  [facility];  Service: Endoscopy;  Laterality: N/A;   TEE WITHOUT CARDIOVERSION N/A 05/21/2018   Procedure: TRANSESOPHAGEAL ECHOCARDIOGRAM (TEE);  Surgeon: Antoine Poche, MD;  [facility];  Service: Endoscopy;  Laterality: N/A;   TEE WITHOUT CARDIOVERSION N/A 01/29/2020   Procedure: TRANSESOPHAGEAL ECHOCARDIOGRAM (TEE) WITH PROPOFOL;  Surgeon: Antoine Poche, MD;  [facility];  Service: Endoscopy;  Laterality: N/A;  scheduled by Mindi Junker @ Dr. Wilmon Arms office    Current Outpatient Medications  Medication Sig Dispense Refill   apixaban (ELIQUIS) 5 MG TABS tablet Take 1 tablet (5 mg total) by mouth 2 (two) times daily. 180 tablet 3   carvedilol (COREG) 3.125 MG tablet Take 6.25 mg by mouth 2 (  two) times daily.  6   diclofenac (VOLTAREN) 75 MG EC tablet Take 75 mg by mouth 2 (two) times daily.     DULoxetine (CYMBALTA) 20 MG capsule Take 20 mg by mouth 2 (two) times daily.     empagliflozin (JARDIANCE) 10 MG TABS tablet Take 1 tablet (10 mg total) by mouth daily before breakfast. 30 tablet 11   Melatonin 10 MG TABS Take 20 mg by mouth at bedtime.      meloxicam (MOBIC) 15 MG tablet Take 15 mg by mouth  daily.     nystatin ointment (MYCOSTATIN) Apply topically.     sacubitril-valsartan (ENTRESTO) 24-26 MG Take 1 tablet by mouth 2 (two) times daily. 180 tablet 1   spironolactone (ALDACTONE) 25 MG tablet Take 1/2 (one-half) tablet by mouth once daily 45 tablet 3   torsemide (DEMADEX) 20 MG tablet Take 2 tablets (40 mg total) by mouth 2 (two) times daily. 360 tablet 3   traZODone (DESYREL) 100 MG tablet Take 200 mg by mouth at bedtime.      No current facility-administered medications for this visit.    Allergies:   Patient has no known allergies.   Social History:  The patient  reports that she has never smoked. She has never used smokeless tobacco. She reports that she does not currently use alcohol after a past usage of about 7.0 standard drinks of alcohol per week. She reports that she does not use drugs.   Family History:  The patient's   family history includes Arthritis in her sister; Arthritis-Osteo in her sister, sister, and sister; Atrial fibrillation in her sister; CAD in her brother, brother, and sister; Cancer in her sister; Cancer - Prostate in her father; Depression in her sister; Diabetes in her mother and sister; Fibromyalgia in her sister; Heart attack in her brother and brother; Heart failure in her mother; Hypertension in her brother, brother, mother, sister, sister, and sister; Mitral valve prolapse in her sister; Osteoporosis in her sister; Pneumonia in her sister; Stroke in her mother.   ROS:  Please see the history of present illness.   All other systems are personally reviewed and negative.    Exam:    Vital Signs:  BP 120/72   Pulse 72   Ht 5\' 5"  (1.651 m)   BMI 46.10 kg/m     Well appearing, alert and conversant, regular work of breathing,  good skin color Eyes- anicteric, neuro- grossly intact, skin- no apparent rash or lesions or cyanosis, mouth- oral mucosa is pink   Labs/Other Tests and Data Reviewed:    Recent Labs: 07/25/2021: B Natriuretic Peptide  368.0 08/08/2021: Magnesium 2.3 02/15/2022: BUN 20; Creatinine, Ser 0.93; Hemoglobin 10.6; Platelets 329; Potassium 4.3; Sodium 137   Wt Readings from Last 3 Encounters:  02/15/22 277 lb (125.6 kg)  01/19/22 280 lb (127 kg)  10/31/21 293 lb (132.9 kg)     Other studies personally reviewed: Additional studies/ records that were reviewed today include: Assessment   ASSESSMENT & PLAN:    Complete heart block status post AV junction ablation     Cardiomyopathy-nonischemic ejection fraction 50-55 >> 35   HFpEF//HFrEF chronic  .   Pacemaker  medtronic        Atrial tachy     Syncope   Mitral regurgitation-mild-moderate   Anemia (GI evaluation ongoing)   The patient has atrial fibrillation.  A lengthy discussion regarding neck steps.  Her CHA2DS2-VASc score is gender +1 and this is a group of patients  and who it is not clear-cut that anticoagulation is necessary.  I estimate that the number needed to treat for her is over 100 given her risk of stroke, her risk of ICH as a consequence of the drug and the benefits of anticoagulation.  Hence, it is a reasonable decision on her part given her desires for Voltaren for her pain to not be consigned to long-term anticoagulation at this point.  Obviously, risks increase with age.  In the short-term I have recommended cardioversion as her atrial tachycardia left atrial appendage emptying velocity was about 80 and while   clearly theoretical having a left atrial appendage emptying velocity of 80 as opposed to what it is in atrial fibrillation might well reduce her risk of left atrial appendage clot somewhat.  As the next step, to be pursued over the next 6 to 12 months would be a conversation with the watchman team as a alternative to anticoagulation given her long-term need for NSAIDs for her pain given her obesity and joint issues which are not going anywhere.  She is agreeable.    Follow-up: 6 months    Current medicines are reviewed at  length with the patient today.   The patient  concerns regarding her medicines.  The following changes were made today:    Labs/ tests ordered today include: DC cardioversion No orders of the defined types were placed in this encounter.     Today, I have spent 28 minutes with the patient with telehealth technology discussing the above.  Signed, Sherryl Manges, MD  03/09/2022 5:47 PM     [facility] [address] [place] [address] [phone] (office) [phone] (fax) evaluation prior  For 6 months

## 2022-03-09 NOTE — Telephone Encounter (Signed)
I spoke with the patient and got the transmission [name] [name] asked for. I told her the nurse will review it and give her a call back.

## 2022-03-09 NOTE — Telephone Encounter (Signed)
Patient is in AF 100% of time with controlled VR's. Also note increased optivol. Normal device function.

## 2022-03-09 NOTE — Telephone Encounter (Signed)
Will have Dr Graciela Husbands review as he has an appointment with pt today 03/09/2022.

## 2022-03-14 ENCOUNTER — Telehealth: Payer: Self-pay

## 2022-03-14 DIAGNOSIS — I480 Paroxysmal atrial fibrillation: Secondary | ICD-10-CM

## 2022-03-14 DIAGNOSIS — Z79899 Other long term (current) drug therapy: Secondary | ICD-10-CM

## 2022-03-14 DIAGNOSIS — Z01812 Encounter for preprocedural laboratory examination: Secondary | ICD-10-CM

## 2022-03-14 NOTE — Telephone Encounter (Signed)
Spoke with pt and reviewed DCCV instructions.  See MyChart message for complete details.  Pt verbalizes understanding and agrees with current plan.

## 2022-03-17 ENCOUNTER — Other Ambulatory Visit: Payer: Self-pay | Admitting: Internal Medicine

## 2022-03-17 DIAGNOSIS — I4819 Other persistent atrial fibrillation: Secondary | ICD-10-CM

## 2022-03-24 ENCOUNTER — Other Ambulatory Visit (HOSPITAL_COMMUNITY): Payer: Self-pay | Admitting: Family Medicine

## 2022-03-24 DIAGNOSIS — Z1231 Encounter for screening mammogram for malignant neoplasm of breast: Secondary | ICD-10-CM

## 2022-03-30 ENCOUNTER — Ambulatory Visit (HOSPITAL_COMMUNITY)
Admission: RE | Admit: 2022-03-30 | Discharge: 2022-03-30 | Disposition: A | Discharge status: Home / Self Care | Payer: 59 | Source: Ambulatory Visit | Attending: Family Medicine | Admitting: Family Medicine

## 2022-03-30 DIAGNOSIS — Z1231 Encounter for screening mammogram for malignant neoplasm of breast: Secondary | ICD-10-CM | POA: Diagnosis not present

## 2022-04-17 ENCOUNTER — Ambulatory Visit (INDEPENDENT_AMBULATORY_CARE_PROVIDER_SITE_OTHER): Payer: 59 | Admitting: Cardiology

## 2022-04-17 ENCOUNTER — Ambulatory Visit: Payer: 59 | Attending: Student

## 2022-04-17 VITALS — BP 124/80 | HR 82 | Resp 20 | Wt 269.0 lb

## 2022-04-17 DIAGNOSIS — Z79899 Other long term (current) drug therapy: Secondary | ICD-10-CM

## 2022-04-17 DIAGNOSIS — I4891 Unspecified atrial fibrillation: Secondary | ICD-10-CM

## 2022-04-17 DIAGNOSIS — Z01812 Encounter for preprocedural laboratory examination: Secondary | ICD-10-CM

## 2022-04-17 DIAGNOSIS — I480 Paroxysmal atrial fibrillation: Secondary | ICD-10-CM

## 2022-04-17 LAB — CBC

## 2022-04-17 NOTE — Patient Instructions (Addendum)
   Nurse Visit   Date of Encounter: 04/17/2022 ID: [name] [name], DOB [birth year], MRN [id number]  PCP:  Manon Hilding, MD   [place] Providers Cardiologist:  None Electrophysiologist:  Virl Axe, MD      Visit Details   VS:  BP 124/80 (BP Location: Right Arm)   Pulse 82   Resp 20   Wt 269 lb 0.4 oz (122 kg)   SpO2 97%   BMI 44.77 kg/m  , BMI Body mass index is 44.77 kg/m.  Wt Readings from Last 3 Encounters:  04/17/22 269 lb 0.4 oz (122 kg)  02/15/22 277 lb (125.6 kg)  01/19/22 280 lb (127 kg)     Reason for visit: RN Visit for EKG / Labs Performed today:  Labs, Vitals, EKG, Provider consulted:DOD, Dr. Shary Key, and Education Changes (medications, testing, etc.) : Pt taking Eliquis  Length of Visit: 30 minutes    Medications Adjustments/Labs and Tests Ordered: Pt was scheduled to have a Cardioversion on Wednesday, January 10 with Dr. Gasper Sells.  Pt [facility] into [facility] on [address] to have and RN visit for an EKG, and labs drawn on 04/17/22 at 1100 am.  Pt weight and vital signs assessed WNL.  EKG performed revealed a ventricular paced rhythm, ( Pt has an MDT PPM ) but was in atrial fibrillation per DOD, Dr. Lemmie Evens. Pemberton's assessment.  ( Ventricular rate 65 bpm, PR unable to assess, QRS duration 148, QT/QTC 498/517.)  Dr. Lemmie Evens Pemberton's orders were to continue plan of care, and have Pt DCCV on 04/19/22 as ordered per Dr. Olin Pia.   Pt made aware of EKG results, and is currently taking Eliquis.  Pt has MyChart, and encouraged to review her labs as they become available.  Pt understood POC, and will report for DCCV on 1/10.        Signed, Varney Daily, RN  04/17/2022 11:37 AM

## 2022-04-18 LAB — CBC
Hematocrit: 36.1 % (ref 34.0–46.6)
Hemoglobin: 10.5 g/dL — ABNORMAL LOW (ref 11.1–15.9)
MCH: 21.4 pg — ABNORMAL LOW (ref 26.6–33.0)
MCHC: 29.1 g/dL — ABNORMAL LOW (ref 31.5–35.7)
MCV: 74 fL — ABNORMAL LOW (ref 79–97)
Platelets: 353 10*3/uL (ref 150–450)
RBC: 4.91 x10E6/uL (ref 3.77–5.28)
RDW: 20.3 % — ABNORMAL HIGH (ref 11.7–15.4)
WBC: 6.7 10*3/uL (ref 3.4–10.8)

## 2022-04-18 LAB — BASIC METABOLIC PANEL
BUN/Creatinine Ratio: 18 (ref 12–28)
BUN: 17 mg/dL (ref 8–27)
CO2: 23 mmol/L (ref 20–29)
Calcium: 9.1 mg/dL (ref 8.7–10.3)
Chloride: 100 mmol/L (ref 96–106)
Creatinine, Ser: 0.96 mg/dL (ref 0.57–1.00)
Glucose: 90 mg/dL (ref 70–99)
Potassium: 5.1 mmol/L (ref 3.5–5.2)
Sodium: 138 mmol/L (ref 134–144)
eGFR: 67 mL/min/{1.73_m2} (ref >59)

## 2022-04-18 NOTE — Anesthesia Preprocedure Evaluation (Signed)
Anesthesia Evaluation  Patient identified by MRN, date of birth, ID band Patient awake    Reviewed: Allergy & Precautions, NPO status , Patient's Chart, lab work & pertinent test results, reviewed documented beta blocker date and time   History of Anesthesia Complications Negative for: history of anesthetic complications  Airway Mallampati: III  TM Distance: >3 FB Neck ROM: Full    Dental  (+) Dental Advisory Given   Pulmonary neg pulmonary ROS   Pulmonary exam normal breath sounds clear to auscultation       Cardiovascular (-) hypertension(-) angina +CHF (diastolic)  (-) Past MI, (-) Cardiac Stents and (-) CABG + dysrhythmias Atrial Fibrillation + pacemaker (Medtronic, complete heart block s/p AV ablation) + Valvular Problems/Murmurs (mild) MR  Rhythm:Irregular Rate:Normal  TTE 04/07/2021: IMPRESSIONS     1. Left ventricular ejection fraction, by estimation, is 50 to 55%. The  left ventricle has low normal function. The left ventricle has no regional  wall motion abnormalities. Left ventricular diastolic parameters were  normal.   2. Right ventricular systolic function is normal. The right ventricular  size is normal. There is mildly elevated pulmonary artery systolic  pressure.   3. The mitral valve is abnormal. Mild mitral valve regurgitation. No  evidence of mitral stenosis.   4. The aortic valve is tricuspid. Aortic valve regurgitation is not  visualized. No aortic stenosis is present.   5. The inferior vena cava is normal in size with greater than 50%  respiratory variability, suggesting right atrial pressure of 3 mmHg.     Neuro/Psych  PSYCHIATRIC DISORDERS  Depression    negative neurological ROS     GI/Hepatic Neg liver ROS,GERD  ,,  Endo/Other  negative endocrine ROS    Renal/GU negative Renal ROS     Musculoskeletal   Abdominal  (+) + obese  Peds  Hematology  (+) Blood dyscrasia, anemia    Anesthesia Other Findings Last Eliquis: 04/19/2022  Reproductive/Obstetrics                             Anesthesia Physical Anesthesia Plan  ASA: 3  Anesthesia Plan: General   Post-op Pain Management:    Induction: Intravenous  PONV Risk Score and Plan: 3 and Treatment may vary due to age or medical condition  Airway Management Planned: Natural Airway and Mask  Additional Equipment:   Intra-op Plan:   Post-operative Plan:   Informed Consent: I have reviewed the patients History and Physical, chart, labs and discussed the procedure including the risks, benefits and alternatives for the proposed anesthesia with the patient or authorized representative who has indicated his/her understanding and acceptance.     Dental advisory given  Plan Discussed with: CRNA and Anesthesiologist  Anesthesia Plan Comments: (Risks of general anesthesia discussed including, but not limited to, sore throat, hoarse voice, chipped/damaged teeth, injury to vocal cords, nausea and vomiting, allergic reactions, lung infection, heart attack, stroke, and death. All questions answered. )        Anesthesia Quick Evaluation

## 2022-04-19 ENCOUNTER — Ambulatory Visit (HOSPITAL_COMMUNITY): Payer: 59 | Admitting: Anesthesiology

## 2022-04-19 ENCOUNTER — Encounter (HOSPITAL_COMMUNITY)
Admission: RE | Disposition: A | Discharge status: Home / Self Care | Payer: Self-pay | Source: Ambulatory Visit | Attending: Internal Medicine

## 2022-04-19 ENCOUNTER — Encounter (HOSPITAL_COMMUNITY): Payer: Self-pay | Admitting: Internal Medicine

## 2022-04-19 ENCOUNTER — Ambulatory Visit (HOSPITAL_COMMUNITY)
Admission: RE | Admit: 2022-04-19 | Discharge: 2022-04-19 | Disposition: A | Discharge status: Home / Self Care | Payer: 59 | Source: Ambulatory Visit | Attending: Internal Medicine | Admitting: Internal Medicine

## 2022-04-19 ENCOUNTER — Other Ambulatory Visit: Payer: Self-pay

## 2022-04-19 ENCOUNTER — Ambulatory Visit (HOSPITAL_BASED_OUTPATIENT_CLINIC_OR_DEPARTMENT_OTHER): Payer: 59 | Admitting: Anesthesiology

## 2022-04-19 DIAGNOSIS — I340 Nonrheumatic mitral (valve) insufficiency: Secondary | ICD-10-CM | POA: Insufficient documentation

## 2022-04-19 DIAGNOSIS — E669 Obesity, unspecified: Secondary | ICD-10-CM | POA: Diagnosis not present

## 2022-04-19 DIAGNOSIS — Z950 Presence of cardiac pacemaker: Secondary | ICD-10-CM | POA: Insufficient documentation

## 2022-04-19 DIAGNOSIS — I4891 Unspecified atrial fibrillation: Secondary | ICD-10-CM | POA: Diagnosis not present

## 2022-04-19 DIAGNOSIS — Z79899 Other long term (current) drug therapy: Secondary | ICD-10-CM | POA: Insufficient documentation

## 2022-04-19 DIAGNOSIS — Z7901 Long term (current) use of anticoagulants: Secondary | ICD-10-CM | POA: Diagnosis not present

## 2022-04-19 DIAGNOSIS — I5030 Unspecified diastolic (congestive) heart failure: Secondary | ICD-10-CM | POA: Diagnosis not present

## 2022-04-19 DIAGNOSIS — I5032 Chronic diastolic (congestive) heart failure: Secondary | ICD-10-CM | POA: Insufficient documentation

## 2022-04-19 DIAGNOSIS — R69 Illness, unspecified: Secondary | ICD-10-CM | POA: Diagnosis not present

## 2022-04-19 DIAGNOSIS — K219 Gastro-esophageal reflux disease without esophagitis: Secondary | ICD-10-CM | POA: Diagnosis not present

## 2022-04-19 DIAGNOSIS — F32A Depression, unspecified: Secondary | ICD-10-CM | POA: Insufficient documentation

## 2022-04-19 DIAGNOSIS — Z7984 Long term (current) use of oral hypoglycemic drugs: Secondary | ICD-10-CM | POA: Diagnosis not present

## 2022-04-19 DIAGNOSIS — D649 Anemia, unspecified: Secondary | ICD-10-CM | POA: Diagnosis not present

## 2022-04-19 DIAGNOSIS — Z8249 Family history of ischemic heart disease and other diseases of the circulatory system: Secondary | ICD-10-CM | POA: Insufficient documentation

## 2022-04-19 DIAGNOSIS — I4819 Other persistent atrial fibrillation: Secondary | ICD-10-CM | POA: Diagnosis not present

## 2022-04-19 DIAGNOSIS — I509 Heart failure, unspecified: Secondary | ICD-10-CM | POA: Diagnosis not present

## 2022-04-19 DIAGNOSIS — Z9884 Bariatric surgery status: Secondary | ICD-10-CM | POA: Insufficient documentation

## 2022-04-19 HISTORY — PX: CARDIOVERSION: SHX1299

## 2022-04-19 SURGERY — CARDIOVERSION
Anesthesia: General

## 2022-04-19 MED ORDER — LIDOCAINE HCL (CARDIAC) PF 100 MG/5ML IV SOSY
PREFILLED_SYRINGE | INTRAVENOUS | Status: DC | PRN
  Administered 2022-04-19: 100 mg via INTRAVENOUS

## 2022-04-19 MED ORDER — SODIUM CHLORIDE 0.9 % IV SOLN: INTRAVENOUS | Status: DC | PRN

## 2022-04-19 MED ORDER — PROPOFOL 10 MG/ML IV BOLUS
INTRAVENOUS | Status: DC | PRN
  Administered 2022-04-19: 80 mg via INTRAVENOUS

## 2022-04-19 NOTE — CV Procedure (Signed)
    Electrical Cardioversion Procedure Note [name] [name] [id number] 1960/05/14  Procedure: Electrical Cardioversion Indications:  Atrial Fibrillation  Time Out: Verified patient identification, verified procedure,medications/allergies/relevent history reviewed, required imaging and test results available.  Performed  Procedure Details  The patient was NPO after midnight. Anesthesia was administered at the beside  by Dr. Zenia Resides  Cardioversion was done with synchronized biphasic defibrillation with AP pads with 200 Joules.  The patient converted to normal sinus rhythm. The patient tolerated the procedure well   IMPRESSION:  Successful cardioversion of atrial fibrillation    [name] [name] [name] [name] 04/19/2022, 9:16 AM

## 2022-04-19 NOTE — Discharge Instructions (Signed)

## 2022-04-19 NOTE — H&P (Signed)
Cardiology Admission History and Physical:   Patient ID: [name] [name]; MRN: [id number]; DOB: [birth year]   Admission date: 04/19/2022  Primary Care Provider: Manon Hilding, MD   Chief Complaint:  DCCV  Patient Profile:   [name] [name] is a 62 y.o. female with a history of Atrial fibrillation Who is presenting for cardioversion  History of Present Illness:   [name] [name] is feeling poorly.    Has had no chest pain, chest pressure, chest tightness, chest stinging .  No shortness of breath, DOE .  No PND or orthopnea.  No weight gain, leg swelling , or abdominal swelling.  No syncope or near syncope . Notes  no palpitations or funny heart beats.   She notes that despite AVJ and prior ablations, she is not have prior DCCV.  Device interrogation; persistent atrial fibrillation.  Past Medical History:  Diagnosis Date   Anemia    Atrial fibrillation (HCC)    Chronic diastolic heart failure (HCC)    EF 40-45%   Depression    history of depression in the past.   Dysrhythmia    GERD (gastroesophageal reflux disease)    Mitral valvular regurgitation    Pacemaker- medtronic    Initial PM 1995; generator 2009, infection with contralateral implant 2010   S/P AV nodal ablation    Shortness of breath dyspnea     Past Surgical History:  Procedure Laterality Date   ABDOMINAL ADHESION SURGERY  04/2006   BIV UPGRADE N/A 10/13/2020   Procedure: BIV UPGRADE;  Surgeon: Deboraha Sprang, MD;  Location: [place] [facility];  Service: Cardiovascular;  Laterality: N/A;   CARDIAC CATHETERIZATION  09/11/2007   CARDIAC PACEMAKER PLACEMENT  1995   [facility],Medtronic Adapta ADDRL1   CATARACT EXTRACTION, BILATERAL     COLONOSCOPY WITH PROPOFOL N/A 12/28/2020   Procedure: COLONOSCOPY WITH PROPOFOL;  Surgeon: Aviva Signs, MD;  [facility];  Service: Gastroenterology;  Laterality: N/A;   ESOPHAGOGASTRODUODENOSCOPY (EGD) WITH PROPOFOL N/A 12/28/2020   Procedure:  ESOPHAGOGASTRODUODENOSCOPY (EGD) WITH PROPOFOL;  Surgeon: Aviva Signs, MD;  [facility];  Service: Gastroenterology;  Laterality: N/A;   GASTRIC BYPASS  09/2003   LEFT HEART CATH AND CORONARY ANGIOGRAPHY N/A 07/18/2016   Procedure: Left Heart Cath and Coronary Angiography;  Surgeon: Troy Sine, MD;  Location: [place] [facility];  Service: Cardiovascular;  Laterality: N/A;   TEE WITHOUT CARDIOVERSION N/A 03/19/2014   Procedure: TRANSESOPHAGEAL ECHOCARDIOGRAM (TEE);  Surgeon: Arnoldo Lenis, MD;  [facility];  Service: Endoscopy;  Laterality: N/A;   TEE WITHOUT CARDIOVERSION N/A 05/21/2018   Procedure: TRANSESOPHAGEAL ECHOCARDIOGRAM (TEE);  Surgeon: Arnoldo Lenis, MD;  [facility];  Service: Endoscopy;  Laterality: N/A;   TEE WITHOUT CARDIOVERSION N/A 01/29/2020   Procedure: TRANSESOPHAGEAL ECHOCARDIOGRAM (TEE) WITH PROPOFOL;  Surgeon: Arnoldo Lenis, MD;  [facility];  Service: Endoscopy;  Laterality: N/A;  scheduled by Rosann Auerbach @ Dr. Danie Chandler office     Medications Prior to Admission: Prior to Admission medications   Medication Sig Start Date End Date Taking? Authorizing Provider  apixaban (ELIQUIS) 5 MG TABS tablet Take 1 tablet (5 mg total) by mouth 2 (two) times daily. 01/19/22  Yes Shirley Friar, PA-C  carvedilol (COREG) 3.125 MG tablet Take 6.25 mg by mouth 2 (two) times daily. 12/21/17  Yes [provider]  diclofenac (VOLTAREN) 75 MG EC tablet Take 75 mg by mouth 2 (two) times  daily. 12/26/21  Yes [provider]  DULoxetine (CYMBALTA) 20 MG capsule Take 20 mg by mouth 2 (two) times daily.   Yes [provider]  empagliflozin (JARDIANCE) 10 MG TABS tablet Take 1 tablet (10 mg total) by mouth daily before breakfast. 02/01/21  Yes Deboraha Sprang, MD  Melatonin 10 MG TABS Take 20 mg by mouth at bedtime.    Yes [provider]  Multiple Vitamins-Minerals (MULTIVITAMIN WITH MINERALS) tablet  Take 1 tablet by mouth daily. With Iron   Yes [provider]  sacubitril-valsartan (ENTRESTO) 24-26 MG Take 1 tablet by mouth 2 (two) times daily. 12/08/21  Yes Deboraha Sprang, MD  spironolactone (ALDACTONE) 25 MG tablet Take 1/2 (one-half) tablet by mouth once daily 11/25/20  Yes Deboraha Sprang, MD  torsemide (DEMADEX) 20 MG tablet Take 2 tablets (40 mg total) by mouth 2 (two) times daily. 07/29/21 07/24/22 Yes BranchAlphonse Guild, MD  traZODone (DESYREL) 100 MG tablet Take 200 mg by mouth at bedtime.    Yes [provider]  nystatin ointment (MYCOSTATIN) Apply 1 Application topically daily as needed (irritation). 03/08/22   [provider]     Allergies:   No Known Allergies  Social History:   Social History   Socioeconomic History   Marital status: Married    Spouse name: Not on file   Number of children: Not on file   Years of education: Not on file   Highest education level: Not on file  Occupational History   Not on file  Tobacco Use   Smoking status: Never   Smokeless tobacco: Never  Vaping Use   Vaping Use: Never used  Substance and Sexual Activity   Alcohol use: Not Currently    Alcohol/week: 7.0 standard drinks of alcohol    Types: 7 Cans of beer per week   Drug use: No   Sexual activity: Not on file  Other Topics Concern   Not on file  Social History Narrative   Not on file   Social Determinants of Health   Financial Resource Strain: Not on file  Food Insecurity: Not on file  Transportation Needs: Not on file  Physical Activity: Not on file  Stress: Not on file  Social Connections: Not on file  Intimate Partner Violence: Not on file    Family History:   The patient's family history includes Arthritis in her sister; Arthritis-Osteo in her sister, sister, and sister; Atrial fibrillation in her sister; CAD in her brother, brother, and sister; Cancer in her sister; Cancer - Prostate in her father; Depression in her sister; Diabetes in  her mother and sister; Fibromyalgia in her sister; Heart attack in her brother and brother; Heart failure in her mother; Hypertension in her brother, brother, mother, sister, sister, and sister; Mitral valve prolapse in her sister; Osteoporosis in her sister; Pneumonia in her sister; Stroke in her mother.    ROS:  Please see the history of present illness.  All other ROS reviewed and negative.     Physical Exam/Data:   Vitals:   04/19/22 0826  BP: 128/78  Pulse: 74  Resp: 19  Temp: (!) 97.4 F (36.3 C)  TempSrc: Temporal  SpO2: 99%   No intake or output data in the 24 hours ending 04/19/22 0858 There were no vitals filed for this visit. There is no height or weight on file to calculate BMI.   Gen: no distress,\ Neck: No JVD Cardiac: No Rubs or Gallops, no Murmur, RRR +  2 radial pulses Respiratory: Clear to auscultation bilaterally, normal effort, normal  respiratory rate GI: Soft, nontender, non-distended  MS: No  edema;  moves all extremities Integument: Skin feels warm Neuro:  At time of evaluation, alert and oriented to person/place/time/situation  Psych: Normal affect, patient feels well  Laboratory Data:  Chemistry Recent Labs  Lab 04/17/22 1050  NA 138  K 5.1  CL 100  CO2 23  GLUCOSE 90  BUN 17  CREATININE 0.96  CALCIUM 9.1    No results for input(s): "PROT", "ALBUMIN", "AST", "ALT", "ALKPHOS", "BILITOT" in the last 168 hours. Hematology Recent Labs  Lab 04/17/22 1050  WBC 6.7  RBC 4.91  HGB 10.5*  HCT 36.1  MCV 74*  MCH 21.4*  MCHC 29.1*  RDW 20.3*  PLT 353   Cardiac EnzymesNo results for input(s): "TROPONINI" in the last 168 hours. No results for input(s): "TROPIPOC" in the last 168 hours.  BNPNo results for input(s): "BNP", "PROBNP" in the last 168 hours.  DDimer No results for input(s): "DDIMER" in the last 168 hours.  Radiology/Studies:  No results found.  Assessment and Plan:   Atrial Fibrillation  Risks and benefits of cardioversion  have been discussed with the patient.  These include bleeding, infection, kidney damage, stroke, heart attack, death.  The patient understands these risks and is willing to proceed.     For questions or updates, please contact [facility] Please consult [email or web address] for contact info under Cardiology/STEMI.   Riley Lam, MD FASE [facility] Cardiologist [facility]  [address] [place], [address] [place], [place] [address] [phone]  8:58 AM

## 2022-04-19 NOTE — Transfer of Care (Signed)
Immediate Anesthesia Transfer of Care Note  Patient: [name] [name]  Procedure(s) Performed: CARDIOVERSION  Patient Location: PACU and Endoscopy Unit  Anesthesia Type:General  Level of Consciousness: drowsy  Airway & Oxygen Therapy: Patient Spontanous Breathing and Patient connected to nasal cannula oxygen  Post-op Assessment: Report given to RN and Post -op Vital signs reviewed and stable  Post vital signs: Reviewed and stable  Last Vitals:  Vitals Value Taken Time  BP    Temp    Pulse    Resp    SpO2      Last Pain:  Vitals:   04/19/22 0826  TempSrc: Temporal  PainSc: 0-No pain      Patients Stated Pain Goal: 3 (01/15/11 1975)  Complications: No notable events documented.

## 2022-04-19 NOTE — Anesthesia Postprocedure Evaluation (Signed)
Anesthesia Post Note  Patient: [name] [name]  Procedure(s) Performed: CARDIOVERSION     Patient location during evaluation: PACU Anesthesia Type: General Level of consciousness: awake Pain management: pain level controlled Vital Signs Assessment: post-procedure vital signs reviewed and stable Respiratory status: spontaneous breathing, nonlabored ventilation and respiratory function stable Cardiovascular status: blood pressure returned to baseline and stable Postop Assessment: no apparent nausea or vomiting Anesthetic complications: no   No notable events documented.  Last Vitals:  Vitals:   04/19/22 0922 04/19/22 0927  BP: 101/69 111/71  Pulse: 60 61  Resp: 13 16  Temp:    SpO2: 100% 97%    Last Pain:  Vitals:   04/19/22 0927  TempSrc:   PainSc: 0-No pain                 Nilda Simmer

## 2022-04-22 ENCOUNTER — Encounter (HOSPITAL_COMMUNITY): Payer: Self-pay | Admitting: Internal Medicine

## 2022-04-25 DIAGNOSIS — Z01419 Encounter for gynecological examination (general) (routine) without abnormal findings: Secondary | ICD-10-CM | POA: Diagnosis not present

## 2022-04-25 DIAGNOSIS — Z7689 Persons encountering health services in other specified circumstances: Secondary | ICD-10-CM | POA: Diagnosis not present

## 2022-04-25 DIAGNOSIS — Z124 Encounter for screening for malignant neoplasm of cervix: Secondary | ICD-10-CM | POA: Diagnosis not present

## 2022-04-25 DIAGNOSIS — R030 Elevated blood-pressure reading, without diagnosis of hypertension: Secondary | ICD-10-CM | POA: Diagnosis not present

## 2022-05-09 ENCOUNTER — Ambulatory Visit: Payer: 59 | Admitting: Cardiology

## 2022-05-09 ENCOUNTER — Ambulatory Visit: Payer: 59

## 2022-05-09 DIAGNOSIS — I428 Other cardiomyopathies: Secondary | ICD-10-CM | POA: Diagnosis not present

## 2022-05-09 LAB — CUP PACEART REMOTE DEVICE CHECK
Battery Remaining Longevity: 103 mo
Battery Voltage: 3 V
Brady Statistic AP VP Percent: 30.87 %
Brady Statistic AP VS Percent: 0 %
Brady Statistic AS VP Percent: 69.11 %
Brady Statistic AS VS Percent: 0.02 %
Brady Statistic RA Percent Paced: 33.85 %
Brady Statistic RV Percent Paced: 99.98 %
Date Time Interrogation Session: 20240129203656
Implantable Lead Connection Status: 753985
Implantable Lead Connection Status: 753985
Implantable Lead Connection Status: 753985
Implantable Lead Implant Date: 20100201
Implantable Lead Implant Date: 20100201
Implantable Lead Implant Date: 20220706
Implantable Lead Location: 753858
Implantable Lead Location: 753859
Implantable Lead Location: 753860
Implantable Lead Model: 4598
Implantable Lead Model: 5076
Implantable Lead Model: 5076
Implantable Pulse Generator Implant Date: 20220706
Lead Channel Impedance Value: 342 Ohm
Lead Channel Impedance Value: 361 Ohm
Lead Channel Impedance Value: 380 Ohm
Lead Channel Impedance Value: 437 Ohm
Lead Channel Impedance Value: 437 Ohm
Lead Channel Impedance Value: 456 Ohm
Lead Channel Impedance Value: 475 Ohm
Lead Channel Impedance Value: 513 Ohm
Lead Channel Impedance Value: 513 Ohm
Lead Channel Impedance Value: 741 Ohm
Lead Channel Impedance Value: 760 Ohm
Lead Channel Impedance Value: 798 Ohm
Lead Channel Impedance Value: 798 Ohm
Lead Channel Impedance Value: 836 Ohm
Lead Channel Pacing Threshold Amplitude: 1.125 V
Lead Channel Pacing Threshold Pulse Width: 0.4 ms
Lead Channel Sensing Intrinsic Amplitude: 1.375 mV
Lead Channel Sensing Intrinsic Amplitude: 1.375 mV
Lead Channel Sensing Intrinsic Amplitude: 3.5 mV
Lead Channel Setting Pacing Amplitude: 1.5 V
Lead Channel Setting Pacing Amplitude: 2 V
Lead Channel Setting Pacing Amplitude: 2.25 V
Lead Channel Setting Pacing Pulse Width: 0.4 ms
Lead Channel Setting Pacing Pulse Width: 0.8 ms
Lead Channel Setting Sensing Sensitivity: 0.9 mV
Zone Setting Status: 755011

## 2022-05-14 NOTE — Progress Notes (Signed)
Pre procedure ECG performed.

## 2022-05-15 ENCOUNTER — Encounter: Payer: Self-pay | Admitting: Family Medicine

## 2022-05-15 ENCOUNTER — Ambulatory Visit (INDEPENDENT_AMBULATORY_CARE_PROVIDER_SITE_OTHER): Payer: 59 | Admitting: Family Medicine

## 2022-05-15 DIAGNOSIS — G8929 Other chronic pain: Secondary | ICD-10-CM | POA: Diagnosis not present

## 2022-05-15 DIAGNOSIS — Z8679 Personal history of other diseases of the circulatory system: Secondary | ICD-10-CM | POA: Insufficient documentation

## 2022-05-15 DIAGNOSIS — M25562 Pain in left knee: Secondary | ICD-10-CM | POA: Diagnosis not present

## 2022-05-15 DIAGNOSIS — I4891 Unspecified atrial fibrillation: Secondary | ICD-10-CM

## 2022-05-15 DIAGNOSIS — I5042 Chronic combined systolic (congestive) and diastolic (congestive) heart failure: Secondary | ICD-10-CM | POA: Insufficient documentation

## 2022-05-15 DIAGNOSIS — M25552 Pain in left hip: Secondary | ICD-10-CM | POA: Diagnosis not present

## 2022-05-15 DIAGNOSIS — D509 Iron deficiency anemia, unspecified: Secondary | ICD-10-CM | POA: Diagnosis not present

## 2022-05-15 DIAGNOSIS — M25561 Pain in right knee: Secondary | ICD-10-CM

## 2022-05-15 DIAGNOSIS — Z13220 Encounter for screening for lipoid disorders: Secondary | ICD-10-CM

## 2022-05-15 DIAGNOSIS — M25551 Pain in right hip: Secondary | ICD-10-CM | POA: Diagnosis not present

## 2022-05-15 MED ORDER — TRAMADOL HCL 50 MG PO TABS: 50.0000 mg | ORAL_TABLET | Freq: Three times a day (TID) | ORAL | 0 refills | Status: AC | PRN

## 2022-05-15 NOTE — Patient Instructions (Addendum)
Labs at your convenience.  Medication as directed.  Xrays at the hospital.  Follow up in 3-4 months.

## 2022-05-16 ENCOUNTER — Other Ambulatory Visit: Payer: Self-pay | Admitting: Family Medicine

## 2022-05-16 DIAGNOSIS — G8929 Other chronic pain: Secondary | ICD-10-CM | POA: Insufficient documentation

## 2022-05-16 LAB — CMP14+EGFR
ALT: 19 IU/L (ref 0–32)
AST: 28 IU/L (ref 0–40)
Albumin/Globulin Ratio: 1.7 (ref 1.2–2.2)
Albumin: 4.4 g/dL (ref 3.9–4.9)
Alkaline Phosphatase: 134 IU/L — ABNORMAL HIGH (ref 44–121)
BUN/Creatinine Ratio: 16 (ref 12–28)
BUN: 18 mg/dL (ref 8–27)
Bilirubin Total: 0.2 mg/dL (ref 0.0–1.2)
CO2: 25 mmol/L (ref 20–29)
Calcium: 9 mg/dL (ref 8.7–10.3)
Chloride: 95 mmol/L — ABNORMAL LOW (ref 96–106)
Creatinine, Ser: 1.11 mg/dL — ABNORMAL HIGH (ref 0.57–1.00)
Globulin, Total: 2.6 g/dL (ref 1.5–4.5)
Glucose: 104 mg/dL — ABNORMAL HIGH (ref 70–99)
Potassium: 4.6 mmol/L (ref 3.5–5.2)
Sodium: 136 mmol/L (ref 134–144)
Total Protein: 7 g/dL (ref 6.0–8.5)
eGFR: 57 mL/min/{1.73_m2} — ABNORMAL LOW (ref >59)

## 2022-05-16 LAB — CBC
Hematocrit: 34.5 % (ref 34.0–46.6)
Hemoglobin: 10.3 g/dL — ABNORMAL LOW (ref 11.1–15.9)
MCH: 22 pg — ABNORMAL LOW (ref 26.6–33.0)
MCHC: 29.9 g/dL — ABNORMAL LOW (ref 31.5–35.7)
MCV: 74 fL — ABNORMAL LOW (ref 79–97)
Platelets: 312 10*3/uL (ref 150–450)
RBC: 4.68 x10E6/uL (ref 3.77–5.28)
RDW: 20.2 % — ABNORMAL HIGH (ref 11.7–15.4)
WBC: 6.5 10*3/uL (ref 3.4–10.8)

## 2022-05-16 LAB — HEMOGLOBIN A1C
Est. average glucose Bld gHb Est-mCnc: 120 mg/dL
Hgb A1c MFr Bld: 5.8 % — ABNORMAL HIGH (ref 4.8–5.6)

## 2022-05-16 LAB — LIPID PANEL
Chol/HDL Ratio: 2.9 ratio (ref 0.0–4.4)
Cholesterol, Total: 204 mg/dL — ABNORMAL HIGH (ref 100–199)
HDL: 71 mg/dL (ref >39)
LDL Chol Calc (NIH): 114 mg/dL — ABNORMAL HIGH (ref 0–99)
Triglycerides: 106 mg/dL (ref 0–149)
VLDL Cholesterol Cal: 19 mg/dL (ref 5–40)

## 2022-05-16 LAB — IRON,TIBC AND FERRITIN PANEL
Ferritin: 14 ng/mL — ABNORMAL LOW (ref 15–150)
Iron Saturation: 5 % — CL (ref 15–55)
Iron: 25 ug/dL — ABNORMAL LOW (ref 27–139)
Total Iron Binding Capacity: 456 ug/dL — ABNORMAL HIGH (ref 250–450)
UIBC: 431 ug/dL — ABNORMAL HIGH (ref 118–369)

## 2022-05-16 MED ORDER — ATORVASTATIN CALCIUM 40 MG PO TABS: 40.0000 mg | ORAL_TABLET | Freq: Every day | ORAL | 3 refills | Status: DC

## 2022-05-16 NOTE — Progress Notes (Signed)
Subjective:  Patient ID: [name] [name], female    DOB: [birth year]  Age: 62 y.o. MRN: [id number]  CC: Chief Complaint  Patient presents with   Establish Care    Joint pain    HPI:  62 year old female with a history of complete heart block (pacemaker in place), CHF followed by cardiology, history of atrial tachycardia, atrial fibrillation, history of bariatric surgery, iron deficiency anemia, depression, insomnia presents to establish care.  Patient has had prior gastric bypass.  Has known anemia.  Patient states that she received IV iron years ago but is not currently on any treatment other than a multivitamin with iron.  Needs labs today.  Patient is followed closely by cardiology.  She has recently had cardioversion and is now in sinus rhythm.  She continues on Eliquis, carvedilol.  She is no longer taking Jardiance she states that it was too costly.  Remains on Entresto and spironolactone as well.  She takes torsemide daily but has decreased the dose recently.  Insomnia stable on trazodone.  Depression is stable on Cymbalta.  Patient states that she suffers from significant arthritis of the hips and knees.  She has previously been on diclofenac.  She states that her former PCP did not want to continue this medication due to underlying medical issues.  Patient states that she needs something to help her arthritis so that she is able to function.  She is currently on diclofenac once daily.  Will discuss today.  Patient Active Problem List   Diagnosis Date Noted   Morbid obesity ([place]) 05/16/2022   Chronic pain of both knees 05/16/2022   Chronic pain of both hips 05/16/2022   History of complete AV block 05/15/2022   Atrial fibrillation ([place]) 05/15/2022   Chronic combined systolic and diastolic CHF (congestive heart failure) ([place]) 05/15/2022   Iron deficiency anemia    Atrial tachycardia 04/26/2019   NICM (nonischemic cardiomyopathy) ([place]) 04/26/2019   PACEMAKER,MDT 03/02/2009     Social Hx   Social History   Socioeconomic History   Marital status: Married    Spouse name: Not on file   Number of children: Not on file   Years of education: Not on file   Highest education level: Not on file  Occupational History   Not on file  Tobacco Use   Smoking status: Never   Smokeless tobacco: Never  Vaping Use   Vaping Use: Never used  Substance and Sexual Activity   Alcohol use: Not Currently    Alcohol/week: 7.0 standard drinks of alcohol    Types: 7 Cans of beer per week   Drug use: No   Sexual activity: Not on file  Other Topics Concern   Not on file  Social History Narrative   Not on file   Social Determinants of Health   Financial Resource Strain: Not on file  Food Insecurity: Not on file  Transportation Needs: Not on file  Physical Activity: Not on file  Stress: Not on file  Social Connections: Not on file    Review of Systems  Constitutional: Negative.   Musculoskeletal:  Positive for arthralgias.   Objective:  BP 108/67   Pulse 73   Temp (!) 97.3 F (36.3 C)   Ht 5\' 5"  (1.651 m)   Wt 276 lb (125.2 kg)   SpO2 96%   BMI 45.93 kg/m      05/15/2022    2:24 PM 04/19/2022    9:32 AM 04/19/2022    9:27 AM  BP/Weight  Systolic BP 258 527 782  Diastolic BP 67 71 71  Wt. (Lbs) 276    BMI 45.93 kg/m2      Physical Exam Vitals and nursing note reviewed.  Constitutional:      Appearance: Normal appearance. She is obese.  HENT:     Head: Normocephalic and atraumatic.  Eyes:     General:        Right eye: No discharge.        Left eye: No discharge.     Conjunctiva/sclera: Conjunctivae normal.  Cardiovascular:     Rate and Rhythm: Normal rate and regular rhythm.  Pulmonary:     Effort: Pulmonary effort is normal.     Breath sounds: Normal breath sounds. No wheezing, rhonchi or rales.  Neurological:     Mental Status: She is alert.  Psychiatric:        Mood and Affect: Mood normal.        Behavior: Behavior normal.     Lab  Results  Component Value Date   WBC 6.5 05/15/2022   HGB 10.3 (L) 05/15/2022   HCT 34.5 05/15/2022   PLT 312 05/15/2022   GLUCOSE 104 (H) 05/15/2022   CHOL 204 (H) 05/15/2022   TRIG 106 05/15/2022   HDL 71 05/15/2022   LDLCALC 114 (H) 05/15/2022   ALT 19 05/15/2022   AST 28 05/15/2022   NA 136 05/15/2022   K 4.6 05/15/2022   CL 95 (L) 05/15/2022   CREATININE 1.11 (H) 05/15/2022   BUN 18 05/15/2022   CO2 25 05/15/2022   INR 1.1 RATIO (H) 05/05/2008   HGBA1C 5.8 (H) 05/15/2022     Assessment & Plan:   Problem List Items Addressed This Visit       Cardiovascular and Mediastinum   Atrial fibrillation ([place])    Now in sinus rhythm after cardioversion.  Continue current medications.      Chronic combined systolic and diastolic CHF (congestive heart failure) (HCC)    CHF stable.  Continue current medications.      Relevant Orders   CMP14+EGFR (Completed)     Other   Chronic pain of both hips    X-rays for further evaluation.  Advised against NSAIDs.  NSAIDs not recommended in the setting of bariatric surgery as well as underlying kidney disease as her creatinine has returned elevated.  Additionally, patient on anticoagulation which makes this not a good idea as well.  Starting on tramadol.  Awaiting x-ray results.      Relevant Medications   traMADol (ULTRAM) 50 MG tablet   Other Relevant Orders   DG Hip Unilat W OR W/O Pelvis 2-3 Views Left   DG Hip Unilat W OR W/O Pelvis 2-3 Views Right   Chronic pain of both knees    X-rays for further evaluation.  Advised against NSAIDs.  NSAIDs not recommended in the setting of bariatric surgery as well as underlying kidney disease as her creatinine has returned elevated.  Additionally, patient on anticoagulation which makes this not a good idea as well.  Starting on tramadol.  Awaiting x-ray results.      Relevant Medications   traMADol (ULTRAM) 50 MG tablet   Other Relevant Orders   DG Knee Complete 4 Views Right   DG Knee  Complete 4 Views Left   Iron deficiency anemia    Labs confirm iron deficiency anemia.  Patient cannot absorb iron appropriately due to gastric bypass surgery.  Referring to hematology/oncology.  Needs IV iron.  Relevant Orders   CBC (Completed)   Iron, TIBC and Ferritin Panel (Completed)   Ambulatory referral to Hematology / Oncology   Morbid obesity ([place])   Relevant Orders   Hemoglobin A1c (Completed)   Other Visit Diagnoses     Screening, lipid       Relevant Orders   Lipid panel (Completed)       Meds ordered this encounter  Medications   traMADol (ULTRAM) 50 MG tablet    Sig: Take 1 tablet (50 mg total) by mouth every 8 (eight) hours as needed for up to 5 days.    Dispense:  15 tablet    Refill:  0    Follow-up:  3-4 months  [place] DO [place]

## 2022-05-16 NOTE — Assessment & Plan Note (Signed)
Labs confirm iron deficiency anemia.  Patient cannot absorb iron appropriately due to gastric bypass surgery.  Referring to hematology/oncology.  Needs IV iron.

## 2022-05-16 NOTE — Assessment & Plan Note (Signed)
Now in sinus rhythm after cardioversion.  Continue current medications.

## 2022-05-16 NOTE — Assessment & Plan Note (Signed)
X-rays for further evaluation.  Advised against NSAIDs.  NSAIDs not recommended in the setting of bariatric surgery as well as underlying kidney disease as her creatinine has returned elevated.  Additionally, patient on anticoagulation which makes this not a good idea as well.  Starting on tramadol.  Awaiting x-ray results.

## 2022-05-16 NOTE — Assessment & Plan Note (Signed)
CHF stable.  Continue current medications.

## 2022-05-16 NOTE — Assessment & Plan Note (Signed)
X-rays for further evaluation.  Advised against NSAIDs.  NSAIDs not recommended in the setting of bariatric surgery as well as underlying kidney disease as her creatinine has returned elevated.  Additionally, patient on anticoagulation which makes this not a good idea as well.  Starting on tramadol.  Awaiting x-ray results. 

## 2022-05-17 ENCOUNTER — Ambulatory Visit: Payer: 59 | Attending: Internal Medicine | Admitting: Internal Medicine

## 2022-05-17 ENCOUNTER — Encounter: Payer: Self-pay | Admitting: Internal Medicine

## 2022-05-17 VITALS — BP 120/80 | HR 74 | Ht 65.0 in | Wt 273.6 lb

## 2022-05-17 DIAGNOSIS — I5042 Chronic combined systolic (congestive) and diastolic (congestive) heart failure: Secondary | ICD-10-CM

## 2022-05-17 DIAGNOSIS — Z8679 Personal history of other diseases of the circulatory system: Secondary | ICD-10-CM

## 2022-05-17 DIAGNOSIS — I480 Paroxysmal atrial fibrillation: Secondary | ICD-10-CM | POA: Diagnosis not present

## 2022-05-17 DIAGNOSIS — Z950 Presence of cardiac pacemaker: Secondary | ICD-10-CM

## 2022-05-17 DIAGNOSIS — Z79899 Other long term (current) drug therapy: Secondary | ICD-10-CM | POA: Diagnosis not present

## 2022-05-17 DIAGNOSIS — I4719 Other supraventricular tachycardia: Secondary | ICD-10-CM | POA: Diagnosis not present

## 2022-05-17 DIAGNOSIS — I428 Other cardiomyopathies: Secondary | ICD-10-CM

## 2022-05-17 MED ORDER — EMPAGLIFLOZIN 10 MG PO TABS: 10.0000 mg | ORAL_TABLET | Freq: Every day | ORAL | 3 refills | Status: DC

## 2022-05-17 MED ORDER — TORSEMIDE 20 MG PO TABS: 60.0000 mg | ORAL_TABLET | Freq: Every day | ORAL | 3 refills | Status: DC

## 2022-05-17 NOTE — Patient Instructions (Signed)
Medication Instructions:  Your physician has recommended you make the following change in your medication:   Increase Torsemide 20mg  to 60mg  - 3 tablets by mouth daily.  *If you need a refill on your cardiac medications before your next appointment, please call your pharmacy*   Lab Work: BMET in 2 weeks If you have labs (blood work) drawn today and your tests are completely normal, you will receive your results only by: [place] (if you have MyChart) OR A paper copy in the mail If you have any lab test that is abnormal or we need to change your treatment, we will call you to review the results.   Testing/Procedures: None ordered.    Follow-Up: At [facility], you and your health needs are our priority.  As part of our continuing mission to provide you with exceptional heart care, we have created designated Provider Care Teams.  These Care Teams include your primary Cardiologist (physician) and Advanced Practice Providers (APPs -  Physician Assistants and Nurse Practitioners) who all work together to provide you with the care you need, when you need it.  We recommend signing up for the patient portal called "MyChart".  Sign up information is provided on this After Visit Summary.  MyChart is used to connect with patients for Virtual Visits (Telemedicine).  Patients are able to view lab/test results, encounter notes, upcoming appointments, etc.  Non-urgent messages can be sent to your provider as well.   To learn more about what you can do with MyChart, go to [email or web address].    Your next appointment:   12 months with Dr Caryl Comes

## 2022-05-17 NOTE — Progress Notes (Signed)
Patient ID: [name] [name], female   DOB: 03/26/1961, 62 y.o.   MRN: [id number]       Patient Care Team: Coral Spikes, DO as PCP - General (Family Medicine) Deboraha Sprang, MD as PCP - Electrophysiology (Cardiology) Deboraha Sprang, MD (Cardiology) Thompson Grayer, MD (Inactive) (Cardiology)   HPI  [name] [name] is a 62 y.o. 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 She underwent generator replacement in April of 2008.  CRT upgrade 6/22 at generator replacement with significant Improvement but residual dyspnea  Device History: MDT dual chamber PPM implanted 1995 for complete heart block; gen change 2009; extraction 2010 2/2 infection with reimplantation on right side CRT upgrade 6/22  Persistent tachycardia prompted recommendation for DC cardioversion.  In the context of long-term use of Voltaren for pain, long-term anticoagulation is not desired, or CHADS-VASc or currently is gender +1  Noted no improvement following cardioversion.  Creatinine had increased to 1.7 so show on her own decreased her torsemide from twice daily--daily.  Some increasing shortness of breath  .   DATE TEST EF    10/15 Echo   55 % MR mod-severe  12/15 TEE  55 % MR 2 jets, mild-mod  4/18 LHC 35-45% CA normal  1/20 Echo 40-45% MR md  2/20 TEE 45-50 MVP mild/MR mod  10/20 Echo (CE) 40% MR mild-mod  10/21 TEE  45-50% MR  mod LA emptying vel 60 cm/sec  12/22 Echo  50-55% MR mild    DATE CR K HGB Plt  11/18 0.63      10/20 0.65(2/20) 0.57 (2/20) 4.4  14.2 76<<195  05/22 0.87 5.1    9/22 0.81 4.3 10.9 (6/22) 241  2/24 1.11 4.6 10.3 (Fe def)     GI eval, colonoscopy and endoscopy neg for her iron deficiency anemia  Past Medical History:  Diagnosis Date   Anemia    Atrial fibrillation (HCC)    Chronic diastolic heart failure (HCC)    EF 40-45%   Depression    history of depression in the past.   Dysrhythmia     GERD (gastroesophageal reflux disease)    Mitral valvular regurgitation    Pacemaker- medtronic    Initial PM 1995; generator 2009, infection with contralateral implant 2010   S/P AV nodal ablation    Shortness of breath dyspnea     Past Surgical History:  Procedure Laterality Date   ABDOMINAL ADHESION SURGERY  04/2006   BIV UPGRADE N/A 10/13/2020   Procedure: BIV UPGRADE;  Surgeon: Deboraha Sprang, MD;  Location: [facility];  Service: Cardiovascular;  Laterality: N/A;   CARDIAC CATHETERIZATION  09/11/2007   CARDIAC PACEMAKER [facility],Medtronic Adapta ADDRL1   CARDIOVERSION N/A 04/19/2022   Procedure: CARDIOVERSION;  Surgeon: Werner Lean, MD;  Location: [facility];  Service: Cardiovascular;  Laterality: N/A;   CATARACT EXTRACTION, BILATERAL     COLONOSCOPY WITH PROPOFOL N/A 12/28/2020   Procedure: COLONOSCOPY WITH PROPOFOL;  Surgeon: Aviva Signs, MD;  [facility];  Service: Gastroenterology;  Laterality: N/A;   ESOPHAGOGASTRODUODENOSCOPY (EGD) WITH PROPOFOL N/A 12/28/2020   Procedure: ESOPHAGOGASTRODUODENOSCOPY (EGD) WITH PROPOFOL;  Surgeon: Aviva Signs, MD;  [facility];  Service: Gastroenterology;  Laterality: N/A;   GASTRIC BYPASS  09/2003   LEFT HEART CATH AND CORONARY ANGIOGRAPHY N/A 07/18/2016   Procedure: Left Heart Cath and Coronary Angiography;  Surgeon: Troy Sine, MD;  Location: [facility];  Service: Cardiovascular;  Laterality: N/A;   TEE WITHOUT CARDIOVERSION N/A 03/19/2014   Procedure: TRANSESOPHAGEAL ECHOCARDIOGRAM (TEE);  Surgeon: Arnoldo Lenis, MD;  [facility];  Service: Endoscopy;  Laterality: N/A;   TEE WITHOUT CARDIOVERSION N/A 05/21/2018   Procedure: TRANSESOPHAGEAL ECHOCARDIOGRAM (TEE);  Surgeon: Arnoldo Lenis, MD;  [facility];  Service: Endoscopy;  Laterality: N/A;   TEE WITHOUT CARDIOVERSION N/A 01/29/2020   Procedure: TRANSESOPHAGEAL  ECHOCARDIOGRAM (TEE) WITH PROPOFOL;  Surgeon: Arnoldo Lenis, MD;  [facility];  Service: Endoscopy;  Laterality: N/A;  scheduled by Rosann Auerbach @ Dr. Danie Chandler office    Current Outpatient Medications  Medication Sig Dispense Refill   apixaban (ELIQUIS) 5 MG TABS tablet Take 1 tablet (5 mg total) by mouth 2 (two) times daily. 180 tablet 3   atorvastatin (LIPITOR) 40 MG tablet Take 1 tablet (40 mg total) by mouth daily. 90 tablet 3   carvedilol (COREG) 3.125 MG tablet Take 6.25 mg by mouth 2 (two) times daily.  6   DULoxetine (CYMBALTA) 20 MG capsule Take 20 mg by mouth 2 (two) times daily.     empagliflozin (JARDIANCE) 10 MG TABS tablet Take by mouth daily.     Melatonin 10 MG TABS Take 20 mg by mouth at bedtime.      Multiple Vitamins-Minerals (MULTIVITAMIN WITH MINERALS) tablet Take 1 tablet by mouth daily. With Iron     sacubitril-valsartan (ENTRESTO) 24-26 MG Take 1 tablet by mouth 2 (two) times daily. 180 tablet 1   spironolactone (ALDACTONE) 25 MG tablet Take 1/2 (one-half) tablet by mouth once daily 45 tablet 3   torsemide (DEMADEX) 20 MG tablet Take 2 tablets (40 mg total) by mouth 2 (two) times daily. 360 tablet 3   traMADol (ULTRAM) 50 MG tablet Take 1 tablet (50 mg total) by mouth every 8 (eight) hours as needed for up to 5 days. 15 tablet 0   traZODone (DESYREL) 100 MG tablet Take 200 mg by mouth at bedtime.      No current facility-administered medications for this visit.    No Known Allergies  Review of Systems negative except from HPI and PMH  Physical Exam BP 120/80   Pulse 74   Ht 5\' 5"  (1.651 m)   Wt 273 lb 9.6 oz (124.1 kg)   SpO2 98%   BMI 45.53 kg/m  Well developed and Morbidly obese  in no acute distress HENT normal Neck supple with JVP-flat Clear Device pocket well healed; without hematoma or erythema.  There is no tethering  Regular rate and rhythm, no  gallop No murmur Abd-soft with active BS No Clubbing cyanosis  edema Skin-warm and dry A  & Oriented  Grossly normal sensory and motor function   ECG atrial tachycardia 2 regular both in PP interval as well as axis.  Cycle length about 400 to 600 ms  Device function is normal. Programming changes   See Paceart for details    OptiVol is strikingly increased over the last 2 weeks  ECG preprocedure QRSd 185 ECG postprocedure QRSd 126 ECG 9/22 QRSd about 135 ms  Assessment and  Plan  Complete heart block status post AV junction ablation    Cardiomyopathy-nonischemic ejection fraction 50-55 >> 35  HFpEF//HFrEF chronic   Renal dysfunction grade 3>>2 Estimated Creatinine Clearance: 70.4 mL/min (A) (by C-G formula based on SCr of 1.11 mg/dL (H)). .   Pacemaker- CRT  medtronic       Atrial tachy/atrial fibrillation  Syncope  Mitral regurgitation-mild-moderate  Anemia (GI evaluation ongoing)-iron deficiency    Remains short of breath.  No significant improvement in symptoms following the cardioversion which restored her atrial tachycardia from her atrial fibrillation.  Hence, we will not pursue its elimination again.  Her CHA2DS2-VASc score is 1+ gender for decreased LV function.  Hence her risks of stroke are moderate.  Given her use of Voltaren for pain, at this juncture we have elected to discontinue the Eliquis because of the increased risks of bleeding associated concomitant use.  In the event that her risk profile changes, would recommend watchman.  Her OptiVol is significantly changed, she had down titrated her torsemide from twice daily--Daily when she found her creatinine had increased to 1.7.  Has noted some worsening of shortness of breath.  Will increase her torsemide from 40 daily--60 daily.  We will reassess her renal function in about 2 weeks

## 2022-05-19 ENCOUNTER — Encounter: Payer: Self-pay | Admitting: Hematology

## 2022-05-19 ENCOUNTER — Ambulatory Visit (HOSPITAL_COMMUNITY)
Admission: RE | Admit: 2022-05-19 | Discharge: 2022-05-19 | Disposition: A | Discharge status: Home / Self Care | Payer: 59 | Source: Ambulatory Visit | Attending: Family Medicine | Admitting: Family Medicine

## 2022-05-19 ENCOUNTER — Inpatient Hospital Stay: Payer: 59 | Attending: Hematology | Admitting: Hematology

## 2022-05-19 VITALS — BP 105/75 | HR 80 | Temp 98.1°F | Resp 16 | Ht 65.0 in | Wt 264.1 lb

## 2022-05-19 DIAGNOSIS — M25562 Pain in left knee: Secondary | ICD-10-CM | POA: Insufficient documentation

## 2022-05-19 DIAGNOSIS — M25552 Pain in left hip: Secondary | ICD-10-CM | POA: Insufficient documentation

## 2022-05-19 DIAGNOSIS — M25561 Pain in right knee: Secondary | ICD-10-CM | POA: Insufficient documentation

## 2022-05-19 DIAGNOSIS — G8929 Other chronic pain: Secondary | ICD-10-CM | POA: Insufficient documentation

## 2022-05-19 DIAGNOSIS — M25551 Pain in right hip: Secondary | ICD-10-CM | POA: Insufficient documentation

## 2022-05-19 DIAGNOSIS — Z79899 Other long term (current) drug therapy: Secondary | ICD-10-CM | POA: Insufficient documentation

## 2022-05-19 DIAGNOSIS — D509 Iron deficiency anemia, unspecified: Secondary | ICD-10-CM | POA: Diagnosis not present

## 2022-05-19 DIAGNOSIS — M1612 Unilateral primary osteoarthritis, left hip: Secondary | ICD-10-CM | POA: Diagnosis not present

## 2022-05-19 DIAGNOSIS — D508 Other iron deficiency anemias: Secondary | ICD-10-CM

## 2022-05-19 DIAGNOSIS — Z9884 Bariatric surgery status: Secondary | ICD-10-CM | POA: Insufficient documentation

## 2022-05-19 NOTE — Patient Instructions (Signed)
[facility]  Discharge Instructions  You were seen and examined today by Dr. Delton Coombes. Dr. Delton Coombes is a hematologist, meaning that he specializes in blood abnormalities. Dr. Delton Coombes discussed your past medical history, family history of cancers/blood conditions and the events that led to you being here today.  You were referred to Dr. Delton Coombes due to iron deficiency anemia. Dr. Delton Coombes has recommended iron infusions. These will be scheduled here in the [place].  Follow-up as scheduled.  Thank you for choosing [facility] to provide your oncology and hematology care.   To afford each patient quality time with our provider, please arrive at least 15 minutes before your scheduled appointment time. You may need to reschedule your appointment if you arrive late (10 or more minutes). Arriving late affects you and other patients whose appointments are after yours.  Also, if you miss three or more appointments without notifying the office, you may be dismissed from the clinic at the provider's discretion.    Again, thank you for choosing [facility].  Our hope is that these requests will decrease the amount of time that you wait before being seen by our physicians.   If you have a lab appointment with the [facility] please come in thru the Main Entrance and check in at the main information desk.           _____________________________________________________________  Should you have questions after your visit to [facility], please contact our office at [phone] and follow the prompts.  Our office hours are 8:00 a.m. to 4:30 p.m. Monday - Thursday and 8:00 a.m. to 2:30 p.m. Friday.  Please note that voicemails left after 4:00 p.m. may not be returned until the following business day.  We are closed weekends and all major holidays.  You do have access to a nurse 24-7, just call the main number to  the clinic [phone] and do not press any options, hold on the line and a nurse will answer the phone.    For prescription refill requests, have your pharmacy contact our office and allow 72 hours.    Masks are optional in the cancer centers. If you would like for your care team to wear a mask while they are taking care of you, please let them know. You may have one support person who is at least 62 years old accompany you for your appointments.

## 2022-05-19 NOTE — Progress Notes (Signed)
[facility] [address], [place] [address]   Clinic Day:  05/19/2022  Referring physician: Coral Spikes, DO  Patient Care Team: Coral Spikes, DO as PCP - General (Family Medicine) Deboraha Sprang, MD as PCP - Electrophysiology (Cardiology) Deboraha Sprang, MD (Cardiology) Thompson Grayer, MD (Inactive) (Cardiology)   ASSESSMENT & PLAN:   Assessment:  1.  Severe iron deficiency anemia: - 05/15/2022: Ferritin 14, percent saturation 5, Hb-10.3, MCV-74 - She takes multivitamin with iron for the past 2 months.  Cannot take regular oral iron due to severe constipation. - She has been fatigued for a long time. - History of gastric bypass in 2005, lost 170 pounds, gained back 150 pounds. - Previous history of iron infusion x 210 years ago at [facility]. - No prior transfusion history.  Positive for ice pica.  2.  Social/family history: - Independent of ADLs and IADLs.  She is a retired Therapist, sports, worked at [employer]. - Non-smoker. - No family history of anemia. - Sister died of breast cancer.  Another sister had glioblastoma.  Brother had multiple myeloma.  Another sister had breast cancer.  Plan:  1.  Severe iron deficiency anemia: -We have discussed her labs from 05/15/2022 in detail. - She did not have any improvement in hemoglobin since she was started on MVT with iron.  It is likely that she is not absorbing iron because of history of gastric bypass. - Recommend Venofer 500 mg x 2 infusions. - RTC 2 months for follow-up with repeat CBC, ferritin, iron panel.  Will also check for other nutritional deficiencies including 123456, folic acid, MMA, copper, vitamin D levels.  No orders of the defined types were placed in this encounter.     I,Alexis Herring,acting as a Education administrator for [employer], MD.,have documented all relevant documentation on the behalf of Derek Jack, MD,as directed by  Derek Jack, MD while in the presence of Derek Jack, MD.   I, Derek Jack MD, have reviewed the above documentation for accuracy and completeness, and I agree with the above.   Alexis Herring   2/9/20247:49 AM  CHIEF COMPLAINT/PURPOSE OF CONSULT:   Diagnosis: iron deficiency anemia  Current Therapy: Multivitamin with iron  HISTORY OF PRESENT ILLNESS:   Hena is a 62 y.o. female presenting to clinic today for evaluation of iron deficiency anemia at the request of her PCP Dr. Thersa Salt. She has a Hx of gastric bypass in 2005. She was previously receiving IV iron many years ago at [facility] in [place] following bypass surgery.  Today, she states that she is doing okay overall. Her appetite level is at 100%. Her energy level is at 50%. She reports chronic fatigue which she attributes to her CHF and cardiomyopathy. She is limited in her activities due to her heart history. She reports occasional leg swelling  She has been taking a multivitamin with iron for the last x2 months. She has tried PO iron in the past but did not tolerate it due to constipation. Patient has never had a blood transfusion. She never had any negative side effects with IV iron in the past. She reports ice cravings and denies any other unusual cravings. She denies any black or bloody stools. She does not take a Vitamin B12 or Vitamin D. She previously received B12 injections but has not had them in a long time.   She denies any family history of anemia. She had a sister who passed away from breast  cancer and another who sister who passed away from glioblastoma. She has a living sister with breast cancer and a living brother with multiple myeloma. She denies any other family history of cancer.  She states that she lost 170lbs after her gastric bypass in 2005. However, she gained back 150lbs in the last few years. She has never been a smoker.  She denies any history of fatty liver. She reports an elevated ALP recently but there is no prior history of this.  Her PCP plans to recheck her LFTs.  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  PAST MEDICAL HISTORY:   Past Medical History: Past Medical History:  Diagnosis Date   Anemia    Atrial fibrillation (HCC)    Chronic diastolic heart failure (HCC)    EF 40-45%   Depression    history of depression in the past.   Dysrhythmia    GERD (gastroesophageal reflux disease)    Mitral valvular regurgitation    Pacemaker- medtronic    Initial PM 1995; generator 2009, infection with contralateral implant 2010   S/P AV nodal ablation    Shortness of breath dyspnea     Surgical History: Past Surgical History:  Procedure Laterality Date   ABDOMINAL ADHESION SURGERY  04/2006   BIV UPGRADE N/A 10/13/2020   Procedure: BIV UPGRADE;  Surgeon: Deboraha Sprang, MD;  Location: [facility];  Service: Cardiovascular;  Laterality: N/A;   CARDIAC CATHETERIZATION  09/11/2007   CARDIAC PACEMAKER [facility],Medtronic Adapta ADDRL1   CARDIOVERSION N/A 04/19/2022   Procedure: CARDIOVERSION;  Surgeon: Werner Lean, MD;  Location: [facility];  Service: Cardiovascular;  Laterality: N/A;   CATARACT EXTRACTION, BILATERAL     COLONOSCOPY WITH PROPOFOL N/A 12/28/2020   Procedure: COLONOSCOPY WITH PROPOFOL;  Surgeon: Aviva Signs, MD;  [facility];  Service: Gastroenterology;  Laterality: N/A;   ESOPHAGOGASTRODUODENOSCOPY (EGD) WITH PROPOFOL N/A 12/28/2020   Procedure: ESOPHAGOGASTRODUODENOSCOPY (EGD) WITH PROPOFOL;  Surgeon: Aviva Signs, MD;  [facility];  Service: Gastroenterology;  Laterality: N/A;   GASTRIC BYPASS  09/2003   LEFT HEART CATH AND CORONARY ANGIOGRAPHY N/A 07/18/2016   Procedure: Left Heart Cath and Coronary Angiography;  Surgeon: Troy Sine, MD;  Location: [facility];  Service: Cardiovascular;  Laterality: N/A;   TEE WITHOUT CARDIOVERSION N/A 03/19/2014    Procedure: TRANSESOPHAGEAL ECHOCARDIOGRAM (TEE);  Surgeon: Arnoldo Lenis, MD;  [facility];  Service: Endoscopy;  Laterality: N/A;   TEE WITHOUT CARDIOVERSION N/A 05/21/2018   Procedure: TRANSESOPHAGEAL ECHOCARDIOGRAM (TEE);  Surgeon: Arnoldo Lenis, MD;  [facility];  Service: Endoscopy;  Laterality: N/A;   TEE WITHOUT CARDIOVERSION N/A 01/29/2020   Procedure: TRANSESOPHAGEAL ECHOCARDIOGRAM (TEE) WITH PROPOFOL;  Surgeon: Arnoldo Lenis, MD;  [facility];  Service: Endoscopy;  Laterality: N/A;  scheduled by Rosann Auerbach @ Dr. Danie Chandler office    Social History: Social History   Socioeconomic History   Marital status: Married    Spouse name: Not on file   Number of children: Not on file   Years of education: Not on file   Highest education level: Not on file  Occupational History   Not on file  Tobacco Use   Smoking status: Never   Smokeless tobacco: Never  Vaping Use   Vaping Use: Never used  Substance and Sexual Activity  Alcohol use: Not Currently    Alcohol/week: 7.0 standard drinks of alcohol    Types: 7 Cans of beer per week   Drug use: No   Sexual activity: Not on file  Other Topics Concern   Not on file  Social History Narrative   Not on file   Social Determinants of Health   Financial Resource Strain: Not on file  Food Insecurity: Not on file  Transportation Needs: Not on file  Physical Activity: Not on file  Stress: Not on file  Social Connections: Not on file  Intimate Partner Violence: Not on file    Family History: Family History  Problem Relation Age of Onset   Diabetes Mother    Hypertension Mother    Stroke Mother    Heart failure Mother    Cancer - Prostate Father    Arthritis-Osteo Sister    Hypertension Sister    Diabetes Sister    CAD Sister    Arthritis Sister    Hypertension Sister    Arthritis-Osteo Sister    Atrial fibrillation Sister    Cancer Sister    Pneumonia Sister    Osteoporosis  Sister    Hypertension Sister    Mitral valve prolapse Sister    Arthritis-Osteo Sister    Fibromyalgia Sister    Depression Sister    Hypertension Brother    CAD Brother    Heart attack Brother    Hypertension Brother    CAD Brother    Heart attack Brother     Current Medications:  Current Outpatient Medications:    atorvastatin (LIPITOR) 40 MG tablet, Take 1 tablet (40 mg total) by mouth daily., Disp: 90 tablet, Rfl: 3   carvedilol (COREG) 3.125 MG tablet, Take 6.25 mg by mouth 2 (two) times daily., Disp: , Rfl: 6   DULoxetine (CYMBALTA) 20 MG capsule, Take 20 mg by mouth 2 (two) times daily., Disp: , Rfl:    empagliflozin (JARDIANCE) 10 MG TABS tablet, Take 1 tablet (10 mg total) by mouth daily., Disp: 90 tablet, Rfl: 3   Melatonin 10 MG TABS, Take 20 mg by mouth at bedtime. , Disp: , Rfl:    Multiple Vitamins-Minerals (MULTIVITAMIN WITH MINERALS) tablet, Take 1 tablet by mouth daily. With Iron, Disp: , Rfl:    sacubitril-valsartan (ENTRESTO) 24-26 MG, Take 1 tablet by mouth 2 (two) times daily., Disp: 180 tablet, Rfl: 1   spironolactone (ALDACTONE) 25 MG tablet, Take 1/2 (one-half) tablet by mouth once daily, Disp: 45 tablet, Rfl: 3   torsemide (DEMADEX) 20 MG tablet, Take 3 tablets (60 mg total) by mouth daily., Disp: 270 tablet, Rfl: 3   traMADol (ULTRAM) 50 MG tablet, Take 1 tablet (50 mg total) by mouth every 8 (eight) hours as needed for up to 5 days., Disp: 15 tablet, Rfl: 0   traZODone (DESYREL) 100 MG tablet, Take 200 mg by mouth at bedtime. , Disp: , Rfl:    Allergies: No Known Allergies  REVIEW OF SYSTEMS:   Review of Systems  Constitutional:  Positive for fatigue. Negative for chills and fever.  HENT:   Negative for lump/mass, mouth sores, nosebleeds, sore throat and trouble swallowing.   Eyes:  Negative for eye problems.  Respiratory:  Positive for shortness of breath. Negative for cough.   Cardiovascular:  Negative for chest pain, leg swelling and palpitations.   Gastrointestinal:  Negative for abdominal pain, blood in stool, constipation, diarrhea, nausea and vomiting.  Genitourinary:  Negative for bladder incontinence, difficulty urinating, dysuria,  frequency, hematuria and nocturia.   Musculoskeletal:  Positive for arthralgias (hips, knees) and back pain. Negative for flank pain, myalgias and neck pain.  Skin:  Negative for itching and rash.  Neurological:  Negative for dizziness, headaches and numbness.  Hematological:  Does not bruise/bleed easily.  Psychiatric/Behavioral:  Positive for sleep disturbance. Negative for depression and suicidal ideas. The patient is not nervous/anxious.   All other systems reviewed and are negative.    VITALS:   There were no vitals taken for this visit.  Wt Readings from Last 3 Encounters:  05/17/22 124.1 kg (273 lb 9.6 oz)  05/15/22 125.2 kg (276 lb)  04/17/22 122 kg (269 lb 0.4 oz)    There is no height or weight on file to calculate BMI.   PHYSICAL EXAM:   Physical Exam Vitals and nursing note reviewed. Exam conducted with a chaperone present.  Constitutional:      Appearance: Normal appearance.  Cardiovascular:     Rate and Rhythm: Normal rate and regular rhythm.     Pulses: Normal pulses.     Heart sounds: Normal heart sounds.  Pulmonary:     Effort: Pulmonary effort is normal.     Breath sounds: Normal breath sounds.  Abdominal:     Palpations: Abdomen is soft. There is no hepatomegaly, splenomegaly or mass.     Tenderness: There is no abdominal tenderness.  Musculoskeletal:     Right lower leg: No edema.     Left lower leg: No edema.  Lymphadenopathy:     Cervical: No cervical adenopathy.     Right cervical: No superficial, deep or posterior cervical adenopathy.    Left cervical: No superficial, deep or posterior cervical adenopathy.     Upper Body:     Right upper body: No supraclavicular or axillary adenopathy.     Left upper body: No supraclavicular or axillary adenopathy.   Neurological:     General: No focal deficit present.     Mental Status: She is alert and oriented to person, place, and time.  Psychiatric:        Mood and Affect: Mood normal.        Behavior: Behavior normal.     LABS:      Latest Ref Rng & Units 05/15/2022    3:34 PM 04/17/2022   10:50 AM 02/15/2022   11:41 AM  CBC  WBC 3.4 - 10.8 x10E3/uL 6.5  6.7  8.8   Hemoglobin 11.1 - 15.9 g/dL 10.3  10.5  10.6   Hematocrit 34.0 - 46.6 % 34.5  36.1  36.1   Platelets 150 - 450 x10E3/uL 312  353  329       Latest Ref Rng & Units 05/15/2022    3:34 PM 04/17/2022   10:50 AM 02/15/2022   11:41 AM  CMP  Glucose 70 - 99 mg/dL 104  90  101   BUN 8 - 27 mg/dL 18  17  20   $ Creatinine 0.57 - 1.00 mg/dL 1.11  0.96  0.93   Sodium 134 - 144 mmol/L 136  138  137   Potassium 3.5 - 5.2 mmol/L 4.6  5.1  4.3   Chloride 96 - 106 mmol/L 95  100  98   CO2 20 - 29 mmol/L 25  23  27   $ Calcium 8.7 - 10.3 mg/dL 9.0  9.1  9.2   Total Protein 6.0 - 8.5 g/dL 7.0     Total Bilirubin 0.0 - 1.2 mg/dL 0.2  Alkaline Phos 44 - 121 IU/L 134     AST 0 - 40 IU/L 28     ALT 0 - 32 IU/L 19        No results found for: "CEA1", "CEA" / No results found for: "CEA1", "CEA" No results found for: "PSA1" No results found for: "[id number]" No results found for: "CAN125"  No results found for: "TOTALPROTELP", "ALBUMINELP", "A1GS", "A2GS", "BETS", "BETA2SER", "GAMS", "MSPIKE", "SPEI" Lab Results  Component Value Date   TIBC 456 (H) 05/15/2022   FERRITIN 14 (L) 05/15/2022   FERRITIN 12 (L) 02/01/2021   IRONPCTSAT 5 (LL) 05/15/2022   No results found for: "LDH"   STUDIES:   CUP PACEART REMOTE DEVICE CHECK  Result Date: 05/09/2022 Scheduled remote reviewed. Normal device function.  AF burden 16% (since last remote 05/06/22), presenting rhythm ? AT (varied rates) see episode log/events with BiV pacing. Programmed DDIR. Patient s/p DCCV 04/19/22, no episodes of AFib since cardioversion. Peaked Optivol fluid index since October,  thoracic impedance below daily reference (trending upward, but still below daily reference). [place]- Eliquis Next remote 91 days- JJB

## 2022-05-23 ENCOUNTER — Inpatient Hospital Stay: Payer: 59

## 2022-05-23 VITALS — BP 95/78 | HR 63 | Temp 97.6°F | Resp 18

## 2022-05-23 DIAGNOSIS — Z79899 Other long term (current) drug therapy: Secondary | ICD-10-CM | POA: Diagnosis not present

## 2022-05-23 DIAGNOSIS — D508 Other iron deficiency anemias: Secondary | ICD-10-CM

## 2022-05-23 DIAGNOSIS — D509 Iron deficiency anemia, unspecified: Secondary | ICD-10-CM | POA: Diagnosis not present

## 2022-05-23 DIAGNOSIS — Z9884 Bariatric surgery status: Secondary | ICD-10-CM | POA: Diagnosis not present

## 2022-05-23 MED ORDER — CETIRIZINE HCL 10 MG PO TABS
10.0000 mg | ORAL_TABLET | Freq: Once | ORAL | Status: AC
  Administered 2022-05-23: 10 mg via ORAL
  Filled 2022-05-23: qty 1

## 2022-05-23 MED ORDER — SODIUM CHLORIDE 0.9 % IV SOLN: Freq: Once | INTRAVENOUS | Status: AC

## 2022-05-23 MED ORDER — SODIUM CHLORIDE 0.9 % IV SOLN
500.0000 mg | Freq: Once | INTRAVENOUS | Status: AC
  Administered 2022-05-23: 500 mg via INTRAVENOUS
  Filled 2022-05-23: qty 20

## 2022-05-23 NOTE — Patient Instructions (Signed)
[facility]  Discharge Instructions: Thank you for choosing [facility] to provide your oncology and hematology care.  If you have a lab appointment with the [facility], please come in thru the Main Entrance and check in at the main information desk.  Wear comfortable clothing and clothing appropriate for easy access to any Portacath or PICC line.   We strive to give you quality time with your provider. You may need to reschedule your appointment if you arrive late (15 or more minutes).  Arriving late affects you and other patients whose appointments are after yours.  Also, if you miss three or more appointments without notifying the office, you may be dismissed from the clinic at the provider's discretion.      For prescription refill requests, have your pharmacy contact our office and allow 72 hours for refills to be completed.    Iron Sucrose Injection What is this medication? IRON SUCROSE (EYE ern SOO krose) treats low levels of iron (iron deficiency anemia) in people with kidney disease. Iron is a mineral that plays an important role in making red blood cells, which carry oxygen from your lungs to the rest of your body. This medicine may be used for other purposes; ask your health care provider or pharmacist if you have questions. COMMON BRAND NAME(S): Venofer What should I tell my care team before I take this medication? They need to know if you have any of these conditions: Anemia not caused by low iron levels Heart disease High levels of iron in the blood Kidney disease Liver disease An unusual or allergic reaction to iron, other medications, foods, dyes, or preservatives Pregnant or trying to get pregnant Breastfeeding How should I use this medication? This medication is for infusion into a vein. It is given in a hospital or clinic setting. Talk to your care team about the use of this medication in children. While this medication may be  prescribed for children as young as 2 years for selected conditions, precautions do apply. Overdosage: If you think you have taken too much of this medicine contact a poison control center or emergency room at once. NOTE: This medicine is only for you. Do not share this medicine with others. What if I miss a dose? Keep appointments for follow-up doses. It is important not to miss your dose. Call your care team if you are unable to keep an appointment. What may interact with this medication? Do not take this medication with any of the following: Deferoxamine Dimercaprol Other iron products This medication may also interact with the following: Chloramphenicol Deferasirox This list may not describe all possible interactions. Give your health care provider a list of all the medicines, herbs, non-prescription drugs, or dietary supplements you use. Also tell them if you smoke, drink alcohol, or use illegal drugs. Some items may interact with your medicine. What should I watch for while using this medication? Visit your care team regularly. Tell your care team if your symptoms do not start to get better or if they get worse. You may need blood work done while you are taking this medication. You may need to follow a special diet. Talk to your care team. Foods that contain iron include: whole grains/cereals, dried fruits, beans, or peas, leafy green vegetables, and organ meats (liver, kidney). What side effects may I notice from receiving this medication? Side effects that you should report to your care team as soon as possible: Allergic reactions--skin rash, itching, hives,  swelling of the face, lips, tongue, or throat Low blood pressure--dizziness, feeling faint or lightheaded, blurry vision Shortness of breath Side effects that usually do not require medical attention (report to your care team if they continue or are bothersome): Flushing Headache Joint pain Muscle pain Nausea Pain, redness, or  irritation at injection site This list may not describe all possible side effects. Call your doctor for medical advice about side effects. You may report side effects to FDA at 1-800-FDA-1088. Where should I keep my medication? This medication is given in a hospital or clinic and will not be stored at home. NOTE: This sheet is a summary. It may not cover all possible information. If you have questions about this medicine, talk to your doctor, pharmacist, or health care provider.  2023 Elsevier/Gold Standard (2020-07-08 00:00:00)    To help prevent nausea and vomiting after your treatment, we encourage you to take your nausea medication as directed.  BELOW ARE SYMPTOMS THAT SHOULD BE REPORTED IMMEDIATELY: *FEVER GREATER THAN 100.4 F (38 C) OR HIGHER *CHILLS OR SWEATING *NAUSEA AND VOMITING THAT IS NOT CONTROLLED WITH YOUR NAUSEA MEDICATION *UNUSUAL SHORTNESS OF BREATH *UNUSUAL BRUISING OR BLEEDING *URINARY PROBLEMS (pain or burning when urinating, or frequent urination) *BOWEL PROBLEMS (unusual diarrhea, constipation, pain near the anus) TENDERNESS IN MOUTH AND THROAT WITH OR WITHOUT PRESENCE OF ULCERS (sore throat, sores in mouth, or a toothache) UNUSUAL RASH, SWELLING OR PAIN  UNUSUAL VAGINAL DISCHARGE OR ITCHING   Items with * indicate a potential emergency and should be followed up as soon as possible or go to the Emergency Department if any problems should occur.  Please show the CHEMOTHERAPY ALERT CARD or IMMUNOTHERAPY ALERT CARD at check-in to the Emergency Department and triage nurse.  Should you have questions after your visit or need to cancel or reschedule your appointment, please contact [facility] [phone]  and follow the prompts.  Office hours are 8:00 a.m. to 4:30 p.m. Monday - Friday. Please note that voicemails left after 4:00 p.m. may not be returned until the following business day.  We are closed weekends and major holidays. You have access to  a nurse at all times for urgent questions. Please call the main number to the clinic [phone] and follow the prompts.  For any non-urgent questions, you may also contact your provider using MyChart. We now offer e-Visits for anyone 32 and older to request care online for non-urgent symptoms. For details visit [email or web address].   Also download the MyChart app! Go to the app store, search "MyChart", open the app, select [facility], and log in with your MyChart username and password.

## 2022-05-23 NOTE — Progress Notes (Signed)
Patient presents today for iron infusion.  Patient is in satisfactory condition with no complaints voiced.  Vital signs are stable.  Patient had Tylenol 1000 mg at 0645 this morning so we will hold Tylenol pre-medication.  We will proceed with infusion per provider orders.   Patient tolerated treatment well with no complaints voiced.  Patient left ambulatory in stable condition.  Vital signs stable at discharge.  Follow up as scheduled.

## 2022-05-25 ENCOUNTER — Telehealth: Payer: Self-pay

## 2022-05-25 NOTE — Telephone Encounter (Signed)
**Note De-Identified Lennis Rader Obfuscation** Jardiance PA started through covermymeds. Key: [id number]

## 2022-05-30 ENCOUNTER — Inpatient Hospital Stay: Payer: 59

## 2022-05-30 VITALS — BP 82/59 | HR 58 | Temp 97.7°F | Resp 17

## 2022-05-30 DIAGNOSIS — D509 Iron deficiency anemia, unspecified: Secondary | ICD-10-CM | POA: Diagnosis not present

## 2022-05-30 DIAGNOSIS — D508 Other iron deficiency anemias: Secondary | ICD-10-CM

## 2022-05-30 DIAGNOSIS — Z9884 Bariatric surgery status: Secondary | ICD-10-CM | POA: Diagnosis not present

## 2022-05-30 DIAGNOSIS — Z79899 Other long term (current) drug therapy: Secondary | ICD-10-CM | POA: Diagnosis not present

## 2022-05-30 MED ORDER — SODIUM CHLORIDE 0.9 % IV SOLN: Freq: Once | INTRAVENOUS | Status: AC

## 2022-05-30 MED ORDER — SODIUM CHLORIDE 0.9 % IV SOLN
500.0000 mg | Freq: Once | INTRAVENOUS | Status: AC
  Administered 2022-05-30: 500 mg via INTRAVENOUS
  Filled 2022-05-30: qty 20

## 2022-05-30 NOTE — Progress Notes (Signed)
Patient presents today for IV venofer, patient reports taking tylenol and zyrtec at home, pharmacy aware. Patient tolerated iron infusion with no complaints voiced. Peripheral IV site clean and dry with good blood return noted before and after infusion. Band aid applied. VSS with discharge and left in satisfactory condition with no s/s of distress noted.

## 2022-05-30 NOTE — Telephone Encounter (Signed)
**Note De-Identified Shanaya Schneck Obfuscation** Bria Cunningham (Key: [id number]) PA Case ID #: [id number] Rx #: [id number] Outcome Approved on February 15 Your PA request has been approved. Authorization Expiration Date: 05/25/2023 Drug Jardiance 10MG tablets ePA cloud logo Form [employer] Electronic PA Form [phone] NCPDP)  [facility] [address], [place] (Ph: [phone]) is aware of this approval.

## 2022-05-30 NOTE — Patient Instructions (Signed)
[facility]  Discharge Instructions: Thank you for choosing [facility] to provide your oncology and hematology care.  If you have a lab appointment with the [facility], please come in thru the Main Entrance and check in at the main information desk.  Wear comfortable clothing and clothing appropriate for easy access to any Portacath or PICC line.   We strive to give you quality time with your provider. You may need to reschedule your appointment if you arrive late (15 or more minutes).  Arriving late affects you and other patients whose appointments are after yours.  Also, if you miss three or more appointments without notifying the office, you may be dismissed from the clinic at the provider's discretion.      For prescription refill requests, have your pharmacy contact our office and allow 72 hours for refills to be completed.    Today you received the following Venofer, return as scheduled.   To help prevent nausea and vomiting after your treatment, we encourage you to take your nausea medication as directed.  BELOW ARE SYMPTOMS THAT SHOULD BE REPORTED IMMEDIATELY: *FEVER GREATER THAN 100.4 F (38 C) OR HIGHER *CHILLS OR SWEATING *NAUSEA AND VOMITING THAT IS NOT CONTROLLED WITH YOUR NAUSEA MEDICATION *UNUSUAL SHORTNESS OF BREATH *UNUSUAL BRUISING OR BLEEDING *URINARY PROBLEMS (pain or burning when urinating, or frequent urination) *BOWEL PROBLEMS (unusual diarrhea, constipation, pain near the anus) TENDERNESS IN MOUTH AND THROAT WITH OR WITHOUT PRESENCE OF ULCERS (sore throat, sores in mouth, or a toothache) UNUSUAL RASH, SWELLING OR PAIN  UNUSUAL VAGINAL DISCHARGE OR ITCHING   Items with * indicate a potential emergency and should be followed up as soon as possible or go to the Emergency Department if any problems should occur.  Please show the CHEMOTHERAPY ALERT CARD or IMMUNOTHERAPY ALERT CARD at check-in to the Emergency Department and triage  nurse.  Should you have questions after your visit or need to cancel or reschedule your appointment, please contact [facility] [phone]  and follow the prompts.  Office hours are 8:00 a.m. to 4:30 p.m. Monday - Friday. Please note that voicemails left after 4:00 p.m. may not be returned until the following business day.  We are closed weekends and major holidays. You have access to a nurse at all times for urgent questions. Please call the main number to the clinic [phone] and follow the prompts.  For any non-urgent questions, you may also contact your provider using MyChart. We now offer e-Visits for anyone 75 and older to request care online for non-urgent symptoms. For details visit [email or web address].   Also download the MyChart app! Go to the app store, search "MyChart", open the app, select [facility], and log in with your MyChart username and password.

## 2022-06-05 NOTE — Progress Notes (Signed)
Remote pacemaker transmission.   

## 2022-06-06 IMAGING — MG MM DIGITAL DIAGNOSTIC UNILAT*R* W/ TOMO W/ CAD
6 series · 6 of 18 positions shown · non-contrast
Comparison: Previous exam(s).

CLINICAL DATA: 60-year-old female with 2 weeks of tenderness and
light brown discharge from the right nipple. The patient was started
on a course of antibiotics with complete resolution of the discharge
and improvement of the pain.

EXAM:
DIGITAL DIAGNOSTIC UNILATERAL RIGHT MAMMOGRAM WITH TOMOSYNTHESIS AND
CAD; ULTRASOUND RIGHT BREAST LIMITED
TECHNIQUE: Right digital diagnostic mammography and breast tomosynthesis was
performed. The images were evaluated with computer-aided detection.;
Targeted ultrasound examination of the right breast was performed

[R CC synth-2D (1 of 2)]
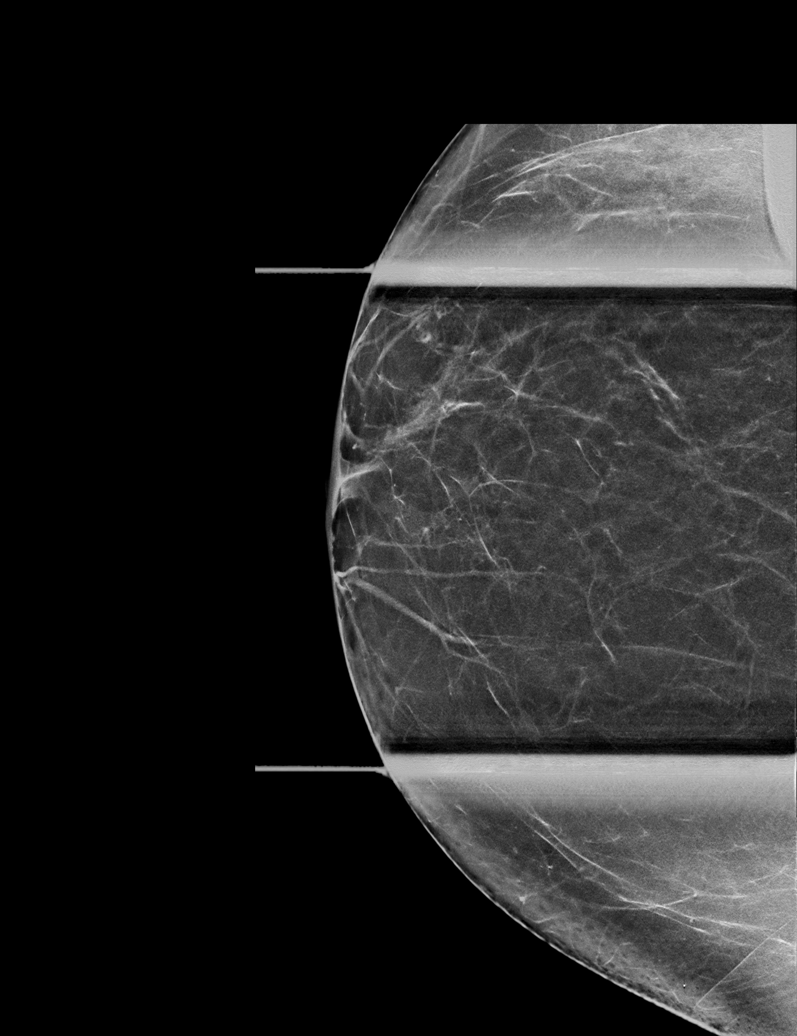

[R MLO synth-2D]
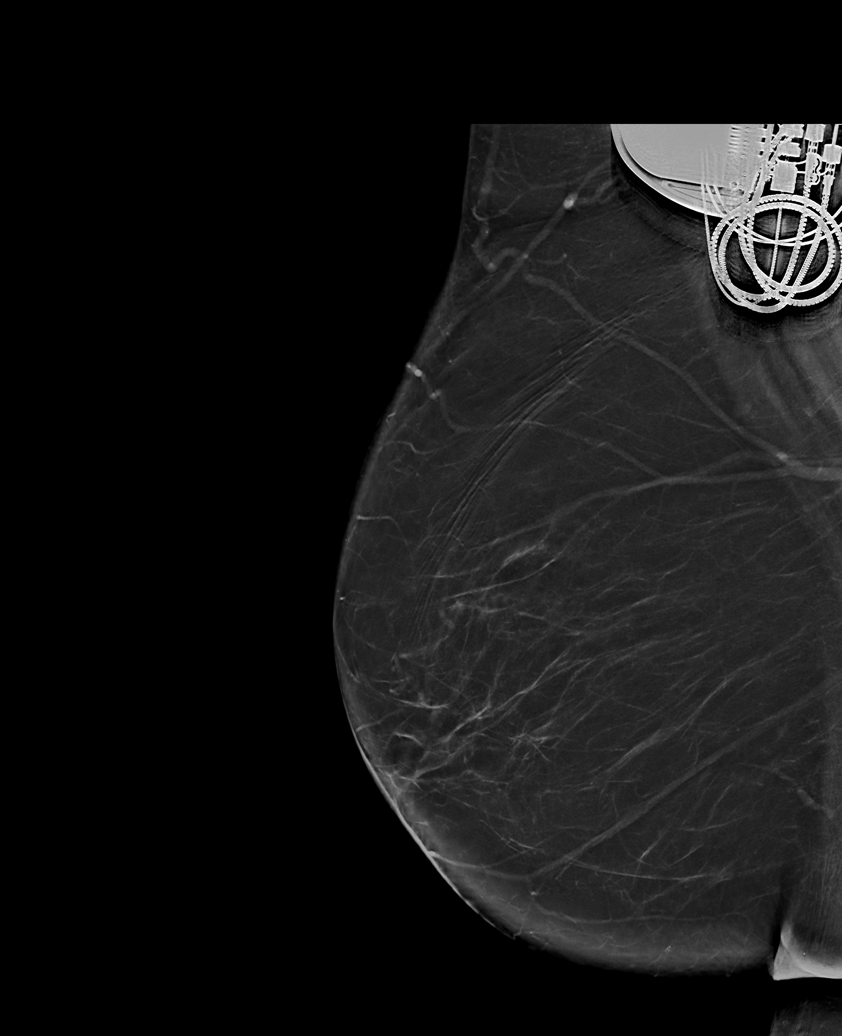

[R CC synth-2D (2 of 2)]
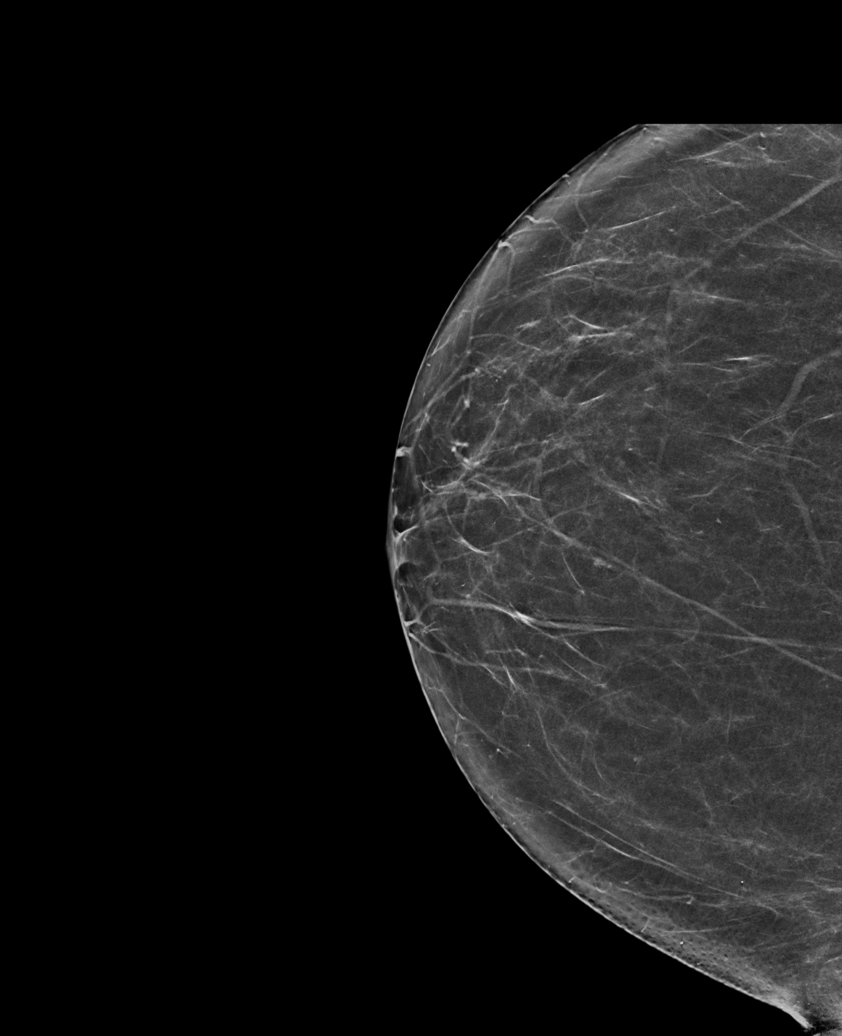

[R CC tomo (1 of 2) · tomo slice 31/61.0]
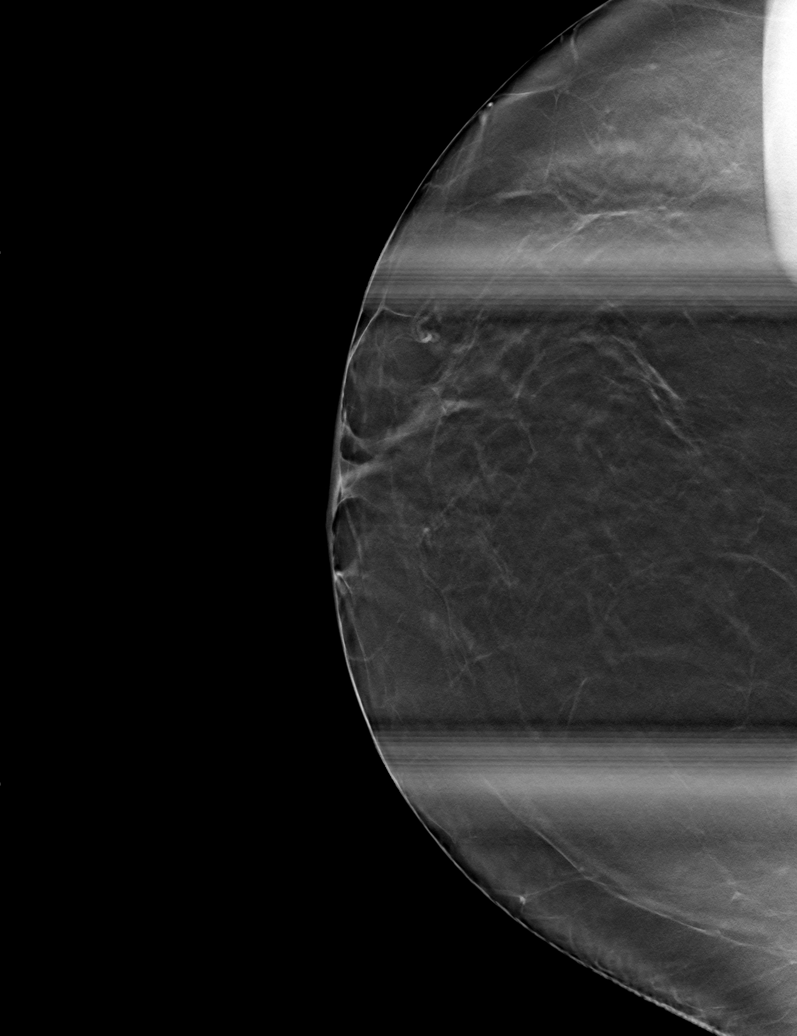

[R MLO tomo · tomo slice 47/92.0]
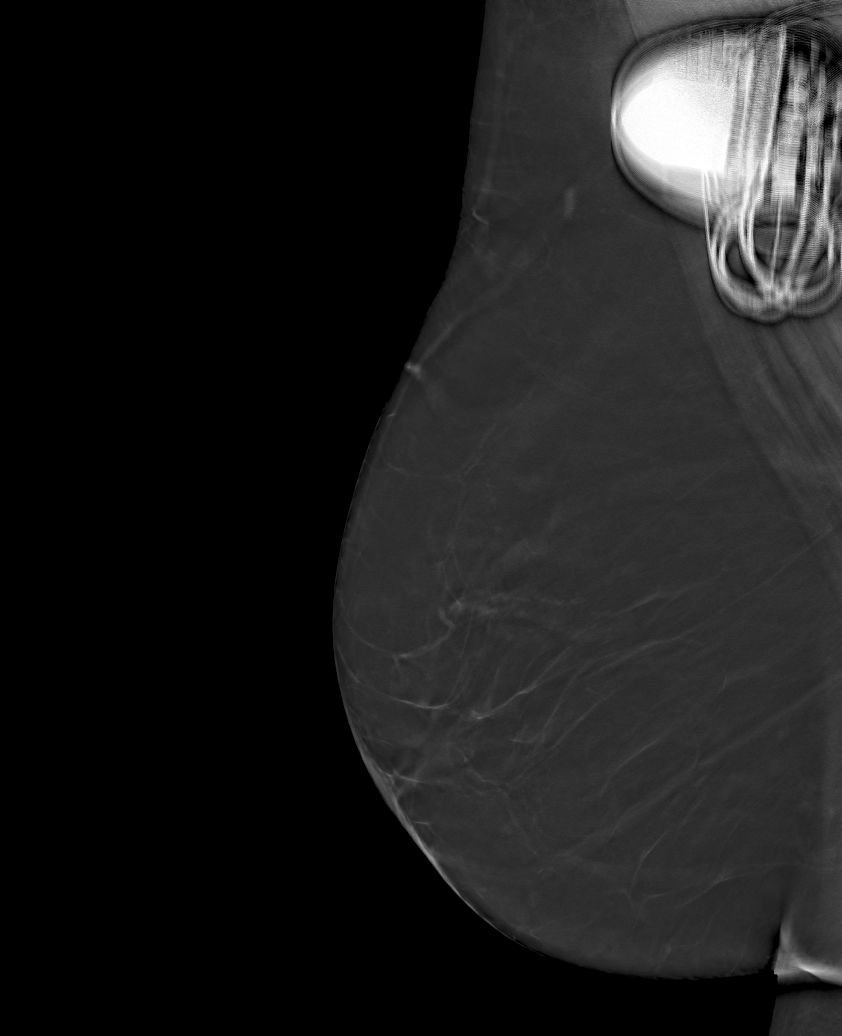

[R CC tomo (2 of 2) · tomo slice 33/66.0]
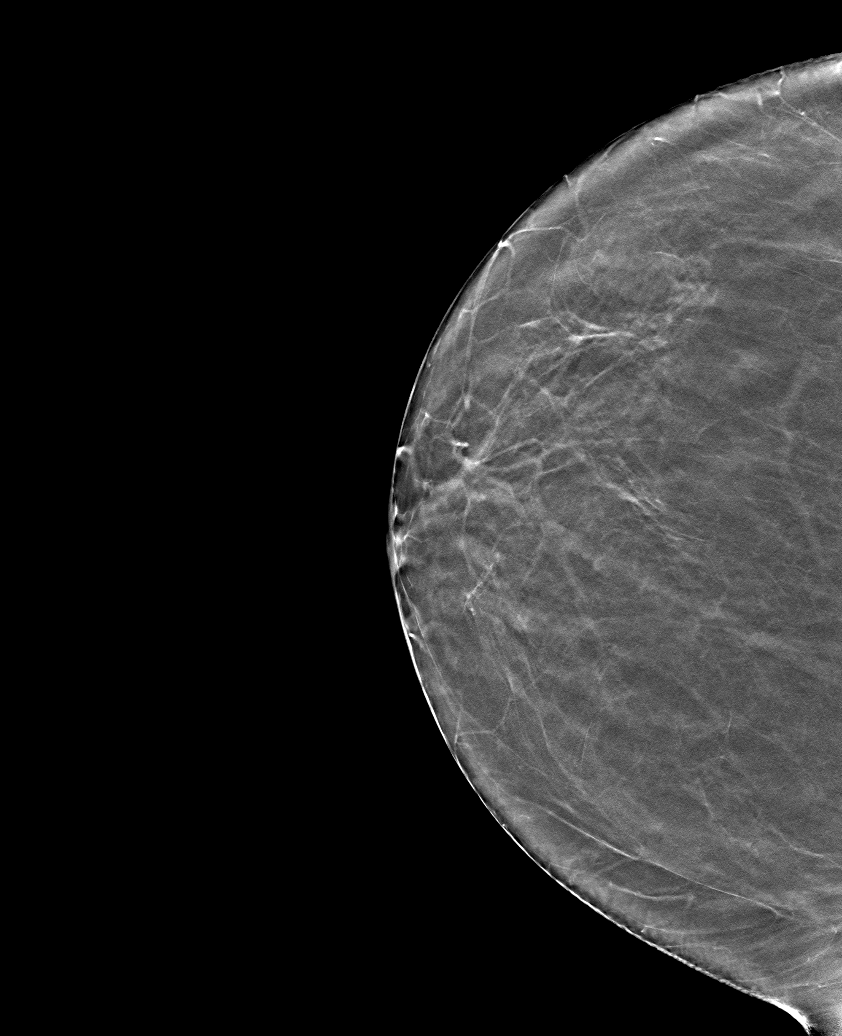

[6 of 18 positions shown; findings below may reference images not displayed]

ACR Breast Density Category b: There are scattered areas of
fibroglandular density.
FINDINGS: No focal or suspicious mammographic findings are identified within
the right breast. The parenchymal pattern is stable.

Targeted ultrasound is performed, showing no focal or suspicious
sonographic findings in the subareolar right breast.
IMPRESSION: Unremarkable mammographic and sonographic evaluation of the right
breast.

RECOMMENDATION:
1. Causes of unilateral nipple discharge include: Hormonal changes,
fibrocystic changes, benign papilloma, abscess/mastitis, birth
control pills, endocrine disorders, injury/trauma to breast, duct
ectasia, medications, prolactinoma, and breast cancer. As is evident
from this list, nipple discharge often stems from a benign
condition. However, breast cancer is a possibility when unilateral
spontaneous persistent single duct discharge (especially bloody or
clear discharge) is present. Recommend continued clinical and
symptomatic follow-up.
2. The patient may return to routine annual mammography in September 2021.

I have discussed the findings and recommendations with the patient.
If applicable, a reminder letter will be sent to the patient
regarding the next appointment.

BI-RADS CATEGORY  1: Negative.

## 2022-06-08 ENCOUNTER — Telehealth: Payer: Self-pay

## 2022-06-08 NOTE — Telephone Encounter (Signed)
**Note De-Identified Sherrie Marsan Obfuscation** We received a Entresto 24-26 mg RX form from [facility].  I have completed the form for #180 with 3 refills and have e-mailed it to the nurse working with Dr Caryl Comes on Monday 03/04 so he can obtain his signature, date it, and to then fax it to [facility] at the fax number circled at the top of the page.

## 2022-06-12 NOTE — Telephone Encounter (Signed)
Entresto refill signed by Dr. Virl Axe, on 06/12/2022 at 10 am.  Faxed to [facility] at 843am on 3/4.

## 2022-06-14 ENCOUNTER — Other Ambulatory Visit: Payer: Self-pay

## 2022-06-14 MED ORDER — ENTRESTO 24-26 MG PO TABS: 1.0000 | ORAL_TABLET | Freq: Two times a day (BID) | ORAL | 3 refills | Status: DC

## 2022-07-03 ENCOUNTER — Ambulatory Visit (INDEPENDENT_AMBULATORY_CARE_PROVIDER_SITE_OTHER): Payer: 59 | Admitting: Family Medicine

## 2022-07-03 ENCOUNTER — Encounter: Payer: Self-pay | Admitting: Family Medicine

## 2022-07-03 VITALS — BP 113/76 | HR 96 | Temp 98.1°F | Ht 65.0 in | Wt 260.0 lb

## 2022-07-03 DIAGNOSIS — M25562 Pain in left knee: Secondary | ICD-10-CM | POA: Diagnosis not present

## 2022-07-03 DIAGNOSIS — M25561 Pain in right knee: Secondary | ICD-10-CM | POA: Diagnosis not present

## 2022-07-03 DIAGNOSIS — G8929 Other chronic pain: Secondary | ICD-10-CM | POA: Diagnosis not present

## 2022-07-03 DIAGNOSIS — M2550 Pain in unspecified joint: Secondary | ICD-10-CM

## 2022-07-03 MED ORDER — TRAMADOL HCL 50 MG PO TABS: 50.0000 mg | ORAL_TABLET | Freq: Three times a day (TID) | ORAL | 1 refills | Status: DC | PRN

## 2022-07-03 NOTE — Patient Instructions (Signed)
Tramadol as needed.  Labs ordered.  Consider ortho or pain management.

## 2022-07-04 DIAGNOSIS — M2550 Pain in unspecified joint: Secondary | ICD-10-CM | POA: Insufficient documentation

## 2022-07-04 NOTE — Assessment & Plan Note (Signed)
After discussion today, we elected to obtain some laboratory studies to ensure that there was no underlying rheumatologic component.

## 2022-07-04 NOTE — Progress Notes (Signed)
Subjective:  Patient ID: [name] [name], female    DOB: 02/24/1961  Age: 62 y.o. MRN: [id number]  CC: Chief Complaint  Patient presents with   Leg Pain    Hip and knees both legs chronic but worsened     HPI:  62 year old female with morbid obesity with prior bariatric surgery, iron deficiency anemia, nonischemic cardiomyopathy, atrial fibrillation presents for evaluation of the above.  Patient continues to have pain of both hips and both knees.  She also reports that her pain seems to go down to the anterior lower legs and ankles.  Patient cannot use NSAIDs due to history of back surgery as well as congestive heart failure and underlying iron deficiency anemia.  Patient is using Tylenol regularly without significant improvement.  She reports that she has times where she has to sit down and rest due to the pain.  I have previously prescribed tramadol for her which she states helped.  However, only a short supply was given.  Prior imaging has revealed mild underlying arthritis.  Obesity contributing as well.  Patient Active Problem List   Diagnosis Date Noted   Polyarthralgia 07/04/2022   Morbid obesity ([facility]) 05/16/2022   Chronic pain of both knees 05/16/2022   Chronic pain of both hips 05/16/2022   History of complete AV block 05/15/2022   Atrial fibrillation ([facility]) 05/15/2022   Chronic combined systolic and diastolic CHF (congestive heart failure) ([facility]) 05/15/2022   Iron deficiency anemia    Atrial tachycardia 04/26/2019   NICM (nonischemic cardiomyopathy) ([facility]) 04/26/2019   PACEMAKER,MDT 03/02/2009    Social Hx   Social History   Socioeconomic History   Marital status: Married    Spouse name: Not on file   Number of children: Not on file   Years of education: Not on file   Highest education level: Not on file  Occupational History   Not on file  Tobacco Use   Smoking status: Never   Smokeless tobacco: Never  Vaping Use   Vaping Use: Never used  Substance and  Sexual Activity   Alcohol use: Not Currently    Alcohol/week: 7.0 standard drinks of alcohol    Types: 7 Cans of beer per week   Drug use: No   Sexual activity: Not on file  Other Topics Concern   Not on file  Social History Narrative   She is retired from working as an Therapist, sports at [employer], 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   Social Determinants of Health   Financial Resource Strain: Not on file  Food Insecurity: No Food Insecurity (05/19/2022)   Hunger Vital Sign    Worried About Running Out of Food in the Last Year: Never true    Ran Out of Food in the Last Year: Never true  Transportation Needs: No Transportation Needs (05/19/2022)   PRAPARE - Hydrologist (Medical): No    Lack of Transportation (Non-Medical): No  Physical Activity: Not on file  Stress: Not on file  Social Connections: Not on file    Review of Systems Per HPI  Objective:  BP 113/76   Pulse 96   Temp 98.1 F (36.7 C)   Ht 5\' 5"  (1.651 m)   Wt 260 lb (117.9 kg)   SpO2 97%   BMI 43.27 kg/m      07/03/2022    4:24 PM 05/30/2022    3:07 PM 05/30/2022   10:16 AM  BP/Weight  Systolic BP  [id number]  Diastolic BP 76 59 71  Wt. (Lbs) 260    BMI 43.27 kg/m2      Physical Exam Vitals and nursing note reviewed.  Constitutional:      Appearance: Normal appearance. She is obese.  HENT:     Head: Normocephalic and atraumatic.  Eyes:     General:        Right eye: No discharge.        Left eye: No discharge.     Conjunctiva/sclera: Conjunctivae normal.  Pulmonary:     Effort: Pulmonary effort is normal. No respiratory distress.  Neurological:     Mental Status: She is alert.  Psychiatric:        Mood and Affect: Mood normal.        Behavior: Behavior normal.     Lab Results  Component Value Date   WBC 6.5 05/15/2022   HGB 10.3 (L) 05/15/2022   HCT 34.5 05/15/2022   PLT 312 05/15/2022   GLUCOSE 104 (H) 05/15/2022   CHOL 204 (H) 05/15/2022    TRIG 106 05/15/2022   HDL 71 05/15/2022   LDLCALC 114 (H) 05/15/2022   ALT 19 05/15/2022   AST 28 05/15/2022   NA 136 05/15/2022   K 4.6 05/15/2022   CL 95 (L) 05/15/2022   CREATININE 1.11 (H) 05/15/2022   BUN 18 05/15/2022   CO2 25 05/15/2022   INR 1.1 RATIO (H) 05/05/2008   HGBA1C 5.8 (H) 05/15/2022     Assessment & Plan:   Problem List Items Addressed This Visit       Other   Chronic pain of both knees - Primary    Worsening, uncontrolled.  We discussed NSAIDs not being a good option.  We also discussed seeing an orthopedist or pain management physician.  We discussed treatment options and she elected to proceed with tramadol.  Advised patient that she needs to be careful with this medication due to possible serotonin syndrome due to her other medications.  Patient is aware.  Weight loss would be helpful.      Relevant Medications   traMADol (ULTRAM) 50 MG tablet   Polyarthralgia    After discussion today, we elected to obtain some laboratory studies to ensure that there was no underlying rheumatologic component.      Relevant Orders   Sedimentation Rate   CYCLIC CITRUL PEPTIDE ANTIBODY, IGG/IGA   Rheumatoid factor   ANA    Meds ordered this encounter  Medications   traMADol (ULTRAM) 50 MG tablet    Sig: Take 1 tablet (50 mg total) by mouth every 8 (eight) hours as needed.    Dispense:  30 tablet    Refill:  1    Follow-up:  Return if symptoms worsen or fail to improve.  [place]

## 2022-07-04 NOTE — Assessment & Plan Note (Addendum)
Worsening, uncontrolled.  We discussed NSAIDs not being a good option.  We also discussed seeing an orthopedist or pain management physician.  We discussed treatment options and she elected to proceed with tramadol.  Advised patient that she needs to be careful with this medication due to possible serotonin syndrome due to her other medications.  Patient is aware.  Weight loss would be helpful.

## 2022-07-10 DIAGNOSIS — M2550 Pain in unspecified joint: Secondary | ICD-10-CM | POA: Diagnosis not present

## 2022-07-11 ENCOUNTER — Telehealth: Payer: Self-pay | Admitting: Family Medicine

## 2022-07-11 NOTE — Telephone Encounter (Signed)
Highly recommend office visit in order to examine this area, may well need diagnostic mammogram and ultrasound to be on the safe side, please schedule, if [name] [name] has open please do so or with Dr. Lacinda Axon if patient perfors

## 2022-07-11 NOTE — Telephone Encounter (Signed)
Patient found a lump in her right breast last night and wanting to know what she should do? Please advise.

## 2022-07-12 LAB — RHEUMATOID FACTOR: Rheumatoid fact SerPl-aCnc: 12.3 IU/mL (ref <14.0)

## 2022-07-12 LAB — SEDIMENTATION RATE: Sed Rate: 13 mm/hr (ref 0–40)

## 2022-07-12 LAB — CYCLIC CITRUL PEPTIDE ANTIBODY, IGG/IGA: Cyclic Citrullin Peptide Ab: 8 units (ref 0–19)

## 2022-07-12 LAB — ANA: Anti Nuclear Antibody (ANA): NEGATIVE

## 2022-07-14 ENCOUNTER — Ambulatory Visit (INDEPENDENT_AMBULATORY_CARE_PROVIDER_SITE_OTHER): Payer: 59 | Admitting: Nurse Practitioner

## 2022-07-14 VITALS — BP 130/80 | HR 85 | Temp 98.1°F | Ht 65.0 in | Wt 256.0 lb

## 2022-07-14 DIAGNOSIS — N6341 Unspecified lump in right breast, subareolar: Secondary | ICD-10-CM | POA: Diagnosis not present

## 2022-07-14 NOTE — Progress Notes (Unsigned)
   Subjective:    Patient ID: [name] [name], female    DOB: 04/13/1960, 62 y.o.   MRN: [id number]  HPI Anise is being seen today with a lump in the right breast. She found it a few days ago. It is tender to touch. Reports an inverted nipple as well. Over 1.5 years ago, she did have some nipple drainage in the right breast. Diagnostic mammogram and ultrasound was done, and was normal. Was prescribed antibiotics and nipple drainage cleared up, and has not returned. Her last mammogram was 03/30/2022, and was normal. She does have a family history of breast cancer with two of her sisters.   Review of Systems  Constitutional:  Negative for fatigue and fever.  Respiratory:  Negative for cough, chest tightness, shortness of breath and wheezing.       Objective:   Physical Exam Vitals and nursing note reviewed. Exam conducted with a chaperone present.  Constitutional:      Appearance: She is not ill-appearing.  Cardiovascular:     Rate and Rhythm: Normal rate and regular rhythm.     Pulses: Normal pulses.     Heart sounds: Normal heart sounds. No murmur heard. Chest:  Breasts:    Right: Inverted nipple and mass present. No nipple discharge, skin change or tenderness.     Left: No inverted nipple, mass, nipple discharge, skin change or tenderness.     Comments: 1 cm, mobile cyst found in the right breast at the 6 'o' clock position in relevance to the areola.Tender to touch. No enlarged axillary lymph nodes.  Lymphadenopathy:     Upper Body:     Right upper body: No axillary adenopathy.     Left upper body: No axillary adenopathy.  Neurological:     Mental Status: She is alert.    Vitals:   07/14/22 1521  BP: 130/80  Pulse: 85  Temp: 98.1 F (36.7 C)  Height: 5\' 5"  (1.651 m)  Weight: 256 lb (116.1 kg)  SpO2: 98%  BMI (Calculated): 42.6       Assessment & Plan:  1. Lump in central portion of right breast - MM Digital Diagnostic Bilat - [place] LIMITED ULTRASOUND INCLUDING  AXILLA LEFT BREAST  - [place] LIMITED ULTRASOUND INCLUDING AXILLA RIGHT BREAST   Return for as needed.

## 2022-07-14 NOTE — Progress Notes (Unsigned)
   Subjective:    Patient ID: [name] [name], female    DOB: 1960/04/19, 62 y.o.   MRN: [id number]  HPI  Knot palpated in right breast tender to touch  Review of Systems     Objective:   Physical Exam        Assessment & Plan:

## 2022-07-16 ENCOUNTER — Encounter: Payer: Self-pay | Admitting: Nurse Practitioner

## 2022-07-17 ENCOUNTER — Encounter: Payer: Self-pay | Admitting: Family Medicine

## 2022-07-18 ENCOUNTER — Other Ambulatory Visit: Payer: Self-pay | Admitting: Family Medicine

## 2022-07-18 DIAGNOSIS — M2550 Pain in unspecified joint: Secondary | ICD-10-CM

## 2022-07-20 ENCOUNTER — Other Ambulatory Visit: Payer: Self-pay | Admitting: *Deleted

## 2022-07-20 DIAGNOSIS — D508 Other iron deficiency anemias: Secondary | ICD-10-CM | POA: Diagnosis not present

## 2022-07-20 DIAGNOSIS — M2550 Pain in unspecified joint: Secondary | ICD-10-CM | POA: Diagnosis not present

## 2022-07-20 NOTE — Addendum Note (Signed)
Addended by: Cynda [place] A on: 07/20/2022 10:45 AM   Modules accepted: Orders

## 2022-07-21 LAB — URIC ACID: Uric Acid: 5.3 mg/dL (ref 3.0–7.2)

## 2022-07-26 ENCOUNTER — Ambulatory Visit: Payer: 59 | Admitting: Physician Assistant

## 2022-07-26 ENCOUNTER — Inpatient Hospital Stay: Payer: 59 | Attending: Hematology | Admitting: Physician Assistant

## 2022-07-26 VITALS — BP 104/79 | HR 80 | Temp 97.9°F | Resp 16 | Wt 254.6 lb

## 2022-07-26 DIAGNOSIS — Z9884 Bariatric surgery status: Secondary | ICD-10-CM | POA: Diagnosis not present

## 2022-07-26 DIAGNOSIS — D509 Iron deficiency anemia, unspecified: Secondary | ICD-10-CM | POA: Insufficient documentation

## 2022-07-26 DIAGNOSIS — E559 Vitamin D deficiency, unspecified: Secondary | ICD-10-CM

## 2022-07-26 DIAGNOSIS — D508 Other iron deficiency anemias: Secondary | ICD-10-CM

## 2022-07-26 MED ORDER — VITAMIN D 25 MCG (1000 UNIT) PO TABS
1000.0000 [IU] | ORAL_TABLET | Freq: Every day | ORAL | 2 refills | Status: AC
Start: 2022-07-26 — End: ?

## 2022-07-26 NOTE — Progress Notes (Signed)
[facility] [address], [name] [address]   CLINIC:  Medical Oncology/Hematology  PCP:  [name], DO [address] [name] [id number] [phone]   REASON FOR VISIT:  Follow-up for iron deficiency anemia  CURRENT THERAPY: IV iron  INTERVAL HISTORY:   [name] 62 y.o. female returns for routine follow-up of severe iron deficiency anemia.  She was last seen by Dr. Ellin Saba on 05/19/2022.  She received Venofer 500 mg x 2 doses on 05/23/2022 and 05/30/2022  She felt much better after her IV iron, with only mild residual fatigue.  She denies any pica, restless legs, headaches, chest pain, dyspnea, lightheadedness, or syncope.  She has not noticed any rectal bleeding, melena, epistaxis, or hematemesis.  She has 70% energy and 100% appetite. She endorses that she is maintaining a stable weight.   ASSESSMENT & PLAN:  1.  Severe iron deficiency anemia: - Referred by PCP (Dr. Everlene Other) for evaluation and treatment of iron deficiency anemia after labs from 05/15/2022 showed ferritin 14, iron saturation 5%, hemoglobin 10.3/MCV 74 - She had gastric bypass surgery in 2005 - Unable to tolerate iron pill due to constipation.  Required previous IV iron infusions at [facility] / [facility]. - No history of blood transfusions. - EGD (12/28/2020): Overall normal with widely patent gastrojejunal anastomosis - Colonoscopy (12/28/2020): Limited due to poor prep, but no gross lesions noted - Most recent IV iron with Venofer 500 mg x 2 doses on 05/23/2022 and 05/30/2022 - Denies any rectal bleeding or melena  - Most recent labs (07/20/2022): Hgb 15.4/MCV 83.  Iron saturation 24%, ferritin 85 - DIFFERENTIAL DIAGNOSIS favors iron deficiency anemia secondary to malabsorption s/p gastric bypass surgery - PLAN: No indication for IV iron at this time.  Will recheck CBC and iron panel in 3 months.  2.  Nutritional deficiencies secondary to history gastric bypass  surgery - Most recent labs (07/20/2022): Normal folate >20 Normal copper 127 Normal B12 574 (MMA pending) Vitamin D low at 25.9 - She takes multivitamin at home daily.   - PLAN: Recommend starting OTC vitamin D3 1000 units daily.  Will recheck vitamin D levels at follow-up in 3 months.  3.  Social/family history: - PMH includes congestive heart failure and cardiomyopathy - Independent of ADLs and IADLs.  She is a retired Charity fundraiser, worked at [employer]. - Lifelong non-smoker. - No family history of anemia. - Sister died of breast cancer.  Another sister had glioblastoma.  Brother had multiple myeloma.  Another sister had breast cancer.  PLAN SUMMARY: >> Labs in 3 months via [facility] = CBC/D, BMP, ferritin, iron/TIBC, vitamin D >> PHONE visit in 3 months     REVIEW OF SYSTEMS:   Review of Systems  Constitutional:  Positive for fatigue (mild). Negative for appetite change, chills, diaphoresis, fever and unexpected weight change.  HENT:   Negative for lump/mass and nosebleeds.   Eyes:  Negative for eye problems.  Respiratory:  Negative for cough, hemoptysis and shortness of breath.   Cardiovascular:  Negative for chest pain, leg swelling and palpitations.  Gastrointestinal:  Negative for abdominal pain, blood in stool, constipation, diarrhea, nausea and vomiting.  Genitourinary:  Negative for hematuria.   Musculoskeletal:  Positive for arthralgias.  Skin: Negative.   Neurological:  Negative for dizziness, headaches and light-headedness.  Hematological:  Does not bruise/bleed easily.     PHYSICAL EXAM:  ECOG PERFORMANCE STATUS: 1 - Symptomatic but completely ambulatory  There were  no vitals filed for this visit. There were no vitals filed for this visit. Physical Exam Constitutional:      Appearance: Normal appearance. She is morbidly obese.  Cardiovascular:     Heart sounds: Normal heart sounds.  Pulmonary:     Breath sounds: Normal breath sounds.  Neurological:     General: No focal  deficit present.     Mental Status: Mental status is at baseline.  Psychiatric:        Behavior: Behavior normal. Behavior is cooperative.     PAST MEDICAL/SURGICAL HISTORY:  Past Medical History:  Diagnosis Date   Anemia    Atrial fibrillation    Chronic diastolic heart failure    EF 40-45%   Depression    history of depression in the past.   Dysrhythmia    GERD (gastroesophageal reflux disease)    Mitral valvular regurgitation    Pacemaker- medtronic    Initial PM 1995; generator 2009, infection with contralateral implant 2010   S/P AV nodal ablation    Shortness of breath dyspnea    Past Surgical History:  Procedure Laterality Date   ABDOMINAL ADHESION SURGERY  04/2006   BIV UPGRADE N/A 10/13/2020   Procedure: BIV UPGRADE;  Surgeon: [name], MD;  Location: [facility] INVASIVE CV LAB;  Service: Cardiovascular;  Laterality: N/A;   CARDIAC CATHETERIZATION  09/11/2007   CARDIAC PACEMAKER PLACEMENT  1995   [facility],Medtronic Adapta ADDRL1   CARDIOVERSION N/A 04/19/2022   Procedure: CARDIOVERSION;  Surgeon: Christell Constant, MD;  Location: [facility] ENDOSCOPY;  Service: Cardiovascular;  Laterality: N/A;   CATARACT EXTRACTION, BILATERAL     COLONOSCOPY WITH PROPOFOL N/A 12/28/2020   Procedure: COLONOSCOPY WITH PROPOFOL;  Surgeon: Franky Macho, MD;  Location: [facility] ENDO SUITE;  Service: Gastroenterology;  Laterality: N/A;   ESOPHAGOGASTRODUODENOSCOPY (EGD) WITH PROPOFOL N/A 12/28/2020   Procedure: ESOPHAGOGASTRODUODENOSCOPY (EGD) WITH PROPOFOL;  Surgeon: Franky Macho, MD;  Location: [facility] END[facility];  Service: Gastroenterology;  Laterality: N/A;   GASTRIC BYPASS  09/2003   LEFT HEART CATH AND CORONARY ANGIOGRAPHY N/A 07/18/2016   Procedure: Left Heart Cath and Coronary Angiography;  Surgeon: Lennette Bihari, MD;  Location: [facility] INVASIVE CV LAB;  Service: Cardiovascular;  Laterality: N/A;   TEE WITHOUT CARDIOVERSION N/A 03/19/2014   Procedure: TRANSESOPHAGEAL ECHOCARDIOGRAM  (TEE);  Surgeon: Antoine Poche, MD;  Location: [facility] ENDO [facility];  Service: Endoscopy;  Laterality: N/A;   TEE WITHOUT CARDIOVERSION N/A 05/21/2018   Procedure: TRANSESOPHAGEAL ECHOCARDIOGRAM (TEE);  Surgeon: Antoine Poche, MD;  Location: [facility];  Service: Endoscopy;  Laterality: N/A;   TEE WITHOUT CARDIOVERSION N/A 01/29/2020   Procedure: TRANSESOPHAGEAL ECHOCARDIOGRAM (TEE) WITH PROPOFOL;  Surgeon: Antoine Poche, MD;  Location: [facility];  Service: Endoscopy;  Laterality: N/A;  scheduled by Mindi Junker @ Dr. Wilmon Arms office    SOCIAL HISTORY:  Social History   Socioeconomic History   Marital status: Married    Spouse name: Not on file   Number of children: Not on file   Years of education: Not on file   Highest education level: Bachelor's degree (e.g., BA, AB, BS)  Occupational History   Not on file  Tobacco Use   Smoking status: Never   Smokeless tobacco: Never  Vaping Use   Vaping Use: Never used  Substance and Sexual Activity   Alcohol use: Not Currently    Alcohol/week: 7.0 standard drinks of alcohol    Types: 7 Cans of beer per week   Drug  use: No   Sexual activity: Not on file  Other Topics Concern   Not on file  Social History Narrative   She is retired from working as an Charity fundraiser at [employer], 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   Social Determinants of Health   Financial Resource Strain: Low Risk  (07/14/2022)   Overall Financial Resource Strain (CARDIA)    Difficulty of Paying Living Expenses: Not hard at all  Food Insecurity: No Food Insecurity (07/14/2022)   Hunger Vital Sign    Worried About Running Out of Food in the Last Year: Never true    Ran Out of Food in the Last Year: Never true  Transportation Needs: No Transportation Needs (07/14/2022)   PRAPARE - Administrator, Civil Service (Medical): No    Lack of Transportation (Non-Medical): No  Physical Activity: Unknown (07/14/2022)   Exercise Vital Sign    Days of  Exercise per Week: 0 days    Minutes of Exercise per Session: Not on file  Stress: No Stress Concern Present (07/14/2022)   [employer] of Occupational Health - Occupational Stress Questionnaire    Feeling of Stress : Not at all  Social Connections: Socially Integrated (07/14/2022)   Social Connection and Isolation Panel [NHANES]    Frequency of Communication with Friends and Family: More than three times a week    Frequency of Social Gatherings with Friends and Family: Once a week    Attends Religious Services: More than 4 times per year    Active Member of [employer] or Organizations: Yes    Attends Banker Meetings: More than 4 times per year    Marital Status: Married  Catering manager Violence: Not At Risk (05/19/2022)   Humiliation, Afraid, Rape, and Kick questionnaire    Fear of Current or Ex-Partner: No    Emotionally Abused: No    Physically Abused: No    Sexually Abused: No    FAMILY HISTORY:  Family History  Problem Relation Age of Onset   Diabetes Mother    Hypertension Mother    Stroke Mother    Heart failure Mother    Cancer - Prostate Father    Breast cancer Sister    Arthritis-Osteo Sister    Hypertension Sister    Diabetes Sister    CAD Sister    Arthritis Sister    Hypertension Sister    Arthritis-Osteo Sister    Atrial fibrillation Sister    Breast cancer Sister    Pneumonia Sister    Osteoporosis Sister    Hypertension Sister    Mitral valve prolapse Sister    Arthritis-Osteo Sister    Fibromyalgia Sister    Depression Sister    Brain cancer Brother        glioblastoma   Hypertension Brother    CAD Brother    Heart attack Brother    Hypertension Brother    CAD Brother    Heart attack Brother     CURRENT MEDICATIONS:  Outpatient Encounter Medications as of 07/26/2022  Medication Sig   atorvastatin (LIPITOR) 40 MG tablet Take 1 tablet (40 mg total) by mouth daily.   carvedilol (COREG) 3.125 MG tablet Take 6.25 mg by mouth 2 (two)  times daily.   DULoxetine (CYMBALTA) 20 MG capsule Take 20 mg by mouth 2 (two) times daily.   empagliflozin (JARDIANCE) 10 MG TABS tablet Take 1 tablet (10 mg total) by mouth daily.   Melatonin 10 MG TABS Take  20 mg by mouth at bedtime.    Multiple Vitamins-Minerals (MULTIVITAMIN WITH MINERALS) tablet Take 1 tablet by mouth daily. With Iron   sacubitril-valsartan (ENTRESTO) 24-26 MG Take 1 tablet by mouth 2 (two) times daily.   spironolactone (ALDACTONE) 25 MG tablet Take 1/2 (one-half) tablet by mouth once daily   torsemide (DEMADEX) 20 MG tablet Take 3 tablets (60 mg total) by mouth daily.   traMADol (ULTRAM) 50 MG tablet Take 1 tablet (50 mg total) by mouth every 8 (eight) hours as needed.   traZODone (DESYREL) 100 MG tablet Take 200 mg by mouth at bedtime.    No facility-administered encounter medications on file as of 07/26/2022.    ALLERGIES:  No Known Allergies  LABORATORY DATA:  I have reviewed the labs as listed.  CBC    Component Value Date/Time   WBC 6.5 05/15/2022 1534   WBC 10.1 07/25/2021 1013   RBC 4.68 05/15/2022 1534   RBC 4.13 07/25/2021 1013   HGB 10.3 (L) 05/15/2022 1534   HCT 34.5 05/15/2022 1534   PLT 312 05/15/2022 1534   MCV 74 (L) 05/15/2022 1534   MCH 22.0 (L) 05/15/2022 1534   MCH 23.2 (L) 07/25/2021 1013   MCHC 29.9 (L) 05/15/2022 1534   MCHC 29.3 (L) 07/25/2021 1013   RDW 20.2 (H) 05/15/2022 1534   LYMPHSABS 2.0 12/24/2020 1539   MONOABS 0.5 12/24/2020 1539   EOSABS 0.3 12/24/2020 1539   BASOSABS 0.1 12/24/2020 1539      Latest Ref Rng & Units 05/15/2022    3:34 PM 04/17/2022   10:50 AM 02/15/2022   11:41 AM  CMP  Glucose 70 - 99 mg/dL 161  90  096   BUN 8 - 27 mg/dL Creatinine 0.57 - 1.00 mg/dL 0.45  4.09  8.11   Sodium 134 - 144 mmol/L 136  138  137   Potassium 3.5 - 5.2 mmol/L 4.6  5.1  4.3   Chloride 96 - 106 mmol/L 95  100  98   CO2 20 - 29 mmol/L Calcium 8.7 - 10.3 mg/dL 9.0  9.1  9.2   Total Protein 6.0 -  8.5 g/dL 7.0     Total Bilirubin 0.0 - 1.2 mg/dL 0.2     Alkaline Phos 44 - 121 IU/L 134     AST 0 - 40 IU/L 28     ALT 0 - 32 IU/L 19       DIAGNOSTIC IMAGING:  I have independently reviewed the relevant imaging and discussed with the patient.   WRAP UP:  All questions were answered. The patient knows to call the clinic with any problems, questions or concerns.  Medical decision making: Moderate  Time spent on visit: I spent 20 minutes counseling the patient face to face. The total time spent in the appointment was 30 minutes and more than 50% was on counseling.  Carnella Guadalajara, PA-C  07/26/22 10:06 AM

## 2022-07-26 NOTE — Patient Instructions (Signed)
[facility] **VISIT SUMMARY & IMPORTANT INSTRUCTIONS **   You were seen today by Rojelio Brenner PA-C for your follow-up visit.    IRON DEFICIENCY ANEMIA Your blood and iron levels look much better! You do not need any IV iron at this time. Will check your labs again in 3 months.  OTHER NUTRITIONAL DEFICIENCIES Your B12, copper, and folic acid levels look great! Continue to take multivitamin daily. Your vitamin D was slightly low.  You should start taking over-the-counter vitamin D3 25 mcg (1000 units) daily.  FOLLOW-UP APPOINTMENT: Labs and follow-up visit in 3 months  ** Thank you for trusting me with your healthcare!  I strive to provide all of my patients with quality care at each visit.  If you receive a survey for this visit, I would be so grateful to you for taking the time to provide feedback.  Thank you in advance!  ~ Ahren Pettinger                   Dr. Doreatha Massed   &   Rojelio Brenner, PA-C   - - - - - - - - - - - - - - - - - -    Thank you for choosing [facility] at [facility] to provide your oncology and hematology care.  To afford each patient quality time with our provider, please arrive at least 15 minutes before your scheduled appointment time.   If you have a lab appointment with the Cancer Center please come in thru the Main Entrance and check in at the main information desk.  You need to re-schedule your appointment should you arrive 10 or more minutes late.  We strive to give you quality time with our providers, and arriving late affects you and other patients whose appointments are after yours.  Also, if you no show three or more times for appointments you may be dismissed from the clinic at the providers discretion.     Again, thank you for choosing [facility].  Our hope is that these requests will decrease the amount of time that you wait before being seen by our physicians.        _____________________________________________________________  Should you have questions after your visit to [facility], please contact our office at [phone] and follow the prompts.  Our office hours are 8:00 a.m. and 4:30 p.m. Monday - Friday.  Please note that voicemails left after 4:00 p.m. may not be returned until the following business day.  We are closed weekends and major holidays.  You do have access to a nurse 24-7, just call the main number to the clinic [phone] and do not press any options, hold on the line and a nurse will answer the phone.    For prescription refill requests, have your pharmacy contact our office and allow 72 hours.

## 2022-08-01 ENCOUNTER — Ambulatory Visit (HOSPITAL_COMMUNITY)
Admission: RE | Admit: 2022-08-01 | Discharge: 2022-08-01 | Disposition: A | Discharge status: Home / Self Care | Payer: 59 | Source: Ambulatory Visit | Attending: Nurse Practitioner | Admitting: Nurse Practitioner

## 2022-08-01 ENCOUNTER — Encounter (HOSPITAL_COMMUNITY): Payer: Self-pay

## 2022-08-01 DIAGNOSIS — N6341 Unspecified lump in right breast, subareolar: Secondary | ICD-10-CM | POA: Diagnosis not present

## 2022-08-01 DIAGNOSIS — N6489 Other specified disorders of breast: Secondary | ICD-10-CM | POA: Diagnosis not present

## 2022-08-01 DIAGNOSIS — R92311 Mammographic fatty tissue density, right breast: Secondary | ICD-10-CM | POA: Diagnosis not present

## 2022-08-08 ENCOUNTER — Ambulatory Visit (INDEPENDENT_AMBULATORY_CARE_PROVIDER_SITE_OTHER): Payer: 59

## 2022-08-08 DIAGNOSIS — I428 Other cardiomyopathies: Secondary | ICD-10-CM | POA: Diagnosis not present

## 2022-08-08 LAB — CUP PACEART REMOTE DEVICE CHECK
Battery Remaining Longevity: 102 mo
Battery Voltage: 3 V
Brady Statistic AP VP Percent: 42.02 %
Brady Statistic AP VS Percent: 0 %
Brady Statistic AS VP Percent: 57.97 %
Brady Statistic AS VS Percent: 0.01 %
Brady Statistic RA Percent Paced: 42.67 %
Brady Statistic RV Percent Paced: 99.98 %
Date Time Interrogation Session: 20240430101003
Implantable Lead Connection Status: 753985
Implantable Lead Connection Status: 753985
Implantable Lead Connection Status: 753985
Implantable Lead Implant Date: 20100201
Implantable Lead Implant Date: 20100201
Implantable Lead Implant Date: 20220706
Implantable Lead Location: 753858
Implantable Lead Location: 753859
Implantable Lead Location: 753860
Implantable Lead Model: 4598
Implantable Lead Model: 5076
Implantable Lead Model: 5076
Implantable Pulse Generator Implant Date: 20220706
Lead Channel Impedance Value: 380 Ohm
Lead Channel Impedance Value: 380 Ohm
Lead Channel Impedance Value: 437 Ohm
Lead Channel Impedance Value: 475 Ohm
Lead Channel Impedance Value: 475 Ohm
Lead Channel Impedance Value: 494 Ohm
Lead Channel Impedance Value: 494 Ohm
Lead Channel Impedance Value: 494 Ohm
Lead Channel Impedance Value: 551 Ohm
Lead Channel Impedance Value: 798 Ohm
Lead Channel Impedance Value: 817 Ohm
Lead Channel Impedance Value: 836 Ohm
Lead Channel Impedance Value: 855 Ohm
Lead Channel Impedance Value: 855 Ohm
Lead Channel Pacing Threshold Amplitude: 1.125 V
Lead Channel Pacing Threshold Pulse Width: 0.4 ms
Lead Channel Sensing Intrinsic Amplitude: 0.75 mV
Lead Channel Sensing Intrinsic Amplitude: 0.75 mV
Lead Channel Sensing Intrinsic Amplitude: 3.625 mV
Lead Channel Setting Pacing Amplitude: 1.5 V
Lead Channel Setting Pacing Amplitude: 2 V
Lead Channel Setting Pacing Amplitude: 2.25 V
Lead Channel Setting Pacing Pulse Width: 0.4 ms
Lead Channel Setting Pacing Pulse Width: 0.8 ms
Lead Channel Setting Sensing Sensitivity: 0.9 mV
Zone Setting Status: 755011

## 2022-08-09 ENCOUNTER — Encounter: Payer: Self-pay | Admitting: Nurse Practitioner

## 2022-08-14 ENCOUNTER — Ambulatory Visit (INDEPENDENT_AMBULATORY_CARE_PROVIDER_SITE_OTHER): Payer: 59 | Admitting: Family Medicine

## 2022-08-14 VITALS — BP 118/76 | HR 72 | Temp 97.3°F | Ht 65.0 in | Wt 251.0 lb

## 2022-08-14 DIAGNOSIS — M2550 Pain in unspecified joint: Secondary | ICD-10-CM

## 2022-08-14 MED ORDER — DULOXETINE HCL 60 MG PO CPEP: 60.0000 mg | ORAL_CAPSULE | Freq: Every day | ORAL | 1 refills | Status: DC

## 2022-08-14 NOTE — Progress Notes (Signed)
Subjective:  Patient ID: [name], female    DOB: 05-03-1960  Age: 62 y.o. MRN: [id number]  CC: Chief Complaint  Patient presents with   Pain    Knees, hips, legs 3 month follow up    HPI:  62 year old female with morbid obesity, nonischemic cardiomyopathy, atrial fibrillation, history of AV block with current pacemaker, iron deficiency anemia, history of bariatric surgery presents for follow-up regarding pain.  Patient continues to endorse pain of the lateral hips, bilateral knees, lower extremities and feet.  She has had prior imaging of the hips and pelvis as well as the knees which showed mild arthritis.  Patient states that her pain is severe.  It is interfering with her normal activity.  She is currently on Cymbalta and tramadol.  Cannot take NSAIDs due to cardiac history as well as bariatric surgery.  Patient would like to discuss treatment options regarding her pain.  Patient Active Problem List   Diagnosis Date Noted   Polyarthralgia 07/04/2022   Morbid obesity (HCC) 05/16/2022   Chronic pain of both knees 05/16/2022   Chronic pain of both hips 05/16/2022   History of complete AV block 05/15/2022   Atrial fibrillation (HCC) 05/15/2022   Chronic combined systolic and diastolic CHF (congestive heart failure) (HCC) 05/15/2022   Iron deficiency anemia    Atrial tachycardia 04/26/2019   NICM (nonischemic cardiomyopathy) (HCC) 04/26/2019   PACEMAKER,MDT 03/02/2009    Social Hx   Social History   Socioeconomic History   Marital status: Married    Spouse name: Not on file   Number of children: Not on file   Years of education: Not on file   Highest education level: Bachelor's degree (e.g., BA, AB, BS)  Occupational History   Not on file  Tobacco Use   Smoking status: Never   Smokeless tobacco: Never  Vaping Use   Vaping Use: Never used  Substance and Sexual Activity   Alcohol use: Not Currently    Alcohol/week: 7.0 standard drinks of alcohol    Types: 7  Cans of beer per week   Drug use: No   Sexual activity: Not on file  Other Topics Concern   Not on file  Social History Narrative   She is retired from working as an Charity fundraiser at [employer], 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   Social Determinants of Health   Financial Resource Strain: Low Risk  (07/14/2022)   Overall Financial Resource Strain (CARDIA)    Difficulty of Paying Living Expenses: Not hard at all  Food Insecurity: No Food Insecurity (07/14/2022)   Hunger Vital Sign    Worried About Running Out of Food in the Last Year: Never true    Ran Out of Food in the Last Year: Never true  Transportation Needs: No Transportation Needs (07/14/2022)   PRAPARE - Administrator, Civil Service (Medical): No    Lack of Transportation (Non-Medical): No  Physical Activity: Unknown (07/14/2022)   Exercise Vital Sign    Days of Exercise per Week: 0 days    Minutes of Exercise per Session: Not on file  Stress: No Stress Concern Present (07/14/2022)   [employer] of Occupational Health - Occupational Stress Questionnaire    Feeling of Stress : Not at all  Social Connections: Socially Integrated (07/14/2022)   Social Connection and Isolation Panel [NHANES]    Frequency of Communication with Friends and Family: More than three times a week    Frequency of  Social Gatherings with Friends and Family: Once a week    Attends Religious Services: More than 4 times per year    Active Member of [employer] or Organizations: Yes    Attends Engineer, structural: More than 4 times per year    Marital Status: Married    Review of Systems Per HPI  Objective:  BP 118/76   Pulse 72   Temp (!) 97.3 F (36.3 C)   Ht 5\' 5"  (1.651 m)   Wt 251 lb (113.9 kg)   SpO2 96%   BMI 41.77 kg/m      08/14/2022    2:13 PM 07/26/2022    9:35 AM 07/14/2022    3:21 PM  BP/Weight  Systolic BP 118 104 130  Diastolic BP 76 79 80  Wt. (Lbs) 251 254.63 256  BMI 41.77 kg/m2 42.37 kg/m2 42.6  kg/m2    Physical Exam Constitutional:      General: She is not in acute distress.    Appearance: Normal appearance. She is obese.  HENT:     Head: Normocephalic and atraumatic.  Pulmonary:     Effort: Pulmonary effort is normal. No respiratory distress.  Musculoskeletal:     Comments: Tenderness to the medial aspect of the left knee.  Neurological:     Mental Status: She is alert.  Psychiatric:        Mood and Affect: Mood normal.        Behavior: Behavior normal.     Lab Results  Component Value Date   WBC 6.5 05/15/2022   HGB 10.3 (L) 05/15/2022   HCT 34.5 05/15/2022   PLT 312 05/15/2022   GLUCOSE 104 (H) 05/15/2022   CHOL 204 (H) 05/15/2022   TRIG 106 05/15/2022   HDL 71 05/15/2022   LDLCALC 114 (H) 05/15/2022   ALT 19 05/15/2022   AST 28 05/15/2022   NA 136 05/15/2022   K 4.6 05/15/2022   CL 95 (L) 05/15/2022   CREATININE 1.11 (H) 05/15/2022   BUN 18 05/15/2022   CO2 25 05/15/2022   INR 1.1 RATIO (H) 05/05/2008   HGBA1C 5.8 (H) 05/15/2022     Assessment & Plan:   Problem List Items Addressed This Visit       Other   Polyarthralgia - Primary    Normal sed rate.  ANA negative.  Anti-CCP negative.  Rheumatoid factor negative. Etiology of her pain is unclear.  She does have some mild degenerative changes but this seems out of proportion to the amount of pain that she is in.  Increasing Cymbalta.  We discussed physical therapy as well.  We also discussed adding Lyrica.  I also discussed referring to pain management.  We will see how she does over the next 2 weeks with an increase in Lyrica.  We will then go from there.  X-ray of lumbar spine today      Relevant Orders   DG Lumbar Spine Complete    Meds ordered this encounter  Medications   DULoxetine (CYMBALTA) 60 MG capsule    Sig: Take 1 capsule (60 mg total) by mouth daily.    Dispense:  90 capsule    Refill:  1    Follow-up: Patient to follow-up with me via message in 2 weeks  Dinia Joynt Adriana Simas  DO [facility]

## 2022-08-14 NOTE — Assessment & Plan Note (Signed)
Normal sed rate.  ANA negative.  Anti-CCP negative.  Rheumatoid factor negative. Etiology of her pain is unclear.  She does have some mild degenerative changes but this seems out of proportion to the amount of pain that she is in.  Increasing Cymbalta.  We discussed physical therapy as well.  We also discussed adding Lyrica.  I also discussed referring to pain management.  We will see how she does over the next 2 weeks with an increase in Lyrica.  We will then go from there.  X-ray of lumbar spine today

## 2022-08-14 NOTE — Patient Instructions (Addendum)
I increased the Cymbalta.   Consider PT. Consider Lyrica.   Message me in 2 weeks and let me know how things are going.  Take care  Dr. Adriana Simas

## 2022-08-23 ENCOUNTER — Ambulatory Visit (HOSPITAL_COMMUNITY)
Admission: RE | Admit: 2022-08-23 | Discharge: 2022-08-23 | Disposition: A | Discharge status: Home / Self Care | Payer: 59 | Source: Ambulatory Visit | Attending: Family Medicine | Admitting: Family Medicine

## 2022-08-23 DIAGNOSIS — M2550 Pain in unspecified joint: Secondary | ICD-10-CM | POA: Diagnosis not present

## 2022-08-23 DIAGNOSIS — M545 Low back pain, unspecified: Secondary | ICD-10-CM | POA: Diagnosis not present

## 2022-08-30 NOTE — Progress Notes (Signed)
Remote pacemaker transmission.   

## 2022-09-08 ENCOUNTER — Encounter: Payer: Self-pay | Admitting: Family Medicine

## 2022-09-08 ENCOUNTER — Other Ambulatory Visit: Payer: Self-pay | Admitting: Family Medicine

## 2022-09-08 MED ORDER — TRAMADOL HCL 50 MG PO TABS: 50.0000 mg | ORAL_TABLET | Freq: Three times a day (TID) | ORAL | 3 refills | Status: DC | PRN

## 2022-09-29 ENCOUNTER — Telehealth: Payer: Self-pay | Admitting: Cardiology

## 2022-09-29 NOTE — Telephone Encounter (Signed)
Pt c/o Shortness Of Breath: STAT if SOB developed within the last 24 hours or pt is noticeably SOB on the phone  1. Are you currently SOB (can you hear that pt is SOB on the phone)? No   2. How long have you been experiencing SOB? 4 days   3. Are you SOB when sitting or when up moving around? Moving around   4. Are you currently experiencing any other symptoms? Pt states "I just feel weak and lightheaded."

## 2022-09-29 NOTE — Telephone Encounter (Signed)
Spoke to patient who stated that she has been experiencing SOB, light headedness, and weakness for the last 3-4 days with very little exertion. Pt stated that on Tuesday she took an extra Torsemide tablet hoping it would help but has not. Pt verified that current dosing for Torsemide is 60 mg pq every day- decreased by Dr. Graciela Husbands d/t kidney function.  Pt stated that she took medication a little over 2 hours ago and has not urinated since.   Please advise.

## 2022-10-02 ENCOUNTER — Encounter: Payer: Self-pay | Admitting: Family Medicine

## 2022-10-02 NOTE — Telephone Encounter (Signed)
There is a 9AM opening in [place] tomorrow with me if she is able to make it to sort this out  Dominga Ferry MD

## 2022-10-02 NOTE — Telephone Encounter (Signed)
Patient stated she is feeling significantly better and would like to keep her July appointment.

## 2022-10-03 ENCOUNTER — Other Ambulatory Visit: Payer: Self-pay | Admitting: Family Medicine

## 2022-10-03 MED ORDER — SPIRONOLACTONE 25 MG PO TABS: ORAL_TABLET | ORAL | 3 refills | Status: DC

## 2022-10-17 ENCOUNTER — Encounter: Payer: Self-pay | Admitting: Family Medicine

## 2022-10-18 ENCOUNTER — Other Ambulatory Visit: Payer: Self-pay | Admitting: *Deleted

## 2022-10-18 ENCOUNTER — Encounter: Payer: Self-pay | Admitting: Cardiology

## 2022-10-18 MED ORDER — TORSEMIDE 20 MG PO TABS: 60.0000 mg | ORAL_TABLET | Freq: Every day | ORAL | 3 refills | Status: DC

## 2022-10-19 ENCOUNTER — Other Ambulatory Visit: Payer: Self-pay

## 2022-10-19 DIAGNOSIS — I5042 Chronic combined systolic (congestive) and diastolic (congestive) heart failure: Secondary | ICD-10-CM

## 2022-10-19 DIAGNOSIS — I428 Other cardiomyopathies: Secondary | ICD-10-CM

## 2022-10-19 DIAGNOSIS — I4719 Other supraventricular tachycardia: Secondary | ICD-10-CM

## 2022-10-25 ENCOUNTER — Other Ambulatory Visit (HOSPITAL_COMMUNITY): Payer: Self-pay | Admitting: Physician Assistant

## 2022-10-25 DIAGNOSIS — E559 Vitamin D deficiency, unspecified: Secondary | ICD-10-CM | POA: Diagnosis not present

## 2022-10-25 DIAGNOSIS — I5042 Chronic combined systolic (congestive) and diastolic (congestive) heart failure: Secondary | ICD-10-CM | POA: Diagnosis not present

## 2022-10-25 DIAGNOSIS — I428 Other cardiomyopathies: Secondary | ICD-10-CM | POA: Diagnosis not present

## 2022-10-25 DIAGNOSIS — I4719 Other supraventricular tachycardia: Secondary | ICD-10-CM | POA: Diagnosis not present

## 2022-10-25 DIAGNOSIS — D508 Other iron deficiency anemias: Secondary | ICD-10-CM | POA: Diagnosis not present

## 2022-10-26 LAB — BASIC METABOLIC PANEL
BUN/Creatinine Ratio: 14 (ref 12–28)
BUN: 13 mg/dL (ref 8–27)
CO2: 29 mmol/L (ref 20–29)
Calcium: 9.4 mg/dL (ref 8.7–10.3)
Chloride: 97 mmol/L (ref 96–106)
Creatinine, Ser: 0.92 mg/dL (ref 0.57–1.00)
Glucose: 88 mg/dL (ref 70–99)
Potassium: 4.4 mmol/L (ref 3.5–5.2)
Sodium: 141 mmol/L (ref 134–144)
eGFR: 71 mL/min/{1.73_m2} (ref >59)

## 2022-10-26 LAB — IRON AND TIBC
Iron Saturation: 36 % (ref 15–55)
Iron: 127 ug/dL (ref 27–139)
Total Iron Binding Capacity: 353 ug/dL (ref 250–450)
UIBC: 226 ug/dL (ref 118–369)

## 2022-10-26 LAB — CBC WITH DIFFERENTIAL/PLATELET
Basophils Absolute: 0.1 10*3/uL (ref 0.0–0.2)
Basos: 1 %
EOS (ABSOLUTE): 0.2 10*3/uL (ref 0.0–0.4)
Eos: 3 %
Hematocrit: 45 % (ref 34.0–46.6)
Hemoglobin: 15 g/dL (ref 11.1–15.9)
Immature Grans (Abs): 0 10*3/uL (ref 0.0–0.1)
Immature Granulocytes: 0 %
Lymphocytes Absolute: 1.8 10*3/uL (ref 0.7–3.1)
Lymphs: 35 %
MCH: 31.6 pg (ref 26.6–33.0)
MCHC: 33.3 g/dL (ref 31.5–35.7)
MCV: 95 fL (ref 79–97)
Monocytes Absolute: 0.5 10*3/uL (ref 0.1–0.9)
Monocytes: 9 %
Neutrophils Absolute: 2.7 10*3/uL (ref 1.4–7.0)
Neutrophils: 52 %
Platelets: 194 10*3/uL (ref 150–450)
RBC: 4.74 x10E6/uL (ref 3.77–5.28)
RDW: 13 % (ref 11.7–15.4)
WBC: 5.2 10*3/uL (ref 3.4–10.8)

## 2022-10-26 LAB — FERRITIN: Ferritin: 80 ng/mL (ref 15–150)

## 2022-10-26 LAB — SPECIMEN STATUS REPORT

## 2022-10-26 LAB — VITAMIN D 25 HYDROXY (VIT D DEFICIENCY, FRACTURES): Vit D, 25-Hydroxy: 29.8 ng/mL — ABNORMAL LOW (ref 30.0–100.0)

## 2022-10-27 ENCOUNTER — Ambulatory Visit: Payer: 59 | Attending: Nurse Practitioner | Admitting: Nurse Practitioner

## 2022-10-27 VITALS — BP 98/78 | HR 85 | Ht 65.0 in | Wt 248.2 lb

## 2022-10-27 DIAGNOSIS — Z950 Presence of cardiac pacemaker: Secondary | ICD-10-CM

## 2022-10-27 DIAGNOSIS — I340 Nonrheumatic mitral (valve) insufficiency: Secondary | ICD-10-CM | POA: Diagnosis not present

## 2022-10-27 DIAGNOSIS — I4719 Other supraventricular tachycardia: Secondary | ICD-10-CM

## 2022-10-27 DIAGNOSIS — E785 Hyperlipidemia, unspecified: Secondary | ICD-10-CM | POA: Diagnosis not present

## 2022-10-27 DIAGNOSIS — I5032 Chronic diastolic (congestive) heart failure: Secondary | ICD-10-CM

## 2022-10-27 DIAGNOSIS — I480 Paroxysmal atrial fibrillation: Secondary | ICD-10-CM

## 2022-10-27 DIAGNOSIS — Z87898 Personal history of other specified conditions: Secondary | ICD-10-CM

## 2022-10-27 DIAGNOSIS — Z8679 Personal history of other diseases of the circulatory system: Secondary | ICD-10-CM | POA: Diagnosis not present

## 2022-10-27 DIAGNOSIS — I428 Other cardiomyopathies: Secondary | ICD-10-CM | POA: Diagnosis not present

## 2022-10-27 NOTE — Patient Instructions (Signed)
Medication Instructions:  Your physician recommends that you continue on your current medications as directed. Please refer to the Current Medication list given to you today.   Labwork: Lipid LFT 1-2 weeks fasting to be done at [facility]  Testing/Procedures: None  Follow-Up: Your physician recommends that you schedule a follow-up appointment in: 1 year. You will receive a reminder call in about 8 months reminding you to schedule your appointment. If you don't receive this call, please contact our office.   Any Other Special Instructions Will Be Listed Below (If Applicable).  If you need a refill on your cardiac medications before your next appointment, please call your pharmacy.

## 2022-10-27 NOTE — Progress Notes (Unsigned)
Cardiology Office Note:  .   Date:  10/27/2022 ID:  [name] [name], DOB Nov 08, 1960, MRN [id number] PCP: [name], DO  [facility] Providers Cardiologist:  [name], MD Electrophysiologist:  Sherryl Manges, MD {  History of Present Illness: .   [name] [name] is a 62 y.o. female with a PMH of HFpEF, tachycardia induced CM/HFpEF, PAF, hx of syncope, CHB, s/p AV junction ablation, hx of PPM, MR, and anemia who presents today for 1 year follow-up.   Last seen by Dr. [name] 1 year ago. Was doing well at the time.   Today she presents for 1 year follow-up. States in June 2024 she called into office noting shortness of breath and stated she noted palpitations. Took an extra tablet of 20 mg of Torsemide for 3-4 days and symptoms were resolved. Says she has been doing well recently. Denies any chest pain, shortness of breath, palpitations, syncope, presyncope, dizziness, orthopnea, PND, swelling or significant weight changes, acute bleeding, or claudication. Has lost around 50 lbs. Has noticed her SBP's have been in upper 90's recently, asymptomatic with these readings.   Studies Reviewed: Marland Kitchen       EKG today reveals overall V paced rhythm, 63 bpm.   Echo 03/2021:  1. Left ventricular ejection fraction, by estimation, is 50 to 55%. The  left ventricle has low normal function. The left ventricle has no regional  wall motion abnormalities. Left ventricular diastolic parameters were  normal.   2. Right ventricular systolic function is normal. The right ventricular  size is normal. There is mildly elevated pulmonary artery systolic  pressure.   3. The mitral valve is abnormal. Mild mitral valve regurgitation. No  evidence of mitral stenosis.   4. The aortic valve is tricuspid. Aortic valve regurgitation is not  visualized. No aortic stenosis is present.   5. The inferior vena cava is normal in size with greater than 50%  respiratory variability, suggesting right  atrial pressure of 3 mmHg.   Comparison(s): Previous Echo showed LVEF 40-45%, diffuse HK, mild MR with  posterior jet.  Physical Exam:   VS:  BP 98/78   Pulse 85   Ht 5\' 5"  (1.651 m)   Wt 248 lb 3.2 oz (112.6 kg)   SpO2 93%   BMI 41.30 kg/m    Wt Readings from Last 3 Encounters:  10/27/22 248 lb 3.2 oz (112.6 kg)  08/14/22 251 lb (113.9 kg)  07/26/22 254 lb 10.1 oz (115.5 kg)    GEN: Morbidly obese, 62 y.o. female in no acute distress NECK: No JVD; No carotid bruits CARDIAC: S1/S2, RRR, no murmurs, rubs, gallops RESPIRATORY:  Clear to auscultation without rales, wheezing or rhonchi  ABDOMEN: Soft, non-tender, non-distended EXTREMITIES:  No edema; No deformity   ASSESSMENT AND PLAN: .    PAF/ Atrial tachycardia/ CHB, s/p AV junction ablation, s/p PPM (CRT [employer]) Denies any recent tachycardia or palpitations. HR well controlled today. Continue Coreg. Due to her CHA2DS2-VASc score of 1, she is no longer on anticoagulation. SBP 98 today, asymptomatic. Patient declines medication adjustments at this time.  Normal device function seen on remote device check 08/2022.  Continue follow-up with Dr. Graciela Husbands (Electrophysiologist).   HFpEF, NICM Stage C, NYHA class I symptoms. EF 50-55%. Euvolemic and well compensated on exam. SBP 90's recently. Pt asymptomatic and declines medication adjustments at this time. Continue Coreg, Jardiance, Entresto, Aldactone, and Torsemide. Low sodium diet, fluid restriction <2L, and daily weights encouraged. Educated to contact our  office for weight gain of 2 lbs overnight or 5 lbs in one week.  Hx of syncope Denies any recent episodes. No medication changes at this time as patient declines medication changes at this time. Discussed to monitor BP at home at least 2 hours after medications and sitting for 5-10 minutes and to notify office if SBP drops or she develops new symptoms. Care and ED precautions discussed.  MR Mild mitral valve regurgitation noted on  TTE 03/2021. This improved from previous study. Plan to update Echo next year (03/2024) to monitor or sooner if clinically indicated.   5. Morbid obesity Has lost 50 lbs recently. I congratulated her. Weight loss via diet and exercise encouraged. Discussed the impact being overweight would have on cardiovascular risk.   6. Hyperlipidemia Was started on atorvastatin by PCP. She is due for repeat labs. Will draw FLP and LFT in 1-2 weeks.   Dispo: Follow-up in 1 year with Dr. [name] or APP or sooner if anything changes.   Signed, Sharlene Dory, NP

## 2022-11-01 NOTE — Progress Notes (Signed)
VIRTUAL VISIT via TELEPHONE NOTE [facility]   I connected with [name] [name]  on 11/03/22 at  2:29 PM by telephone and verified that I am speaking with the correct person using two identifiers.  Location: Patient: Home Provider: [facility]   I discussed the limitations, risks, security and privacy concerns of performing an evaluation and management service by telephone and the availability of in person appointments. I also discussed with the patient that there may be a patient responsible charge related to this service. The patient expressed understanding and agreed to proceed.  REASON FOR VISIT:  Follow-up for iron deficiency anemia   CURRENT THERAPY: IV iron   INTERVAL HISTORY:   [name] 62 y.o. female returns for routine follow-up of severe iron deficiency anemia.  She was last seen by Rojelio Brenner PA-C on 07/26/2022.  She received Venofer 500 mg x 2 doses on 05/23/2022 and 05/30/2022.  Overall, she reports that she is feeling fairly well.  She reports chronic fatigue which is stable at baseline.  She denies any pica, restless legs, headaches, chest pain, dyspnea, lightheadedness, or syncope.  She has not noticed any rectal bleeding, melena, epistaxis, or hematemesis.  She has been taking Vitamin D 1000 units daily since last visit.  She has 50% energy and 100% appetite. She endorses that she is maintaining a stable weight.  REVIEW OF SYSTEMS:   Review of Systems  Constitutional:  Positive for malaise/fatigue. Negative for chills, diaphoresis, fever and weight loss.  Respiratory:  Negative for cough and shortness of breath.   Cardiovascular:  Positive for chest pain (at times) and palpitations.  Gastrointestinal:  Negative for abdominal pain, blood in stool, melena, nausea and vomiting.  Neurological:  Negative for dizziness and headaches.     PHYSICAL EXAM: (per limitations of virtual telephone visit)  The patient is alert and oriented x 3,  exhibiting adequate mentation, good mood, and ability to speak in full sentences and execute sound judgement.  ASSESSMENT & PLAN:   Severe iron deficiency anemia: - Referred by PCP (Dr. Everlene Other) for evaluation and treatment of iron deficiency anemia after labs from 05/15/2022 showed ferritin 14, iron saturation 5%, hemoglobin 10.3/MCV 74 - She had gastric bypass surgery in 2005 - Unable to tolerate iron pill due to constipation.  Required previous IV iron infusions at [facility] / [facility]. - No history of blood transfusions. - EGD (12/28/2020): Overall normal with widely patent gastrojejunal anastomosis - Colonoscopy (12/28/2020): Limited due to poor prep, but no gross lesions noted - Most recent IV iron with Venofer 500 mg x 2 doses on 05/23/2022 and 05/30/2022 - Denies any rectal bleeding or melena  - Most recent labs (10/25/2022): Hgb 15.0/MCV 95.  Iron saturation 36%, ferritin 80 - DIFFERENTIAL DIAGNOSIS favors iron deficiency anemia secondary to malabsorption s/p gastric bypass surgery - PLAN: No indication for IV iron at this time.  Will recheck CBC and iron panel in 6 months.     2.  Nutritional deficiencies secondary to history gastric bypass surgery - Additional panel (07/20/2022): Normal folate >20 Normal copper 127 Normal B12 574, MMA elevated 585 Vitamin D low at 25.9 - She takes multivitamin at home daily.  Taking vitamin D 1000 units daily since April 2024. - Most recent labs (10/25/2022): Vitamin D improved at 29.8.   - PLAN: Recommend increasing OTC vitamin D3 to 2000 units daily.  Will recheck vitamin D levels at follow-up in 6 months.   3.  Social/family history: - PMH includes congestive heart failure and cardiomyopathy - Independent of ADLs and IADLs.  She is a retired Charity fundraiser, worked at [employer]. - Lifelong non-smoker. - No family history of anemia. - Sister died of breast cancer.  Another sister had glioblastoma.  Brother had multiple myeloma.  Another sister had  breast cancer.   PLAN SUMMARY:  >> Labs in 6 months via [facility] = CBC/D, ferritin, iron/TIBC, vitamin D, B12, MMA >> PHONE visit in 6 months (1 to 2 weeks after labs)  **Last office visit 07/26/2022     I discussed the assessment and treatment plan with the patient. The patient was provided an opportunity to ask questions and all were answered. The patient agreed with the plan and demonstrated an understanding of the instructions.   The patient was advised to call back or seek an in-person evaluation if the symptoms worsen or if the condition fails to improve as anticipated.  I provided 13 minutes of non-face-to-face time during this encounter.  Carnella Guadalajara, PA-C 11/03/22 2:41 PM

## 2022-11-03 ENCOUNTER — Inpatient Hospital Stay: Payer: 59 | Attending: Hematology | Admitting: Physician Assistant

## 2022-11-03 DIAGNOSIS — E559 Vitamin D deficiency, unspecified: Secondary | ICD-10-CM

## 2022-11-03 DIAGNOSIS — Z808 Family history of malignant neoplasm of other organs or systems: Secondary | ICD-10-CM | POA: Diagnosis not present

## 2022-11-03 DIAGNOSIS — E639 Nutritional deficiency, unspecified: Secondary | ICD-10-CM | POA: Diagnosis not present

## 2022-11-03 DIAGNOSIS — Z9884 Bariatric surgery status: Secondary | ICD-10-CM | POA: Diagnosis not present

## 2022-11-03 DIAGNOSIS — D508 Other iron deficiency anemias: Secondary | ICD-10-CM

## 2022-11-03 DIAGNOSIS — Z807 Family history of other malignant neoplasms of lymphoid, hematopoietic and related tissues: Secondary | ICD-10-CM

## 2022-11-03 DIAGNOSIS — Z803 Family history of malignant neoplasm of breast: Secondary | ICD-10-CM

## 2022-11-03 NOTE — Addendum Note (Signed)
Addended by: Rojelio Brenner on: 11/03/2022 02:45 PM   Modules accepted: Orders

## 2022-11-07 ENCOUNTER — Ambulatory Visit (INDEPENDENT_AMBULATORY_CARE_PROVIDER_SITE_OTHER): Payer: 59

## 2022-11-07 DIAGNOSIS — I480 Paroxysmal atrial fibrillation: Secondary | ICD-10-CM

## 2022-11-07 DIAGNOSIS — E785 Hyperlipidemia, unspecified: Secondary | ICD-10-CM | POA: Diagnosis not present

## 2022-11-07 DIAGNOSIS — I442 Atrioventricular block, complete: Secondary | ICD-10-CM

## 2022-11-07 LAB — CUP PACEART REMOTE DEVICE CHECK
Battery Remaining Longevity: 89 mo
Battery Voltage: 3 V
Brady Statistic AP VP Percent: 44.35 %
Brady Statistic AP VS Percent: 0 %
Brady Statistic AS VP Percent: 55.62 %
Brady Statistic AS VS Percent: 0.03 %
Brady Statistic RA Percent Paced: 46.32 %
Brady Statistic RV Percent Paced: 99.96 %
Date Time Interrogation Session: 20240730112246
Implantable Lead Connection Status: 753985
Implantable Lead Connection Status: 753985
Implantable Lead Connection Status: 753985
Implantable Lead Implant Date: 20100201
Implantable Lead Implant Date: 20100201
Implantable Lead Implant Date: 20220706
Implantable Lead Location: 753858
Implantable Lead Location: 753859
Implantable Lead Location: 753860
Implantable Lead Model: 4598
Implantable Lead Model: 5076
Implantable Lead Model: 5076
Implantable Pulse Generator Implant Date: 20220706
Lead Channel Impedance Value: 361 Ohm
Lead Channel Impedance Value: 380 Ohm
Lead Channel Impedance Value: 418 Ohm
Lead Channel Impedance Value: 456 Ohm
Lead Channel Impedance Value: 456 Ohm
Lead Channel Impedance Value: 475 Ohm
Lead Channel Impedance Value: 475 Ohm
Lead Channel Impedance Value: 494 Ohm
Lead Channel Impedance Value: 513 Ohm
Lead Channel Impedance Value: 741 Ohm
Lead Channel Impedance Value: 779 Ohm
Lead Channel Impedance Value: 779 Ohm
Lead Channel Impedance Value: 798 Ohm
Lead Channel Impedance Value: 798 Ohm
Lead Channel Pacing Threshold Amplitude: 1.125 V
Lead Channel Pacing Threshold Pulse Width: 0.4 ms
Lead Channel Sensing Intrinsic Amplitude: 1.125 mV
Lead Channel Sensing Intrinsic Amplitude: 1.125 mV
Lead Channel Sensing Intrinsic Amplitude: 3.625 mV
Lead Channel Setting Pacing Amplitude: 1.5 V
Lead Channel Setting Pacing Amplitude: 2 V
Lead Channel Setting Pacing Amplitude: 2.75 V
Lead Channel Setting Pacing Pulse Width: 0.4 ms
Lead Channel Setting Pacing Pulse Width: 0.8 ms
Lead Channel Setting Sensing Sensitivity: 0.9 mV
Zone Setting Status: 755011

## 2022-11-29 NOTE — Progress Notes (Signed)
Remote pacemaker transmission.   

## 2023-01-10 ENCOUNTER — Other Ambulatory Visit: Payer: Self-pay | Admitting: Family Medicine

## 2023-01-18 ENCOUNTER — Encounter: Payer: Self-pay | Admitting: Internal Medicine

## 2023-01-22 ENCOUNTER — Other Ambulatory Visit: Payer: Self-pay | Admitting: Family Medicine

## 2023-01-29 ENCOUNTER — Encounter: Payer: Self-pay | Admitting: Hematology

## 2023-01-29 ENCOUNTER — Other Ambulatory Visit (HOSPITAL_COMMUNITY): Payer: Self-pay

## 2023-01-29 MED ORDER — ENTRESTO 24-26 MG PO TABS: 1.0000 | ORAL_TABLET | Freq: Two times a day (BID) | ORAL | 3 refills | Status: DC

## 2023-01-29 NOTE — Telephone Encounter (Signed)
Per test claim patient's cost for [facility] for 1 month is $20.

## 2023-02-06 ENCOUNTER — Ambulatory Visit (INDEPENDENT_AMBULATORY_CARE_PROVIDER_SITE_OTHER): Payer: 59

## 2023-02-06 DIAGNOSIS — I442 Atrioventricular block, complete: Secondary | ICD-10-CM

## 2023-02-08 ENCOUNTER — Ambulatory Visit (INDEPENDENT_AMBULATORY_CARE_PROVIDER_SITE_OTHER): Payer: 59 | Admitting: Nurse Practitioner

## 2023-02-08 VITALS — BP 108/68 | HR 66 | Temp 97.5°F | Ht 65.0 in | Wt 233.8 lb

## 2023-02-08 DIAGNOSIS — S50811A Abrasion of right forearm, initial encounter: Secondary | ICD-10-CM | POA: Diagnosis not present

## 2023-02-08 DIAGNOSIS — Z01411 Encounter for gynecological examination (general) (routine) with abnormal findings: Secondary | ICD-10-CM

## 2023-02-08 DIAGNOSIS — Z1329 Encounter for screening for other suspected endocrine disorder: Secondary | ICD-10-CM | POA: Diagnosis not present

## 2023-02-08 DIAGNOSIS — Z23 Encounter for immunization: Secondary | ICD-10-CM | POA: Diagnosis not present

## 2023-02-08 DIAGNOSIS — Z0000 Encounter for general adult medical examination without abnormal findings: Secondary | ICD-10-CM

## 2023-02-08 DIAGNOSIS — R5383 Other fatigue: Secondary | ICD-10-CM

## 2023-02-08 DIAGNOSIS — Z01419 Encounter for gynecological examination (general) (routine) without abnormal findings: Secondary | ICD-10-CM

## 2023-02-08 DIAGNOSIS — Z780 Asymptomatic menopausal state: Secondary | ICD-10-CM

## 2023-02-08 LAB — CUP PACEART REMOTE DEVICE CHECK
Battery Remaining Longevity: 91 mo
Battery Voltage: 2.99 V
Brady Statistic AP VP Percent: 39.61 %
Brady Statistic AP VS Percent: 0 %
Brady Statistic AS VP Percent: 60.32 %
Brady Statistic AS VS Percent: 0.08 %
Brady Statistic RA Percent Paced: 40.99 %
Brady Statistic RV Percent Paced: 99.91 %
Date Time Interrogation Session: 20241028214056
Implantable Lead Connection Status: 753985
Implantable Lead Connection Status: 753985
Implantable Lead Connection Status: 753985
Implantable Lead Implant Date: 20100201
Implantable Lead Implant Date: 20100201
Implantable Lead Implant Date: 20220706
Implantable Lead Location: 753858
Implantable Lead Location: 753859
Implantable Lead Location: 753860
Implantable Lead Model: 4598
Implantable Lead Model: 5076
Implantable Lead Model: 5076
Implantable Pulse Generator Implant Date: 20220706
Lead Channel Impedance Value: 323 Ohm
Lead Channel Impedance Value: 342 Ohm
Lead Channel Impedance Value: 399 Ohm
Lead Channel Impedance Value: 418 Ohm
Lead Channel Impedance Value: 437 Ohm
Lead Channel Impedance Value: 437 Ohm
Lead Channel Impedance Value: 475 Ohm
Lead Channel Impedance Value: 475 Ohm
Lead Channel Impedance Value: 513 Ohm
Lead Channel Impedance Value: 722 Ohm
Lead Channel Impedance Value: 760 Ohm
Lead Channel Impedance Value: 760 Ohm
Lead Channel Impedance Value: 779 Ohm
Lead Channel Impedance Value: 817 Ohm
Lead Channel Pacing Threshold Amplitude: 1.25 V
Lead Channel Pacing Threshold Pulse Width: 0.4 ms
Lead Channel Sensing Intrinsic Amplitude: 1 mV
Lead Channel Sensing Intrinsic Amplitude: 1 mV
Lead Channel Sensing Intrinsic Amplitude: 3.625 mV
Lead Channel Setting Pacing Amplitude: 1.5 V
Lead Channel Setting Pacing Amplitude: 2 V
Lead Channel Setting Pacing Amplitude: 2.5 V
Lead Channel Setting Pacing Pulse Width: 0.4 ms
Lead Channel Setting Pacing Pulse Width: 0.8 ms
Lead Channel Setting Sensing Sensitivity: 0.9 mV
Zone Setting Status: 755011
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
## 2023-02-09 ENCOUNTER — Encounter: Payer: Self-pay | Admitting: Nurse Practitioner

## 2023-02-09 MED ORDER — TRAMADOL HCL 50 MG PO TABS: 50.0000 mg | ORAL_TABLET | Freq: Three times a day (TID) | ORAL | 0 refills | Status: DC | PRN

## 2023-02-19 ENCOUNTER — Other Ambulatory Visit: Payer: Self-pay | Admitting: Family Medicine

## 2023-02-19 NOTE — Telephone Encounter (Signed)
Left a message for a call back to inform per drs notes

## 2023-02-21 ENCOUNTER — Other Ambulatory Visit (HOSPITAL_COMMUNITY): Payer: Self-pay | Admitting: Family Medicine

## 2023-02-21 DIAGNOSIS — Z1231 Encounter for screening mammogram for malignant neoplasm of breast: Secondary | ICD-10-CM

## 2023-02-23 NOTE — Telephone Encounter (Signed)
Left a message for a return call to inform per the following drs notes regarding trazodone 100 mg  [name], [place]  [facility] Clinical Phone Number: [phone]   I have not been prescribing this medications. She needs to be aware that there is an increased risk of serotonin syndrome when using high-dose trazodone as well as tramadol and Cymbalta.  Copied from CRM [id number]. Topic: General - Call Back - No Documentation >> Feb 23, 2023  8:07 AM Desma Mcgregor wrote: Reason for CRM: Pt returning call for [facility] from Monday. CB# [phone] >> Feb 23, 2023  3:15 PM Providence Lanius wrote: This needs to be routed to the clinical pool

## 2023-02-26 NOTE — Progress Notes (Signed)
Remote pacemaker transmission.   

## 2023-03-05 ENCOUNTER — Other Ambulatory Visit: Payer: Self-pay | Admitting: Family Medicine

## 2023-03-05 MED ORDER — TRAZODONE HCL 100 MG PO TABS: 200.0000 mg | ORAL_TABLET | Freq: Every day | ORAL | 1 refills | Status: DC

## 2023-03-05 NOTE — Telephone Encounter (Signed)
Spoke with patient she did state she has been taking cymbalta for about 3 yrs and trazodone for about 21yrs , tramadol one tab as needed. She states she has not had any problem with serotonin syndrome at all. She has received a refill for trazodone from another provider.

## 2023-04-02 ENCOUNTER — Ambulatory Visit (HOSPITAL_COMMUNITY)
Admission: RE | Admit: 2023-04-02 | Discharge: 2023-04-02 | Disposition: A | Discharge status: Home / Self Care | Payer: 59 | Source: Ambulatory Visit | Attending: Family Medicine | Admitting: Family Medicine

## 2023-04-02 ENCOUNTER — Ambulatory Visit (HOSPITAL_COMMUNITY): Payer: 59

## 2023-04-02 ENCOUNTER — Ambulatory Visit (HOSPITAL_COMMUNITY)
Admission: RE | Admit: 2023-04-02 | Discharge: 2023-04-02 | Disposition: A | Discharge status: Home / Self Care | Payer: 59 | Source: Ambulatory Visit | Attending: Nurse Practitioner | Admitting: Nurse Practitioner

## 2023-04-02 DIAGNOSIS — Z780 Asymptomatic menopausal state: Secondary | ICD-10-CM | POA: Diagnosis not present

## 2023-04-02 DIAGNOSIS — E559 Vitamin D deficiency, unspecified: Secondary | ICD-10-CM | POA: Diagnosis not present

## 2023-04-02 DIAGNOSIS — Z1231 Encounter for screening mammogram for malignant neoplasm of breast: Secondary | ICD-10-CM | POA: Insufficient documentation

## 2023-04-02 DIAGNOSIS — D508 Other iron deficiency anemias: Secondary | ICD-10-CM | POA: Diagnosis not present

## 2023-04-02 DIAGNOSIS — R5383 Other fatigue: Secondary | ICD-10-CM | POA: Diagnosis not present

## 2023-04-02 DIAGNOSIS — Z1329 Encounter for screening for other suspected endocrine disorder: Secondary | ICD-10-CM | POA: Diagnosis not present

## 2023-04-03 ENCOUNTER — Encounter: Payer: Self-pay | Admitting: Physician Assistant

## 2023-04-03 LAB — TSH: TSH: 1.06 u[IU]/mL (ref 0.450–4.500)

## 2023-04-03 LAB — T4, FREE: Free T4: 1.09 ng/dL (ref 0.82–1.77)

## 2023-04-06 ENCOUNTER — Encounter: Payer: Self-pay | Admitting: Nurse Practitioner

## 2023-04-06 DIAGNOSIS — M8580 Other specified disorders of bone density and structure, unspecified site: Secondary | ICD-10-CM | POA: Insufficient documentation

## 2023-04-08 ENCOUNTER — Other Ambulatory Visit: Payer: Self-pay | Admitting: Family Medicine

## 2023-04-14 ENCOUNTER — Other Ambulatory Visit: Payer: Self-pay | Admitting: Family Medicine

## 2023-04-14 ENCOUNTER — Other Ambulatory Visit: Payer: Self-pay | Admitting: Internal Medicine

## 2023-04-16 MED ORDER — EMPAGLIFLOZIN 10 MG PO TABS: 10.0000 mg | ORAL_TABLET | Freq: Every day | ORAL | 0 refills | Status: DC

## 2023-05-07 ENCOUNTER — Encounter: Payer: Self-pay | Admitting: Hematology

## 2023-05-08 ENCOUNTER — Encounter: Payer: Self-pay | Admitting: Hematology

## 2023-05-08 ENCOUNTER — Ambulatory Visit: Payer: Commercial Managed Care - HMO

## 2023-05-09 NOTE — Progress Notes (Signed)
VIRTUAL VISIT via TELEPHONE NOTE [facility]   I connected with [name] [name]  on 05/14/2023 at 2:50 PM by telephone and verified that I am speaking with the correct person using two identifiers.  Location: Patient: Home Provider: [facility]   I discussed the limitations, risks, security and privacy concerns of performing an evaluation and management service by telephone and the availability of in person appointments. I also discussed with the patient that there may be a patient responsible charge related to this service. The patient expressed understanding and agreed to proceed.  REASON FOR VISIT:  Follow-up for iron deficiency anemia   CURRENT THERAPY: IV iron  INTERVAL HISTORY:  [name] [name] is contacted today for follow-up of severe iron deficiency anemia.  She was last evaluated via telemedicine visit with Rojelio Brenner PA-C on 11/03/2022.  Overall, she reports that she is feeling fairly well.   She denies any current fatigue.  She notes some increased lightheadedness that she attributes to blood pressure being on the lower side, possibly worsened by some self-reported dehydration.  She denies any pica, restless legs, headaches, chest pain, dyspnea, or syncope.  She has not noticed any rectal bleeding, melena, epistaxis, or hematemesis.  She has been taking increased dose of Vitamin D at 2000 units daily since last visit. She has 100% energy and 100% appetite. She is intentionally losing weight with diet and exercise, down to about 212 pounds per her report.   ASSESSMENT & PLAN:  1.  Severe iron deficiency anemia: - Referred by PCP (Dr. Everlene Other) for evaluation and treatment of iron deficiency anemia after labs from 05/15/2022 showed ferritin 14, iron saturation 5%, hemoglobin 10.3/MCV 74 - She had gastric bypass surgery in 2005 - Unable to tolerate iron pill due to constipation.  Required previous IV iron infusions at [facility] /  [facility]. - No history of blood transfusions. - EGD (12/28/2020): Overall normal with widely patent gastrojejunal anastomosis - Colonoscopy (12/28/2020): Limited due to poor prep, but no gross lesions noted - Most recent IV iron with Venofer 500 mg x 2 doses on 05/23/2022 and 05/30/2022 - Denies any rectal bleeding or melena  - Most recent labs (04/02/2023 via [facility]): Hgb 15.3/MCV 97.  Iron saturation 46%, ferritin 86 - DIFFERENTIAL DIAGNOSIS favors iron deficiency anemia secondary to malabsorption s/p gastric bypass surgery - PLAN: No indication for IV iron at this time.  Will recheck CBC and iron panel in 6 months.     2.  Other nutritional deficiencies secondary to history gastric bypass surgery - Additional nutritional deficiencies: Normal folate >20 (07/20/2022) Normal copper 127 (07/20/2022) Normal B12 460, normal MMA 311 (04/02/2023) Vitamin D remains low at 29.9 (04/02/2023) - She takes multivitamin at home daily.  Taking vitamin D 2000 units daily for the past 6 months  - PLAN: Continue daily multivitamin. - Recommend increasing OTC vitamin D3 to 4000  units daily.  Will recheck vitamin D levels at follow-up in 6 months, and consider switching to weekly high-dose vitamin D if no improvement.   3.  Social/family history: - PMH includes congestive heart failure and cardiomyopathy - Independent of ADLs and IADLs.  She is a retired Charity fundraiser, worked at [employer]. - Lifelong non-smoker. - No family history of anemia. - Sister died of breast cancer.  Another sister had glioblastoma.  Brother had multiple myeloma.  Another sister had breast cancer.   PLAN SUMMARY:  >> Labs in 6 months via [facility] = CBC/D,  ferritin, iron/TIBC, vitamin D, B12, MMA >> OFFICE visit in 6 months (1 to 2 weeks after labs)   **Last office visit 07/26/2022     REVIEW OF SYSTEMS:   Review of Systems  Constitutional:  Negative for chills, diaphoresis, fever, malaise/fatigue and weight loss.  Respiratory:  Negative  for cough and shortness of breath.   Cardiovascular:  Negative for chest pain and palpitations.  Gastrointestinal:  Negative for abdominal pain, blood in stool, melena, nausea and vomiting.  Neurological:  Positive for dizziness (lightheaded in the mornings). Negative for headaches.     PHYSICAL EXAM: (per limitations of virtual telephone visit)  The patient is alert and oriented x 3, exhibiting adequate mentation, good mood, and ability to speak in full sentences and execute sound judgement.  WRAP UP:   I discussed the assessment and treatment plan with the patient. The patient was provided an opportunity to ask questions and all were answered. The patient agreed with the plan and demonstrated an understanding of the instructions.   The patient was advised to call back or seek an in-person evaluation if the symptoms worsen or if the condition fails to improve as anticipated.  I provided 22 minutes of non-face-to-face time during this encounter, including >10 minutes of medical discussion  Carnella Guadalajara, Cordelia Poche 05/14/2023 3:30 PM

## 2023-05-14 ENCOUNTER — Inpatient Hospital Stay: Payer: Commercial Managed Care - HMO | Attending: Physician Assistant | Admitting: Physician Assistant

## 2023-05-14 DIAGNOSIS — D508 Other iron deficiency anemias: Secondary | ICD-10-CM

## 2023-05-14 DIAGNOSIS — E559 Vitamin D deficiency, unspecified: Secondary | ICD-10-CM

## 2023-05-15 ENCOUNTER — Ambulatory Visit (INDEPENDENT_AMBULATORY_CARE_PROVIDER_SITE_OTHER): Payer: 59

## 2023-05-15 DIAGNOSIS — I480 Paroxysmal atrial fibrillation: Secondary | ICD-10-CM

## 2023-05-15 DIAGNOSIS — I442 Atrioventricular block, complete: Secondary | ICD-10-CM | POA: Diagnosis not present

## 2023-05-16 ENCOUNTER — Telehealth: Payer: Self-pay

## 2023-05-16 LAB — CUP PACEART REMOTE DEVICE CHECK
Battery Remaining Longevity: 80 mo
Battery Voltage: 2.99 V
Brady Statistic AP VP Percent: 40.67 %
Brady Statistic AP VS Percent: 0 %
Brady Statistic AS VP Percent: 59.33 %
Brady Statistic AS VS Percent: 0.02 %
Brady Statistic RA Percent Paced: 42.24 %
Brady Statistic RV Percent Paced: 99.98 %
Date Time Interrogation Session: 20250204072946
Implantable Lead Connection Status: 753985
Implantable Lead Connection Status: 753985
Implantable Lead Connection Status: 753985
Implantable Lead Implant Date: 20100201
Implantable Lead Implant Date: 20100201
Implantable Lead Implant Date: 20220706
Implantable Lead Location: 753858
Implantable Lead Location: 753859
Implantable Lead Location: 753860
Implantable Lead Model: 4598
Implantable Lead Model: 5076
Implantable Lead Model: 5076
Implantable Pulse Generator Implant Date: 20220706
Lead Channel Impedance Value: 323 Ohm
Lead Channel Impedance Value: 323 Ohm
Lead Channel Impedance Value: 399 Ohm
Lead Channel Impedance Value: 399 Ohm
Lead Channel Impedance Value: 399 Ohm
Lead Channel Impedance Value: 418 Ohm
Lead Channel Impedance Value: 437 Ohm
Lead Channel Impedance Value: 456 Ohm
Lead Channel Impedance Value: 475 Ohm
Lead Channel Impedance Value: 665 Ohm
Lead Channel Impedance Value: 703 Ohm
Lead Channel Impedance Value: 741 Ohm
Lead Channel Impedance Value: 741 Ohm
Lead Channel Impedance Value: 779 Ohm
Lead Channel Pacing Threshold Amplitude: 1.375 V
Lead Channel Pacing Threshold Pulse Width: 0.4 ms
Lead Channel Sensing Intrinsic Amplitude: 1 mV
Lead Channel Sensing Intrinsic Amplitude: 1 mV
Lead Channel Sensing Intrinsic Amplitude: 3.625 mV
Lead Channel Setting Pacing Amplitude: 1.5 V
Lead Channel Setting Pacing Amplitude: 2 V
Lead Channel Setting Pacing Amplitude: 2.75 V
Lead Channel Setting Pacing Pulse Width: 0.4 ms
Lead Channel Setting Pacing Pulse Width: 0.8 ms
Lead Channel Setting Sensing Sensitivity: 0.9 mV
Zone Setting Status: 755011

## 2023-05-16 NOTE — Telephone Encounter (Signed)
 Spoke with patient and make her aware. She verbalizes understanding of all instructions.

## 2023-05-16 NOTE — Telephone Encounter (Signed)
 Reviewed optivol and decreased urination sx's with Dr. Fernande.  He orders the following: Take the extra Torsemide  Keep follow up with Dr. Fernande to review programming changes on 05/30/23.  If becomes sx or feeling worse (decreased urination, increased swelling, SOB etc..) call the office or go to ER if sx's severe.   LM on patient's VM to discuss.

## 2023-05-16 NOTE — Telephone Encounter (Addendum)
 Scheduled remote reviewed. Normal device function.   % Effective CRT pacing not available. Consider increasing LV pacing output, changing LV pacing cathode (if lead polarization), or increasing V-V Pace Delay (if delayed LV activation).  9196 AT/AF classified episodes, longest 43 min 52 sec on 05/14/23 at 23:45, available EGMs c/w competitive atrial pacing and possible FFOS on atrial channel with intermittent PAT, V rates are controlled, Burden 61.8%, not on OAC per Epic. Routed to clinic for increased burden correlating with [facility] and competitive atrial pacing. HF diagostics currently abnormal.  Next remote   Patient states she is asymptomatic other than noted decreased urine output this past week. Denies vis s/s of fluid retention and no SOB on exertion. Has appt with Dr. Fernande on 05/30/23.  Will adjust programming at that time.   Review with Dr. Fernande to consider if patient should have a BMP before appt to assess renal function.

## 2023-05-30 ENCOUNTER — Ambulatory Visit: Payer: Commercial Managed Care - HMO | Admitting: Internal Medicine

## 2023-05-30 ENCOUNTER — Encounter: Payer: Self-pay | Admitting: Hematology & Oncology

## 2023-06-07 ENCOUNTER — Other Ambulatory Visit: Payer: Self-pay | Admitting: Family Medicine

## 2023-06-07 ENCOUNTER — Other Ambulatory Visit: Payer: Self-pay | Admitting: Internal Medicine

## 2023-06-08 MED ORDER — EMPAGLIFLOZIN 10 MG PO TABS: 10.0000 mg | ORAL_TABLET | Freq: Every day | ORAL | 0 refills | Status: DC

## 2023-06-08 MED ORDER — ATORVASTATIN CALCIUM 40 MG PO TABS: 40.0000 mg | ORAL_TABLET | Freq: Every day | ORAL | 0 refills | Status: DC

## 2023-06-09 MED ORDER — DULOXETINE HCL 60 MG PO CPEP
60.0000 mg | ORAL_CAPSULE | Freq: Every day | ORAL | 0 refills | Status: DC
Start: 2023-06-09 — End: 2023-10-03

## 2023-06-11 ENCOUNTER — Encounter: Payer: Self-pay | Admitting: Family Medicine

## 2023-06-11 ENCOUNTER — Other Ambulatory Visit: Payer: Self-pay | Admitting: Family Medicine

## 2023-06-11 MED ORDER — TRAZODONE HCL 100 MG PO TABS: 200.0000 mg | ORAL_TABLET | Freq: Every day | ORAL | 1 refills | Status: AC

## 2023-06-11 NOTE — Progress Notes (Unsigned)
 Electrophysiology Office Note:   Date:  06/13/2023  ID:  IZZABELL KLASEN, DOB Apr 25, 1960, MRN [id number]  Primary Cardiologist: [name], MD Primary Heart Failure: None Electrophysiologist: Sherryl Manges, MD       History of Present Illness:   DANETRA GLOCK is a 63 y.o. female with h/o AT, AF, tachy-induced cardiomyopathy s/p AV node ablation in 1995 with PPM, CRT upgrade in 09/2020 due to HFrEF, severe IDA seen today for routine electrophysiology followup.   Device clinic review 2/5 noted 9196 AT/AF classified episodes.  EGM's c/w competitive atrial pacing and possible FFOS on atrial channel with intermittent PAT, V-rates controlled, burden 61.8%, HF diagnostics were abnormal.  She was instructed to take additional torsemide and follow up on 05/30/23 as scheduled.   Since last being seen in our clinic the patient reports she feels well overall. She is expecting a grandson in August. She has had a cold recently and has been taking Alka-Seltzer for her cold which she "knows she shouldn't as it has a lot of salt in it".  She does not feel like she is more swollen and her weight has been stable at home. She has been taking her torsemide at home but does not feel she has had a significant response to it.   She denies chest pain, palpitations, dyspnea, PND, orthopnea, nausea, vomiting, dizziness, syncope, edema, weight gain, or early satiety.   Review of systems complete and found to be negative unless listed in HPI.   EP Information / Studies Reviewed:    EKG is ordered today. Personal review as below.  EKG Interpretation Date/Time:  Wednesday June 13 2023 09:20:36 EST Ventricular Rate:  68 PR Interval:    QRS Duration:  120 QT Interval:  454 QTC Calculation: 482 R Axis:   167  Text Interpretation: Ventricular-paced rhythm Confirmed by Canary Brim ([id number]) on 06/13/2023 10:35:45 AM   PPM Interrogation-  reviewed in detail today,  See PACEART report.  Device  History: Medtronic Dual Chamber PPM implanted 1995 for CHB s/p AV node ablation.  Generator change: 2009, extraction 2010 due to infection with re-implant on the right side.  CRT-P upgrade 09/2020  Studies:  TEE 01/2020 > LVEF 45-50%, MR mod ECHO 03/2021 > LVEF 50-55%, mild MR     Arrhythmia / AAD AF s/p AV junction ablation    Risk Assessment/Calculations:    CHA2DS2-VASc Score = 2   This indicates a 2.2% annual risk of stroke. The patient's score is based upon: CHF History: 1 HTN History: 0 Diabetes History: 0 Stroke History: 0 Vascular Disease History: 0 Age Score: 0 Gender Score: 1              Physical Exam:   VS:  BP (!) 80/62   Pulse 81   Ht 5\' 5"  (1.651 m)   Wt 215 lb 3.2 oz (97.6 kg)   SpO2 97%   BMI 35.81 kg/m    Wt Readings from Last 3 Encounters:  06/13/23 215 lb 3.2 oz (97.6 kg)  02/08/23 233 lb 12.8 oz (106.1 kg)  10/27/22 248 lb 3.2 oz (112.6 kg)     GEN: Well nourished, well developed in no acute distress NECK: No JVD; No carotid bruits CARDIAC: Regular rate and rhythm, no murmurs, rubs, gallops RESPIRATORY:  Clear to auscultation without rales, wheezing or rhonchi  ABDOMEN: Soft, non-tender, non-distended EXTREMITIES:  No edema; No deformity   ASSESSMENT AND PLAN:    CHB s/p AV junction ablation s/p Medtronic PPM  NICM -  Normal PPM function -See Arita Miss Art report -EKG reviewed, QRS remains narrow at 120 ms compared to July 2024  -device programming reviewed with industry in clinic, LV output programmed to 2V at 0.29ms for safety margin in the event she is intermittently capturing (although no evidence of such, she has been stable). She is programmed DDIR due to her AF. Per pt, she had phrenic nerve stimulation in the past and LV Capture Mgmt was not turned on in this setting. We will not be able to get an effective pacing dx if we do not have LV Capture Mgmt on.  In regards to her atrial findings, she has frequent PAC's, AT and appropriate  blanking.  No adjustments there.  -OptiVol up but she has had a cold, feels euvolemic   Atrial Tachycardia / Atrial Fibrillation  CHA2DS2-VASc 1, dyspnea with rhythm, no improvement in symptoms with DCCV attempts -continue monitoring by device  -not on OAC per prior discussion with Dr. Graciela Husbands     HFrecEF Chronic systolic, LVEF recovered post CRT-P  -euvolemic on exam    -continue torsemide    -consider labs at next visit > BMP, Mg+  Severe IDA  -follows with ONC  Disposition:   Follow up with EP APP in 4 weeks to assess fluid status  Signed, Canary Brim, NP-C, AGACNP-BC [facility] - Electrophysiology  06/13/2023, 10:50 AM

## 2023-06-13 ENCOUNTER — Ambulatory Visit: Payer: Commercial Managed Care - HMO | Attending: Pulmonary Disease | Admitting: Pulmonary Disease

## 2023-06-13 ENCOUNTER — Encounter: Payer: Self-pay | Admitting: Pulmonary Disease

## 2023-06-13 VITALS — BP 80/62 | HR 81 | Ht 65.0 in | Wt 215.2 lb

## 2023-06-13 DIAGNOSIS — I4719 Other supraventricular tachycardia: Secondary | ICD-10-CM

## 2023-06-13 DIAGNOSIS — I428 Other cardiomyopathies: Secondary | ICD-10-CM

## 2023-06-13 DIAGNOSIS — Z950 Presence of cardiac pacemaker: Secondary | ICD-10-CM

## 2023-06-13 DIAGNOSIS — I442 Atrioventricular block, complete: Secondary | ICD-10-CM | POA: Diagnosis not present

## 2023-06-13 DIAGNOSIS — I5032 Chronic diastolic (congestive) heart failure: Secondary | ICD-10-CM

## 2023-06-13 DIAGNOSIS — I480 Paroxysmal atrial fibrillation: Secondary | ICD-10-CM | POA: Diagnosis not present

## 2023-06-13 LAB — CUP PACEART INCLINIC DEVICE CHECK
Date Time Interrogation Session: 20250305104700
Implantable Lead Connection Status: 753985
Implantable Lead Connection Status: 753985
Implantable Lead Connection Status: 753985
Implantable Lead Implant Date: 20100201
Implantable Lead Implant Date: 20100201
Implantable Lead Implant Date: 20220706
Implantable Lead Location: 753858
Implantable Lead Location: 753859
Implantable Lead Location: 753860
Implantable Lead Model: 4598
Implantable Lead Model: 5076
Implantable Lead Model: 5076
Implantable Pulse Generator Implant Date: 20220706

## 2023-06-13 NOTE — Patient Instructions (Signed)
 Medication Instructions:  Your physician recommends that you continue on your current medications as directed. Please refer to the Current Medication list given to you today.  *If you need a refill on your cardiac medications before your next appointment, please call your pharmacy*  Lab Work: None ordered If you have labs (blood work) drawn today and your tests are completely normal, you will receive your results only by: MyChart Message (if you have MyChart) OR A paper copy in the mail If you have any lab test that is abnormal or we need to change your treatment, we will call you to review the results.  Follow-Up: At [facility], you and your health needs are our priority.  As part of our continuing mission to provide you with exceptional heart care, we have created designated Provider Care Teams.  These Care Teams include your primary Cardiologist (physician) and Advanced Practice Providers (APPs -  Physician Assistants and Nurse Practitioners) who all work together to provide you with the care you need, when you need it.  Your next appointment:   1 month(s)  Provider:   Canary Brim, NP

## 2023-06-21 ENCOUNTER — Telehealth: Payer: Commercial Managed Care - HMO | Admitting: Family Medicine

## 2023-06-21 DIAGNOSIS — G4700 Insomnia, unspecified: Secondary | ICD-10-CM | POA: Diagnosis not present

## 2023-06-21 DIAGNOSIS — E785 Hyperlipidemia, unspecified: Secondary | ICD-10-CM | POA: Diagnosis not present

## 2023-06-21 MED ORDER — ROSUVASTATIN CALCIUM 20 MG PO TABS: 20.0000 mg | ORAL_TABLET | Freq: Every day | ORAL | 3 refills | Status: DC

## 2023-06-21 NOTE — Addendum Note (Signed)
 Addended by: Geralyn Flash D on: 06/21/2023 11:35 AM   Modules accepted: Orders

## 2023-06-21 NOTE — Progress Notes (Signed)
 Virtual Visit via Video Note  I connected with [name] [name] on 06/21/23 at  1:30 PM EDT by a video enabled telemedicine application and verified that I am speaking with the correct person using two identifiers.  Location: Patient: Home Provider: Office   I discussed the limitations of evaluation and management by telemedicine and the availability of in person appointments. The patient expressed understanding and agreed to proceed.  History of Present Illness: 63 year old for female with a history of nonischemic cardiomyopathy/combined congestive heart failure, osteopenia, iron deficiency anemia, obesity, history of AV block requiring pacemaker presents for follow-up.  Patient states that she wanted to follow-up on her trazodone.  She is no longer taking tramadol making serotonin syndrome and unlikely event with use of trazodone and Cymbalta.  Patient states that she has experienced side effects from Lipitor with pain in her legs and it improved after cessation.  She would like to discuss alternative pharmacotherapy today.    Observations/Objective: General: Well-appearing and in no acute distress. Respiratory: Speaking in full sentences.  Assessment and Plan:  Hyperlipidemia -Patient experiencing side effects from treatment.  Stopping atorvastatin.  Starting Crestor.  Insomnia -Stable on trazodone.  Continue.  Recently refilled.    Follow Up Instructions:    I discussed the assessment and treatment plan with the patient. The patient was provided an opportunity to ask questions and all were answered. The patient agreed with the plan and demonstrated an understanding of the instructions.   The patient was advised to call back or seek an in-person evaluation if the symptoms worsen or if the condition fails to improve as anticipated.  I provided 7 minutes of non-face-to-face time during this encounter.   [name], DO

## 2023-06-21 NOTE — Progress Notes (Signed)
 Remote pacemaker transmission.

## 2023-07-09 NOTE — Progress Notes (Signed)
 Electrophysiology Office Note:   Date:  07/17/2023  ID:  [name] [name], DOB 07/28/1960, MRN [id number]  Primary Cardiologist: [name], MD Primary Heart Failure: None Electrophysiologist: Sherryl Manges, MD      History of Present Illness:   [name] [name] is a 63 y.o. female with h/o AT, AF, tachy-induced cardiomyopathy s/p AV node ablation in 1995 with PPM, CRT upgrade in 09/2020 due to HFrEF, severe IDA  seen today for routine electrophysiology followup.  Seen in EP Clinic 06/13/22 and EKG assessed with QRS , device programming reviewed.     Since last being seen in our clinic the patient reports she has been doing well overall.  No perceived changes after recent device adjustment with Dr. Graciela Husbands.  She notes that she is short of breath with exertion but she has always experienced this with activity.    She denies chest pain, palpitations, dyspnea, PND, orthopnea, nausea, vomiting, dizziness, syncope, edema, weight gain, or early satiety.   Review of systems complete and found to be negative unless listed in HPI.   EP Information / Studies Reviewed:    EKG is ordered today. Personal review as below.  EKG Interpretation Date/Time:  Tuesday July 17 2023 08:07:41 EDT Ventricular Rate:  72 PR Interval:    QRS Duration:  126 QT Interval:  450 QTC Calculation: 492 R Axis:   202  Text Interpretation: Ventricular-paced rhythm Confirmed by Canary Brim ([id number]) on 07/17/2023 8:11:33 AM   PPM Interrogation-  reviewed in detail today,  See PACEART report.  Device History: Medtronic Dual Chamber PPM implanted 1995 for CHB s/p AV node ablation  Generator change: 2009, extraction 2010 due to infection with re-implant on the right side. CRT-P upgrade 09/2020   Studies:  TEE 01/2020 > LVEF 45-50%, MR mod ECHO 03/2021 > LVEF 50-55%, mild MR     Arrhythmia / AAD AF s/p AV junction ablation   Risk Assessment/Calculations:    CHA2DS2-VASc Score = 2   This indicates a 2.2%  annual risk of stroke. The patient's score is based upon: CHF History: 1 HTN History: 0 Diabetes History: 0 Stroke History: 0 Vascular Disease History: 0 Age Score: 0 Gender Score: 1              Physical Exam:   VS:  BP (!) 89/64   Pulse 72   Ht 5\' 5"  (1.651 m)   Wt 204 lb 9.6 oz (92.8 kg)   SpO2 98%   BMI 34.05 kg/m    Wt Readings from Last 3 Encounters:  07/17/23 204 lb 9.6 oz (92.8 kg)  06/13/23 215 lb 3.2 oz (97.6 kg)  02/08/23 233 lb 12.8 oz (106.1 kg)     GEN: Well nourished, well developed in no acute distress NECK: No JVD; No carotid bruits CARDIAC: Regular rate and rhythm, no murmurs, rubs, gallops RESPIRATORY:  Clear to auscultation without rales, wheezing or rhonchi  ABDOMEN: Soft, non-tender, non-distended EXTREMITIES:  No edema; No deformity   ASSESSMENT AND PLAN:    CHB s/p Medtronic CRT-P  DDIR programming in setting of AF -PPM function -See Pace Art report -No changes today -EKG with QRS of 126 ms   Atrial Tachycardia / Atrial Fibrillation  CHA2DS2-VASc 2, dyspnea with rhythm, no improvement in sx with DCCV attempts  -unable to assess burden with DDIR programming -not on OAC per prior discussion with Dr. Graciela Husbands   HFrecEF / NICM Chronic systolic, LVEF recovered post CRT-P -euvolemic on exam  -torsemide 60 mg daily >  compliant  IDA  -follows with ONC Disposition:   Follow up with Dr. Graciela Husbands in 6 months  Signed, Canary Brim, NP-C, AGACNP-BC [facility] - Electrophysiology  07/17/2023, 7:41 PM

## 2023-07-17 ENCOUNTER — Ambulatory Visit: Attending: Cardiology | Admitting: Pulmonary Disease

## 2023-07-17 ENCOUNTER — Encounter: Payer: Self-pay | Admitting: Pulmonary Disease

## 2023-07-17 VITALS — BP 89/64 | HR 72 | Ht 65.0 in | Wt 204.6 lb

## 2023-07-17 DIAGNOSIS — I480 Paroxysmal atrial fibrillation: Secondary | ICD-10-CM | POA: Diagnosis not present

## 2023-07-17 DIAGNOSIS — Z950 Presence of cardiac pacemaker: Secondary | ICD-10-CM

## 2023-07-17 DIAGNOSIS — I442 Atrioventricular block, complete: Secondary | ICD-10-CM | POA: Diagnosis not present

## 2023-07-17 DIAGNOSIS — I4719 Other supraventricular tachycardia: Secondary | ICD-10-CM

## 2023-07-17 DIAGNOSIS — I428 Other cardiomyopathies: Secondary | ICD-10-CM

## 2023-07-17 LAB — CUP PACEART INCLINIC DEVICE CHECK
Date Time Interrogation Session: 20250408194010
Implantable Lead Connection Status: 753985
Implantable Lead Connection Status: 753985
Implantable Lead Connection Status: 753985
Implantable Lead Implant Date: 20100201
Implantable Lead Implant Date: 20100201
Implantable Lead Implant Date: 20220706
Implantable Lead Location: 753858
Implantable Lead Location: 753859
Implantable Lead Location: 753860
Implantable Lead Model: 4598
Implantable Lead Model: 5076
Implantable Lead Model: 5076
Implantable Pulse Generator Implant Date: 20220706

## 2023-07-17 NOTE — Patient Instructions (Signed)
 Medication Instructions:  Your physician recommends that you continue on your current medications as directed. Please refer to the Current Medication list given to you today.  *If you need a refill on your cardiac medications before your next appointment, please call your pharmacy*  Lab Work: None ordered If you have labs (blood work) drawn today and your tests are completely normal, you will receive your results only by: MyChart Message (if you have MyChart) OR A paper copy in the mail If you have any lab test that is abnormal or we need to change your treatment, we will call you to review the results.  Follow-Up: At [facility], you and your health needs are our priority.  As part of our continuing mission to provide you with exceptional heart care, our providers are all part of one team.  This team includes your primary Cardiologist (physician) and Advanced Practice Providers or APPs (Physician Assistants and Nurse Practitioners) who all work together to provide you with the care you need, when you need it.  Your next appointment:   6 month(s)  Provider:   Canary Brim, NP       1st Floor: - Lobby - Registration  - Pharmacy  - Lab - Cafe  2nd Floor: - PV Lab - Diagnostic Testing (echo, CT, nuclear med)  3rd Floor: - Vacant  4th Floor: - TCTS (cardiothoracic surgery) - AFib Clinic - Structural Heart Clinic - Vascular Surgery  - Vascular Ultrasound  5th Floor: - HeartCare Cardiology (general and EP) - Clinical Pharmacy for coumadin, hypertension, lipid, weight-loss medications, and med management appointments    Valet parking services will be available as well.

## 2023-08-06 ENCOUNTER — Ambulatory Visit: Admitting: Pulmonary Disease

## 2023-08-07 ENCOUNTER — Ambulatory Visit: Payer: 59

## 2023-08-14 ENCOUNTER — Ambulatory Visit: Payer: 59

## 2023-08-14 DIAGNOSIS — I442 Atrioventricular block, complete: Secondary | ICD-10-CM | POA: Diagnosis not present

## 2023-08-17 LAB — CUP PACEART REMOTE DEVICE CHECK
Battery Remaining Longevity: 66 mo
Battery Voltage: 2.97 V
Brady Statistic AP VP Percent: 35.4 %
Brady Statistic AP VS Percent: 0 %
Brady Statistic AS VP Percent: 64.5 %
Brady Statistic AS VS Percent: 0.09 %
Brady Statistic RA Percent Paced: 37.73 %
Brady Statistic RV Percent Paced: 99.89 %
Date Time Interrogation Session: 20250508111746
Implantable Lead Connection Status: 753985
Implantable Lead Connection Status: 753985
Implantable Lead Connection Status: 753985
Implantable Lead Implant Date: 20100201
Implantable Lead Implant Date: 20100201
Implantable Lead Implant Date: 20220706
Implantable Lead Location: 753858
Implantable Lead Location: 753859
Implantable Lead Location: 753860
Implantable Lead Model: 4598
Implantable Lead Model: 5076
Implantable Lead Model: 5076
Implantable Pulse Generator Implant Date: 20220706
Lead Channel Impedance Value: 323 Ohm
Lead Channel Impedance Value: 342 Ohm
Lead Channel Impedance Value: 418 Ohm
Lead Channel Impedance Value: 437 Ohm
Lead Channel Impedance Value: 437 Ohm
Lead Channel Impedance Value: 437 Ohm
Lead Channel Impedance Value: 475 Ohm
Lead Channel Impedance Value: 475 Ohm
Lead Channel Impedance Value: 532 Ohm
Lead Channel Impedance Value: 703 Ohm
Lead Channel Impedance Value: 741 Ohm
Lead Channel Impedance Value: 741 Ohm
Lead Channel Impedance Value: 779 Ohm
Lead Channel Impedance Value: 798 Ohm
Lead Channel Pacing Threshold Amplitude: 1.625 V
Lead Channel Pacing Threshold Pulse Width: 0.4 ms
Lead Channel Sensing Intrinsic Amplitude: 2 mV
Lead Channel Sensing Intrinsic Amplitude: 2 mV
Lead Channel Sensing Intrinsic Amplitude: 3.625 mV
Lead Channel Setting Pacing Amplitude: 2 V
Lead Channel Setting Pacing Amplitude: 2 V
Lead Channel Setting Pacing Amplitude: 3.5 V
Lead Channel Setting Pacing Pulse Width: 0.4 ms
Lead Channel Setting Pacing Pulse Width: 0.6 ms
Lead Channel Setting Sensing Sensitivity: 0.9 mV
Zone Setting Status: 755011

## 2023-08-24 ENCOUNTER — Ambulatory Visit: Payer: Self-pay | Admitting: Cardiovascular Disease

## 2023-09-26 NOTE — Progress Notes (Signed)
 Remote pacemaker transmission.

## 2023-09-26 NOTE — Addendum Note (Signed)
 Addended by: Lott Rouleau A on: 09/26/2023 09:19 AM   Modules accepted: Orders

## 2023-09-29 ENCOUNTER — Other Ambulatory Visit: Payer: Self-pay | Admitting: Family Medicine

## 2023-10-03 ENCOUNTER — Other Ambulatory Visit: Payer: Self-pay | Admitting: Family Medicine

## 2023-10-08 ENCOUNTER — Encounter: Payer: Self-pay | Admitting: Hematology

## 2023-10-08 ENCOUNTER — Other Ambulatory Visit (HOSPITAL_COMMUNITY): Payer: Self-pay

## 2023-10-08 ENCOUNTER — Ambulatory Visit: Admitting: Family Medicine

## 2023-10-08 ENCOUNTER — Encounter: Payer: Self-pay | Admitting: Family Medicine

## 2023-10-08 ENCOUNTER — Telehealth: Payer: Self-pay | Admitting: Pharmacy Technician

## 2023-10-08 VITALS — BP 90/54 | HR 78 | Temp 97.2°F | Ht 65.0 in | Wt 205.0 lb

## 2023-10-08 DIAGNOSIS — R7303 Prediabetes: Secondary | ICD-10-CM

## 2023-10-08 DIAGNOSIS — E66811 Obesity, class 1: Secondary | ICD-10-CM | POA: Diagnosis not present

## 2023-10-08 DIAGNOSIS — Z13220 Encounter for screening for lipoid disorders: Secondary | ICD-10-CM

## 2023-10-08 DIAGNOSIS — Z6834 Body mass index (BMI) 34.0-34.9, adult: Secondary | ICD-10-CM

## 2023-10-08 DIAGNOSIS — E669 Obesity, unspecified: Secondary | ICD-10-CM | POA: Insufficient documentation

## 2023-10-08 DIAGNOSIS — I5042 Chronic combined systolic (congestive) and diastolic (congestive) heart failure: Secondary | ICD-10-CM

## 2023-10-08 DIAGNOSIS — D509 Iron deficiency anemia, unspecified: Secondary | ICD-10-CM

## 2023-10-08 MED ORDER — TIRZEPATIDE-WEIGHT MANAGEMENT 2.5 MG/0.5ML ~~LOC~~ SOLN: 2.5000 mg | SUBCUTANEOUS | 0 refills | Status: DC

## 2023-10-08 NOTE — Progress Notes (Signed)
 Subjective:  Patient ID: [name] [name], female    DOB: [birth year]  Age: 63 y.o. MRN: [id number]  CC:   Chief Complaint  Patient presents with   Obesity    Would like to discuss weight loss medication.  Was on Mounjaro approx 3 years ago      HPI:  63 year old female presents for evaluation of the above.  Patient states that she has lost a substantial amount of weight over the past 2 years.  The EMR reflects that she is lost 88 pounds.  She states that now she is at a standstill.  She has had difficulty losing any additional weight.  Patient is interested in weight loss medication.  She would like to discuss this today.  Patient states that she was previously on Mounjaro.  She does have a significant cardiac history.  Additionally, patient would like routine labs.  She states that she feels like she is not urinating as frequently as she normally does.  She is concerned about this especially in the setting of diuretic use.  Recently saw cardiology.  Patient Active Problem List   Diagnosis Date Noted   Obesity 10/08/2023   Insomnia 06/21/2023   Osteopenia 04/06/2023   Chronic pain of both knees 05/16/2022   Chronic pain of both hips 05/16/2022   History of complete AV block 05/15/2022   Atrial fibrillation (HCC) 05/15/2022   Chronic combined systolic and diastolic CHF (congestive heart failure) (HCC) 05/15/2022   Iron  deficiency anemia    NICM (nonischemic cardiomyopathy) (HCC) 04/26/2019   PACEMAKER,MDT 03/02/2009    Social Hx   Social History   Socioeconomic History   Marital status: Married    Spouse name: Not on file   Number of children: Not on file   Years of education: Not on file   Highest education level: Bachelor's degree (e.g., BA, AB, BS)  Occupational History   Not on file  Tobacco Use   Smoking status: Never   Smokeless tobacco: Never  Vaping Use   Vaping status: Never Used  Substance and Sexual Activity   Alcohol use: Not Currently     Alcohol/week: 7.0 standard drinks of alcohol    Types: 7 Cans of beer per week   Drug use: No   Sexual activity: Not on file  Other Topics Concern   Not on file  Social History Narrative   She is retired from working as an Charity fundraiser at [employer], 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   Social Drivers of Corporate investment banker Strain: Low Risk  (07/14/2022)   Overall Financial Resource Strain (CARDIA)    Difficulty of Paying Living Expenses: Not hard at all  Food Insecurity: No Food Insecurity (07/14/2022)   Hunger Vital Sign    Worried About Running Out of Food in the Last Year: Never true    Ran Out of Food in the Last Year: Never true  Transportation Needs: No Transportation Needs (07/14/2022)   PRAPARE - Administrator, Civil Service (Medical): No    Lack of Transportation (Non-Medical): No  Physical Activity: Unknown (07/14/2022)   Exercise Vital Sign    Days of Exercise per Week: 0 days    Minutes of Exercise per Session: Not on file  Stress: No Stress Concern Present (07/14/2022)   [employer] of Occupational Health - Occupational Stress Questionnaire    Feeling of Stress : Not at all  Social Connections: Socially Integrated (07/14/2022)   Social Connection  and Isolation Panel    Frequency of Communication with Friends and Family: More than three times a week    Frequency of Social Gatherings with Friends and Family: Once a week    Attends Religious Services: More than 4 times per year    Active Member of [employer] or Organizations: Yes    Attends Engineer, structural: More than 4 times per year    Marital Status: Married    Review of Systems Per HPI  Objective:  BP (!) 90/54   Pulse 78   Temp (!) 97.2 F (36.2 C)   Ht 5' 5 (1.651 m)   Wt 205 lb (93 kg)   SpO2 98%   BMI 34.11 kg/m      10/08/2023    8:08 AM 07/17/2023    8:03 AM 06/13/2023    8:35 AM  BP/Weight  Systolic BP 90 89 80  Diastolic BP 54 64 62  Wt. (Lbs) 205 204.6  215.2  BMI 34.11 kg/m2 34.05 kg/m2 35.81 kg/m2    Physical Exam Vitals and nursing note reviewed.  Constitutional:      General: She is not in acute distress.    Appearance: She is obese.  HENT:     Head: Normocephalic and atraumatic.   Eyes:     General:        Right eye: No discharge.        Left eye: No discharge.     Conjunctiva/sclera: Conjunctivae normal.    Cardiovascular:     Rate and Rhythm: Normal rate and regular rhythm.  Pulmonary:     Effort: Pulmonary effort is normal.     Breath sounds: Normal breath sounds.   Neurological:     Mental Status: She is alert.     Lab Results  Component Value Date   WBC 5.2 10/25/2022   HGB 15.0 10/25/2022   HCT 45.0 10/25/2022   PLT 194 10/25/2022   GLUCOSE 88 10/25/2022   CHOL 160 11/07/2022   TRIG 99 11/07/2022   HDL 67 11/07/2022   LDLCALC 75 11/07/2022   ALT 27 11/07/2022   AST 30 11/07/2022   NA 141 10/25/2022   K 4.4 10/25/2022   CL 97 10/25/2022   CREATININE 0.92 10/25/2022   BUN 13 10/25/2022   CO2 29 10/25/2022   TSH 1.060 04/02/2023   INR 1.1 RATIO (H) 05/05/2008   HGBA1C 5.8 (H) 05/15/2022     Assessment & Plan:  Class 1 obesity with serious comorbidity and body mass index (BMI) of 34.0 to 34.9 in adult, unspecified obesity type Assessment & Plan: Patient is a good candidate for GLP-1 medication.  Will send in Zepbound.    Orders: -     Tirzepatide-Weight Management; Inject 2.5 mg into the skin once a week.  Dispense: 2 mL; Refill: 0  Iron  deficiency anemia, unspecified iron  deficiency anemia type -     CBC -     Iron , TIBC and Ferritin Panel  Chronic combined systolic and diastolic CHF (congestive heart failure) (HCC) -     CMP14+EGFR  Prediabetes -     Hemoglobin A1c  Screening, lipid -     Lipid panel    Follow-up:  Return in about 6 months (around 04/08/2024).  Jacqulyn Ahle DO [facility]

## 2023-10-08 NOTE — Assessment & Plan Note (Signed)
 Patient is a good candidate for GLP-1 medication.  Will send in Zepbound.

## 2023-10-08 NOTE — Patient Instructions (Signed)
 Labs today.  I sent in Zepbound. Awaiting insurance approval (or denial).  Follow up in 6 months.   Take care  Dr. Bluford

## 2023-10-08 NOTE — Telephone Encounter (Signed)
 Pharmacy Patient Advocate Encounter   Received notification from Onbase that prior authorization for Zepbound 2.5MG /0.5ML pen-injectors is required/requested.   Insurance verification completed.   The patient is insured through [employer] .   Per test claim: PA required; PA submitted to above mentioned insurance via CoverMyMeds Key/confirmation #/EOC [id number] Status is pending

## 2023-10-08 NOTE — Telephone Encounter (Signed)
 Pharmacy Patient Advocate Encounter  Received notification from [facility] that Prior Authorization for Zepbound 2.5MG /0.5ML pen-injector has been DENIED.      PA #/Case ID/Reference #: Key: [id number]

## 2023-10-09 ENCOUNTER — Ambulatory Visit: Payer: Self-pay | Admitting: Family Medicine

## 2023-10-09 LAB — HEMOGLOBIN A1C
Est. average glucose Bld gHb Est-mCnc: 103 mg/dL
Hgb A1c MFr Bld: 5.2 % (ref 4.8–5.6)

## 2023-10-09 LAB — CBC
Hematocrit: 42.9 % (ref 34.0–46.6)
Hemoglobin: 13.8 g/dL (ref 11.1–15.9)
MCH: 31.5 pg (ref 26.6–33.0)
MCHC: 32.2 g/dL (ref 31.5–35.7)
MCV: 98 fL — ABNORMAL HIGH (ref 79–97)
Platelets: 171 10*3/uL (ref 150–450)
RBC: 4.38 x10E6/uL (ref 3.77–5.28)
RDW: 11.7 % (ref 11.7–15.4)
WBC: 6.3 10*3/uL (ref 3.4–10.8)

## 2023-10-09 LAB — CMP14+EGFR
ALT: 18 IU/L (ref 0–32)
AST: 25 IU/L (ref 0–40)
Albumin: 4.5 g/dL (ref 3.9–4.9)
Alkaline Phosphatase: 143 IU/L — ABNORMAL HIGH (ref 44–121)
BUN/Creatinine Ratio: 16 (ref 12–28)
BUN: 15 mg/dL (ref 8–27)
Bilirubin Total: 0.4 mg/dL (ref 0.0–1.2)
CO2: 22 mmol/L (ref 20–29)
Calcium: 9.4 mg/dL (ref 8.7–10.3)
Chloride: 97 mmol/L (ref 96–106)
Creatinine, Ser: 0.96 mg/dL (ref 0.57–1.00)
Globulin, Total: 2.2 g/dL (ref 1.5–4.5)
Glucose: 96 mg/dL (ref 70–99)
Potassium: 4.9 mmol/L (ref 3.5–5.2)
Sodium: 140 mmol/L (ref 134–144)
Total Protein: 6.7 g/dL (ref 6.0–8.5)
eGFR: 67 mL/min/{1.73_m2} (ref >59)

## 2023-10-09 LAB — LIPID PANEL
Chol/HDL Ratio: 2.5 ratio (ref 0.0–4.4)
Cholesterol, Total: 208 mg/dL — ABNORMAL HIGH (ref 100–199)
HDL: 84 mg/dL (ref >39)
LDL Chol Calc (NIH): 112 mg/dL — ABNORMAL HIGH (ref 0–99)
Triglycerides: 69 mg/dL (ref 0–149)
VLDL Cholesterol Cal: 12 mg/dL (ref 5–40)

## 2023-10-09 LAB — IRON,TIBC AND FERRITIN PANEL
Ferritin: 25 ng/mL (ref 15–150)
Iron Saturation: 15 % (ref 15–55)
Iron: 62 ug/dL (ref 27–139)
Total Iron Binding Capacity: 421 ug/dL (ref 250–450)
UIBC: 359 ug/dL (ref 118–369)

## 2023-10-15 ENCOUNTER — Other Ambulatory Visit: Payer: Self-pay | Admitting: Cardiology

## 2023-10-17 ENCOUNTER — Encounter: Payer: Self-pay | Admitting: Cardiology

## 2023-10-18 ENCOUNTER — Encounter: Payer: Self-pay | Admitting: Internal Medicine

## 2023-10-19 NOTE — Telephone Encounter (Signed)
 Please review and advise.

## 2023-10-23 ENCOUNTER — Other Ambulatory Visit (HOSPITAL_COMMUNITY): Payer: Self-pay

## 2023-10-23 NOTE — Telephone Encounter (Signed)
 Patient is commercially insured so only assistance options available would be the copay card.

## 2023-10-26 ENCOUNTER — Encounter: Payer: Self-pay | Admitting: Nurse Practitioner

## 2023-10-26 ENCOUNTER — Ambulatory Visit: Attending: Nurse Practitioner | Admitting: Nurse Practitioner

## 2023-10-26 VITALS — BP 116/82 | HR 66 | Ht 65.0 in | Wt 208.0 lb

## 2023-10-26 DIAGNOSIS — I5032 Chronic diastolic (congestive) heart failure: Secondary | ICD-10-CM | POA: Diagnosis not present

## 2023-10-26 DIAGNOSIS — Z950 Presence of cardiac pacemaker: Secondary | ICD-10-CM

## 2023-10-26 DIAGNOSIS — R200 Anesthesia of skin: Secondary | ICD-10-CM

## 2023-10-26 DIAGNOSIS — R0609 Other forms of dyspnea: Secondary | ICD-10-CM | POA: Diagnosis not present

## 2023-10-26 DIAGNOSIS — I340 Nonrheumatic mitral (valve) insufficiency: Secondary | ICD-10-CM

## 2023-10-26 DIAGNOSIS — R202 Paresthesia of skin: Secondary | ICD-10-CM

## 2023-10-26 DIAGNOSIS — R002 Palpitations: Secondary | ICD-10-CM

## 2023-10-26 DIAGNOSIS — I480 Paroxysmal atrial fibrillation: Secondary | ICD-10-CM | POA: Diagnosis not present

## 2023-10-26 DIAGNOSIS — R5383 Other fatigue: Secondary | ICD-10-CM

## 2023-10-26 DIAGNOSIS — Z79899 Other long term (current) drug therapy: Secondary | ICD-10-CM

## 2023-10-26 DIAGNOSIS — I5042 Chronic combined systolic (congestive) and diastolic (congestive) heart failure: Secondary | ICD-10-CM

## 2023-10-26 DIAGNOSIS — E785 Hyperlipidemia, unspecified: Secondary | ICD-10-CM

## 2023-10-26 DIAGNOSIS — E669 Obesity, unspecified: Secondary | ICD-10-CM

## 2023-10-26 DIAGNOSIS — Z87898 Personal history of other specified conditions: Secondary | ICD-10-CM

## 2023-10-26 MED ORDER — TORSEMIDE 20 MG PO TABS: 80.0000 mg | ORAL_TABLET | Freq: Every day | ORAL | 6 refills | Status: AC

## 2023-10-26 MED ORDER — LOSARTAN POTASSIUM 25 MG PO TABS: 25.0000 mg | ORAL_TABLET | Freq: Every day | ORAL | 6 refills | Status: AC

## 2023-10-26 NOTE — Patient Instructions (Addendum)
 Medication Instructions:   Fleurette current supply of your Entresto , then begin Losartan 25mg  daily  Increase Torsemide  to 80mg  daily Continue all other medications.     Labwork:  BNP, CMET, Mg - orders given    Testing/Procedures:  Your physician has requested that you have an echocardiogram. Echocardiography is a painless test that uses sound waves to create images of your heart. It provides your doctor with information about the size and shape of your heart and how well your heart's chambers and valves are working. This procedure takes approximately one hour. There are no restrictions for this procedure. Please do NOT wear cologne, perfume, aftershave, or lotions (deodorant is allowed). Please arrive 15 minutes prior to your appointment time.  Please note: We ask at that you not bring children with you during ultrasound (echo/ vascular) testing. Due to room size and safety concerns, children are not allowed in the ultrasound rooms during exams. Our front office staff cannot provide observation of children in our lobby area while testing is being conducted. An adult accompanying a patient to their appointment will only be allowed in the ultrasound room at the discretion of the ultrasound technician under special circumstances. We apologize for any inconvenience.  Follow-Up:  4-6 weeks   Any Other Special Instructions Will Be Listed Below (If Applicable).   If you need a refill on your cardiac medications before your next appointment, please call your pharmacy.

## 2023-10-26 NOTE — Progress Notes (Signed)
 Cardiology Office Note:  .   Date: 10/26/2023 ID:  DEVITA NIES, DOB 05/14/1960, MRN [id number] PCP: Bluford Jacqulyn MATSU, DO  [facility] Providers Cardiologist:  Alvan Carrier, MD Electrophysiologist:  Elspeth Sage, MD {  History of Present Illness: .   [name] [name] is a 63 y.o. female with a PMH of HFpEF, tachycardia induced CM/HFpEF, PAF, hx of syncope, CHB, s/p AV junction ablation, hx of PPM, MR, and anemia who presents today for 1 year follow-up.   Last seen by Dr. Carrier Alvan 1 year ago. Was doing well at the time.   10/27/2022 - Today she presents for 1 year follow-up. States in June 2024 she called into office noting shortness of breath and stated she noted palpitations. Took an extra tablet of 20 mg of Torsemide  for 3-4 days and symptoms were resolved. Says she has been doing well recently. Denies any chest pain, shortness of breath, palpitations, syncope, presyncope, dizziness, orthopnea, PND, swelling or significant weight changes, acute bleeding, or claudication. Has lost around 50 lbs. Has noticed her SBP's have been in upper 90's recently, asymptomatic with these readings.   10/26/2023 - Here for follow-up. Hasn't been doing well recently. For the past month, she admits to feeling more short of breath and sensation of more fluid retention. Has been taking Torsemide  60 mg daily and Torsemide  80 mg daily and alternating every other day. Seems like it is not making a difference. Feels tired and washed out. Admits to poor sleep. Also admits to weight fluctuating. Notices some palpitations at times and numbness/tingling sensation along her left arm. Denies any chest pain, syncope, presyncope, dizziness, orthopnea, PND, acute bleeding, or claudication.  Studies Reviewed: SABRA       EKG : EKG is not ordered today.      Echo 03/2021:  1. Left ventricular ejection fraction, by estimation, is 50 to 55%. The  left ventricle has low normal function. The left ventricle has  no regional  wall motion abnormalities. Left ventricular diastolic parameters were  normal.   2. Right ventricular systolic function is normal. The right ventricular  size is normal. There is mildly elevated pulmonary artery systolic  pressure.   3. The mitral valve is abnormal. Mild mitral valve regurgitation. No  evidence of mitral stenosis.   4. The aortic valve is tricuspid. Aortic valve regurgitation is not  visualized. No aortic stenosis is present.   5. The inferior vena cava is normal in size with greater than 50%  respiratory variability, suggesting right atrial pressure of 3 mmHg.   Comparison(s): Previous Echo showed LVEF 40-45%, diffuse HK, mild MR with  posterior jet.  Physical Exam:   VS:  BP 116/82 (BP Location: Right Arm, Cuff Size: Large)   Pulse 66   Ht 5' 5 (1.651 m)   Wt 208 lb (94.3 kg)   SpO2 94%   BMI 34.61 kg/m    Wt Readings from Last 3 Encounters:  10/26/23 208 lb (94.3 kg)  10/08/23 205 lb (93 kg)  07/17/23 204 lb 9.6 oz (92.8 kg)    GEN: Obese, 63 y.o. female in no acute distress NECK: No JVD; No carotid bruits CARDIAC: S1/S2, RRR, no murmurs, rubs, gallops RESPIRATORY:  Clear to auscultation without rales, wheezing or rhonchi  ABDOMEN: Soft, non-tender, non-distended EXTREMITIES:  No edema; No deformity   ASSESSMENT AND PLAN: .    PAF/ Atrial tachycardia/ CHB, s/p AV junction ablation, s/p PPM (CRT Medtronic), palpitations Does admit to some palpitations at  times, feels fatigued. HR well controlled today. Will contact EP regarding most recent remote device reading. Continue Coreg .  CHA2DS2-VASc score is 2 and did discuss oral anticoagulation for stroke prevention.  She is wanting to talk to her insurance company and decide whether to begin Eliquis  versus Coumadin.  She will let us  know.  Normal device function seen on remote device check 08/2022.  Continue follow-up with Electrophysiologist.   HFpEF, NICM, DOE, medication management Stage C, NYHA  class I-II symptoms. EF 50-55%.  Weight is actually down 40 pounds since last office visit, but does admit to weight fluctuating recently.  Difficult to ascertain volume status.   She does tell me she is unable to afford Entresto  at this time.  Did instruct her to finish her current prescription of Entresto , then will begin losartan  25 mg daily.  Will increase torsemide  to 80 mg daily as she has self increased her diuretic to 60 mg and 80 mg alternating days.  Will obtain proBNP, CMET, and magnesium in 1 to 2 weeks.  Continue rest of medication regimen.  Low sodium diet, fluid restriction <2L, and daily weights encouraged. Educated to contact our office for weight gain of 2 lbs overnight or 5 lbs in one week. Will update Echo at this time.   Hx of syncope Denies any recent episodes. No medication changes at this time besides what is noted above. Discussed to monitor BP at home at least 2 hours after medications and sitting for 5-10 minutes. Care and ED precautions discussed.  MR Mild mitral valve regurgitation noted on TTE 03/2021. This improved from previous study. Does admit to DOE and fatigue - will update Echo.   5. Obesity Weight loss via diet and exercise encouraged. Discussed the impact being overweight would have on cardiovascular risk.   6. Hyperlipidemia Admits to statin intolerance - PCP has been managing. Did discuss lipid clinic referral and she is agreeable - will arrange.   7. Fatigue Etiology multifactorial. Does admit to poor sleep. Obtaining labs as mentioned above. Continue to follow with PCP.  8. Numbness/tingling Etiology unclear. Obtaining labs and recommended to f/u with PCP to r/o any non-cardiac causes.   Dispo: Follow-up in 1 year with Dr. Dorn Ross or APP or sooner if anything changes.   Signed, Almarie Crate, NP

## 2023-10-29 ENCOUNTER — Telehealth: Payer: Self-pay | Admitting: Nurse Practitioner

## 2023-10-29 ENCOUNTER — Telehealth: Payer: Self-pay | Admitting: *Deleted

## 2023-10-29 DIAGNOSIS — Z789 Other specified health status: Secondary | ICD-10-CM

## 2023-10-29 MED ORDER — APIXABAN 5 MG PO TABS: 5.0000 mg | ORAL_TABLET | Freq: Two times a day (BID) | ORAL | 1 refills | Status: DC

## 2023-10-29 NOTE — Telephone Encounter (Signed)
-----   Message from Almarie Crate sent at 10/29/2023 10:32 AM EDT ----- Can we please refer her to clinical pharm D for history of statin intolerance?   Thanks!   Best, Almarie Crate, NP

## 2023-10-29 NOTE — Telephone Encounter (Signed)
 Patient informed and verbalized understanding of plan. Referral placed.

## 2023-10-29 NOTE — Telephone Encounter (Signed)
 OK to send Eliquis  5mg  tablet Rx to pharmacy per FORBES Crate NP.

## 2023-10-29 NOTE — Telephone Encounter (Signed)
 Prescription refill request for Eliquis  received. Indication: PAF Last office visit: 10/26/23  FORBES Crate NP Scr: 0.96 on 10/08/23  Epic Age: 63 Weight: 94.3kg

## 2023-10-31 ENCOUNTER — Telehealth: Payer: Self-pay

## 2023-10-31 NOTE — Telephone Encounter (Signed)
-----   Message from Almarie Crate sent at 10/29/2023 10:34 AM EDT ----- Please provide most recent remote check on her device to evaluate her palpitations.   Thanks!   Best, Almarie Crate, NP

## 2023-11-01 NOTE — Telephone Encounter (Signed)
 Spoke to patient. States she is not home now but will send them she gets home. Pt states she knows how to send. Was appreciative of call.

## 2023-11-01 NOTE — Telephone Encounter (Signed)
 I think the losartan  that NP Miriam had suggested is the right replacement for entresto , does she need a prescription sent in or was one already sent during her recent appt   JINNY Ross MD

## 2023-11-02 NOTE — Telephone Encounter (Signed)
 Patient seen by Almarie Crate, NP on 10/26/23 - medication addressed at that visit.

## 2023-11-02 NOTE — Telephone Encounter (Signed)
 Remote transmission received. Normal device function.   AT/AF burden 100% w/ controlled ventricular rates since May 2025. Optivol reflects possible excess fluid onboard.   Routing to Almarie Crate, NP per request.

## 2023-11-06 ENCOUNTER — Ambulatory Visit: Payer: 59

## 2023-11-07 ENCOUNTER — Ambulatory Visit: Admitting: Pharmacist

## 2023-11-08 ENCOUNTER — Other Ambulatory Visit

## 2023-11-08 LAB — COMPREHENSIVE METABOLIC PANEL WITH GFR
ALT: 17 IU/L (ref 0–32)
AST: 24 IU/L (ref 0–40)
Albumin: 4.5 g/dL (ref 3.9–4.9)
Alkaline Phosphatase: 121 IU/L (ref 44–121)
BUN/Creatinine Ratio: 19 (ref 12–28)
BUN: 18 mg/dL (ref 8–27)
Bilirubin Total: 0.4 mg/dL (ref 0.0–1.2)
CO2: 27 mmol/L (ref 20–29)
Calcium: 9.2 mg/dL (ref 8.7–10.3)
Chloride: 98 mmol/L (ref 96–106)
Creatinine, Ser: 0.94 mg/dL (ref 0.57–1.00)
Globulin, Total: 2.2 g/dL (ref 1.5–4.5)
Glucose: 96 mg/dL (ref 70–99)
Potassium: 4.7 mmol/L (ref 3.5–5.2)
Sodium: 140 mmol/L (ref 134–144)
Total Protein: 6.7 g/dL (ref 6.0–8.5)
eGFR: 69 mL/min/1.73 (ref >59)

## 2023-11-08 LAB — BRAIN NATRIURETIC PEPTIDE: BNP: 155.6 pg/mL — ABNORMAL HIGH (ref 0.0–100.0)

## 2023-11-08 LAB — MAGNESIUM: Magnesium: 2.5 mg/dL — ABNORMAL HIGH (ref 1.6–2.3)

## 2023-11-09 ENCOUNTER — Other Ambulatory Visit: Payer: Self-pay | Admitting: Medical Genetics

## 2023-11-09 ENCOUNTER — Ambulatory Visit: Payer: Self-pay | Admitting: Nurse Practitioner

## 2023-11-09 DIAGNOSIS — I5042 Chronic combined systolic (congestive) and diastolic (congestive) heart failure: Secondary | ICD-10-CM

## 2023-11-09 DIAGNOSIS — Z79899 Other long term (current) drug therapy: Secondary | ICD-10-CM

## 2023-11-12 ENCOUNTER — Other Ambulatory Visit: Payer: Self-pay | Admitting: Cardiology

## 2023-11-13 ENCOUNTER — Ambulatory Visit (INDEPENDENT_AMBULATORY_CARE_PROVIDER_SITE_OTHER): Payer: 59

## 2023-11-13 DIAGNOSIS — I442 Atrioventricular block, complete: Secondary | ICD-10-CM | POA: Diagnosis not present

## 2023-11-13 LAB — CUP PACEART REMOTE DEVICE CHECK
Battery Remaining Longevity: 71 mo
Battery Voltage: 2.97 V
Brady Statistic RA Percent Paced: 0 %
Brady Statistic RV Percent Paced: 99.98 %
Date Time Interrogation Session: 20250805061324
Implantable Lead Connection Status: 753985
Implantable Lead Connection Status: 753985
Implantable Lead Connection Status: 753985
Implantable Lead Implant Date: 20100201
Implantable Lead Implant Date: 20100201
Implantable Lead Implant Date: 20220706
Implantable Lead Location: 753858
Implantable Lead Location: 753859
Implantable Lead Location: 753860
Implantable Lead Model: 4598
Implantable Lead Model: 5076
Implantable Lead Model: 5076
Implantable Pulse Generator Implant Date: 20220706
Lead Channel Impedance Value: 304 Ohm
Lead Channel Impedance Value: 323 Ohm
Lead Channel Impedance Value: 380 Ohm
Lead Channel Impedance Value: 418 Ohm
Lead Channel Impedance Value: 418 Ohm
Lead Channel Impedance Value: 437 Ohm
Lead Channel Impedance Value: 456 Ohm
Lead Channel Impedance Value: 475 Ohm
Lead Channel Impedance Value: 570 Ohm
Lead Channel Impedance Value: 703 Ohm
Lead Channel Impedance Value: 722 Ohm
Lead Channel Impedance Value: 741 Ohm
Lead Channel Impedance Value: 779 Ohm
Lead Channel Impedance Value: 817 Ohm
Lead Channel Pacing Threshold Amplitude: 1.5 V
Lead Channel Pacing Threshold Pulse Width: 0.4 ms
Lead Channel Sensing Intrinsic Amplitude: 2.25 mV
Lead Channel Sensing Intrinsic Amplitude: 2.25 mV
Lead Channel Sensing Intrinsic Amplitude: 3.625 mV
Lead Channel Setting Pacing Amplitude: 2 V
Lead Channel Setting Pacing Amplitude: 2 V
Lead Channel Setting Pacing Amplitude: 3 V
Lead Channel Setting Pacing Pulse Width: 0.4 ms
Lead Channel Setting Pacing Pulse Width: 0.6 ms
Lead Channel Setting Sensing Sensitivity: 0.9 mV
Zone Setting Status: 755011

## 2023-11-19 ENCOUNTER — Ambulatory Visit: Payer: Commercial Managed Care - HMO | Admitting: Physician Assistant

## 2023-11-20 ENCOUNTER — Ambulatory Visit: Payer: Self-pay | Admitting: Cardiovascular Disease

## 2023-11-22 ENCOUNTER — Encounter: Payer: Self-pay | Admitting: *Deleted

## 2023-11-26 ENCOUNTER — Inpatient Hospital Stay: Payer: Commercial Managed Care - HMO | Admitting: Physician Assistant

## 2023-11-29 ENCOUNTER — Ambulatory Visit: Attending: Nurse Practitioner

## 2023-11-29 DIAGNOSIS — R0609 Other forms of dyspnea: Secondary | ICD-10-CM | POA: Diagnosis not present

## 2023-11-29 DIAGNOSIS — R069 Unspecified abnormalities of breathing: Secondary | ICD-10-CM | POA: Diagnosis not present

## 2023-11-30 LAB — ECHOCARDIOGRAM COMPLETE
AR max vel: 3 cm2
AV Mean grad: 4.3 mmHg
AV Peak grad: 9.2 mmHg
Ao pk vel: 1.52 m/s
Calc EF: 55.3 %
MV M vel: 4.46 m/s
MV Peak grad: 79.6 mmHg
S' Lateral: 4.2 cm
Single Plane A2C EF: 55.5 %
Single Plane A4C EF: 52.8 %

## 2023-12-07 ENCOUNTER — Encounter: Payer: Self-pay | Admitting: Nurse Practitioner

## 2023-12-07 ENCOUNTER — Ambulatory Visit: Attending: Nurse Practitioner | Admitting: Nurse Practitioner

## 2023-12-07 VITALS — BP 118/70 | HR 76 | Ht 65.0 in | Wt 201.8 lb

## 2023-12-07 DIAGNOSIS — I340 Nonrheumatic mitral (valve) insufficiency: Secondary | ICD-10-CM

## 2023-12-07 DIAGNOSIS — Z87898 Personal history of other specified conditions: Secondary | ICD-10-CM

## 2023-12-07 DIAGNOSIS — I480 Paroxysmal atrial fibrillation: Secondary | ICD-10-CM | POA: Diagnosis not present

## 2023-12-07 DIAGNOSIS — R0609 Other forms of dyspnea: Secondary | ICD-10-CM | POA: Diagnosis not present

## 2023-12-07 DIAGNOSIS — I272 Pulmonary hypertension, unspecified: Secondary | ICD-10-CM

## 2023-12-07 DIAGNOSIS — E669 Obesity, unspecified: Secondary | ICD-10-CM

## 2023-12-07 DIAGNOSIS — I5032 Chronic diastolic (congestive) heart failure: Secondary | ICD-10-CM

## 2023-12-07 DIAGNOSIS — Z950 Presence of cardiac pacemaker: Secondary | ICD-10-CM

## 2023-12-07 DIAGNOSIS — E785 Hyperlipidemia, unspecified: Secondary | ICD-10-CM

## 2023-12-07 DIAGNOSIS — I4891 Unspecified atrial fibrillation: Secondary | ICD-10-CM

## 2023-12-07 DIAGNOSIS — I890 Lymphedema, not elsewhere classified: Secondary | ICD-10-CM

## 2023-12-07 NOTE — Patient Instructions (Addendum)

## 2023-12-07 NOTE — Progress Notes (Addendum)
 Cardiology Office Note:  .   Date: 12/07/2023 ID:  [name] [name], DOB [birth year], MRN [id number] PCP: Bluford Jacqulyn MATSU, DO  [facility] Providers Cardiologist:  Alvan Carrier, MD Electrophysiologist:  Elspeth Sage, MD {  History of Present Illness: .   [name] [name] is a 63 y.o. female with a PMH of HFpEF, tachycardia induced CM/HFpEF, PAF, hx of syncope, CHB, s/p AV junction ablation, hx of PPM, MR, and anemia who presents today for 1 year follow-up.   Last seen by Dr. Carrier Alvan 1 year ago. Was doing well at the time.   10/27/2022 - Today she presents for 1 year follow-up. States in June 2024 she called into office noting shortness of breath and stated she noted palpitations. Took an extra tablet of 20 mg of Torsemide  for 3-4 days and symptoms were resolved. Says she has been doing well recently. Denies any chest pain, shortness of breath, palpitations, syncope, presyncope, dizziness, orthopnea, PND, swelling or significant weight changes, acute bleeding, or claudication. Has lost around 50 lbs. Has noticed her SBP's have been in upper 90's recently, asymptomatic with these readings.   10/26/2023 - Here for follow-up. Hasn't been doing well recently. For the past month, she admits to feeling more short of breath and sensation of more fluid retention. Has been taking Torsemide  60 mg daily and Torsemide  80 mg daily and alternating every other day. Seems like it is not making a difference. Feels tired and washed out. Admits to poor sleep. Also admits to weight fluctuating. Notices some palpitations at times and numbness/tingling sensation along her left arm. Denies any chest pain, syncope, presyncope, dizziness, orthopnea, PND, acute bleeding, or claudication.  12/06/2023 - Doing well.  Weight is down 7 pounds since I last saw her in the office.  Denies any acute cardiac complaints or concerns.  She is requesting to receive electrophysiology care closer to home. Denies any chest  pain, shortness of breath, palpitations, syncope, presyncope, dizziness, orthopnea, PND, swelling or significant weight changes, acute bleeding, or claudication.  Studies Reviewed: SABRA       EKG : EKG is not ordered today.   Echo 11/2023:  1. Left ventricular ejection fraction, by estimation, is 50 to 55%. The  left ventricle has low normal function. The left ventricle has no regional  wall motion abnormalities. Left ventricular diastolic parameters are  indeterminate. The average left  ventricular global longitudinal strain is -20.8 %. The global longitudinal  strain is normal.   2. Right ventricular systolic function is normal. The right ventricular  size is normal. There is moderately elevated pulmonary artery systolic  pressure.   3. The mitral valve is abnormal. Mild mitral valve regurgitation. No  evidence of mitral stenosis.   4. The tricuspid valve is abnormal.   5. The aortic valve is tricuspid. Aortic valve regurgitation is not  visualized. No aortic stenosis is present.   6. The inferior vena cava is normal in size with greater than 50%  respiratory variability, suggesting right atrial pressure of 3 mmHg.   Comparison(s): A prior study was performed on 04/07/2021. EF 50-55%. Mild  MR.     Echo 03/2021:  1. Left ventricular ejection fraction, by estimation, is 50 to 55%. The  left ventricle has low normal function. The left ventricle has no regional  wall motion abnormalities. Left ventricular diastolic parameters were  normal.   2. Right ventricular systolic function is normal. The right ventricular  size is normal. There is mildly elevated pulmonary  artery systolic  pressure.   3. The mitral valve is abnormal. Mild mitral valve regurgitation. No  evidence of mitral stenosis.   4. The aortic valve is tricuspid. Aortic valve regurgitation is not  visualized. No aortic stenosis is present.   5. The inferior vena cava is normal in size with greater than 50%  respiratory  variability, suggesting right atrial pressure of 3 mmHg.   Comparison(s): Previous Echo showed LVEF 40-45%, diffuse HK, mild MR with  posterior jet.  Physical Exam:   VS:  BP 118/70   Pulse 76   Ht 5' 5 (1.651 m)   Wt 201 lb 12.8 oz (91.5 kg)   SpO2 98%   BMI 33.58 kg/m    Wt Readings from Last 3 Encounters:  12/07/23 201 lb 12.8 oz (91.5 kg)  10/26/23 208 lb (94.3 kg)  10/08/23 205 lb (93 kg)    GEN: Obese, 63 y.o. female in no acute distress NECK: No JVD; No carotid bruits CARDIAC: S1/S2, RRR, no murmurs, rubs, gallops RESPIRATORY:  Clear to auscultation without rales, wheezing or rhonchi  ABDOMEN: Soft, non-tender, non-distended EXTREMITIES:  No edema, some generalized lymphedema; No deformity   ASSESSMENT AND PLAN: .    PAF/ Atrial tachycardia/ CHB, s/p AV junction ablation, s/p PPM (CRT Medtronic) Denies any recent tachycardia or palpitations.  HR well controlled today. Continue Coreg . Continue Eliquis  for stroke prevention and denies any bleeding issues.  Most recent remote device check showed no clinically significant arrhythmia noted.  Will place referral for her to receive EP care closer to home.  HFpEF, NICM, DOE, pulmonary hypertension Stage C, NYHA class I-II symptoms. EF 50-55%.  Moderately elevated PASP on recent echo. WHO group 2/3.  Did discuss possible sleep study evaluation-patient declines at this time.  Weight is down and appears euvolemic on exam.  GDMT limited due to cost of medication-could not afford Entresto .   Continue current medication regimen.  Low sodium diet, fluid restriction <2L, and daily weights encouraged. Educated to contact our office for weight gain of 2 lbs overnight or 5 lbs in one week.    Hx of syncope Denies any recent episodes. No medication changes at this time. Discussed to monitor BP at home at least 2 hours after medications and sitting for 5-10 minutes. Care and ED precautions discussed.  MR Mild mitral valve regurgitation noted  on recent Echo.  Plan to update echocardiogram within the next 3 to 5 years or sooner clinically indicated.  5. Obesity Weight loss via diet and exercise encouraged. Discussed the impact being overweight would have on cardiovascular risk.   6. Hyperlipidemia Admits to statin intolerance - PCP has been managing.  Previously did arrange lipid clinic referral-appears patient has declined this.  Continue follow-up with PCP who is managing this.  7. Lymphedema Stable/improved per her report. Care precautions discussed. If no improvement by next follow-up, will discuss lymphedema clinic.   I spent a total duration of 30 minutes reviewing prior notes, reviewing outside records including  labs, face-to-face counseling of medical condition, pathophysiology, evaluation, management, and documenting the findings in the note.    Dispo: Follow-up in 6 months with Dr. Dorn Ross or APP or sooner if anything changes.   Signed, Almarie Crate, NP

## 2023-12-21 ENCOUNTER — Ambulatory Visit: Admitting: Cardiology

## 2023-12-27 ENCOUNTER — Other Ambulatory Visit: Payer: Self-pay | Admitting: Nurse Practitioner

## 2023-12-27 NOTE — Telephone Encounter (Signed)
 Prescription refill request for Eliquis  received. Indication:afib Last office visit:8/25 Scr:0.94  7/25 Age: 63 Weight:91.5  kg  Prescription refilled

## 2023-12-29 ENCOUNTER — Other Ambulatory Visit: Payer: Self-pay | Admitting: Family Medicine

## 2024-01-07 NOTE — Progress Notes (Signed)
 Remote PPM Transmission

## 2024-01-16 NOTE — Progress Notes (Unsigned)
 Electrophysiology Office Note:    Date:  01/17/2024   ID:  [name] [name], DOB 1961/03/19, MRN [id number]  PCP:  Bluford Jacqulyn MATSU, DO   [facility] Providers Cardiologist:  Alvan Carrier, MD Electrophysiologist:  Elspeth Sage, MD     Referring MD: Bluford Jacqulyn MATSU, DO   History of Present Illness:    [name] [name] is a 63 y.o. female with a medical history significant for atrial fibrillation, AV junction ablation in 1995, Medtronic CRT pacemaker, heart failure preserved ejection fraction, syncope, lymphedema referred for device and arrhythmia management.      Discussed the use of AI scribe software for clinical note transcription with the patient, who gave verbal consent to proceed.  History of Present Illness  She has a history of atrial fibrillation, managed with AVJ ablation and pacemaker placement in 1995.  The device was extracted in 2010 due to infection and reimplant on the right side.  The device was upgraded to a CRT in June 2022 due possibly due to pacing induced cardiomyopathy.  EF has recovered.    She was under the care of Dr. Sage.  Anticoagulation was deferred due to long-term use of Voltaren.  The patient, according to Dr. Celine notes, underwent cardioversion which converted her from atrial fibrillation to atrial tachycardia without significant symptom relief.  She has had chronic CHF symptoms with OptiVol level elevation and has been managed with torsemide .  Some of this is attributed to lymphedema.        Today, she reports that she feels at baseline.  EKGs/Labs/Other Studies Reviewed Today:     Echocardiogram:  TTE November 30, 2023 LVEF 50 to 55%.  Moderately elevated pulmonary artery systolic pressure.  Normal valvular structure and function.     EKG:   EKG Interpretation Date/Time:  Thursday January 17 2024 10:41:44 EDT Ventricular Rate:  67 PR Interval:    QRS Duration:  126 QT Interval:  474 QTC Calculation: 500 R  Axis:   247  Text Interpretation: Atrial fibrillation When compared with ECG of 17-Jul-2023 08:07, atrial fibrillation has replaced atrial tachycardia Confirmed by Nancey Scotts [id number]) on 01/17/2024 10:48:59 AM     Physical Exam:    VS:  BP 90/62 (BP Location: Left Arm)   Pulse 67   Ht 5' 5 (1.651 m)   Wt 201 lb 9.6 oz (91.4 kg)   SpO2 97%   BMI 33.55 kg/m     Wt Readings from Last 3 Encounters:  01/17/24 201 lb 9.6 oz (91.4 kg)  12/07/23 201 lb 12.8 oz (91.5 kg)  10/26/23 208 lb (94.3 kg)     GEN: Well nourished, well developed in no acute distress CARDIAC: RRR, no murmurs, rubs, gallops The device site is normal -- no tenderness, edema, drainage, redness, threatened erosion.  RESPIRATORY:  Normal work of breathing MUSCULOSKELETAL: no edema    ASSESSMENT & PLAN:     Atrial tachycardia and Paroxysmal atrial fibrillation She has had 2 attempted ablations of atrial tachycardia --at [facility] by Dr. Sage, and at [facility].  These occurred in the 1990s Status post AVJ ablation with pacemaker Fibrillation as a relatively new diagnosis I am not sure why her prior ablations were unsuccessful, but I think it worthwhile to map and attempt ablation of her tachycardia now that technology is significantly better. I think what approach the procedure differently depending upon whether she is in fibrillation or atrial tachycardia.  If she arrives in atrial tachycardia, I would proceed with conscious sedation  to map and attempt to ablate the tachycardia.  If she arrives in atrial fibrillation, I would go ahead and put her under general anesthesia and perform a cardioversion hoping she will return to atrial tachycardia which we would then map and ablate. She will need a CT prior to the ablation We will need to use Carto I anticipate that we will also perform an atrial fibrillation ablation  We discussed the indication, rationale, logistics, anticipated benefits, and potential risks of the  ablation procedure including but not limited to -- bleed at the groin access site, chest pain, damage to nearby organs such as the diaphragm, lungs, or esophagus, need for a drainage tube, or prolonged hospitalization. I explained that the risk for stroke, heart attack, need for open chest surgery, or even death is very low but not zero. she  expressed understanding and wishes to proceed.   Secondary hypercoagulable state CHA2DS2-VASc score is 2 Continue Eliquis  5 mg twice daily  Complete heart block Iatrogenic (1995) for rate control of atrial fibrillation Medtronic CRT-P in place  CHF with recovered ejection fraction, nonischemic cardiomyopathy Appears compensated and euvolemic     Signed, Eulas FORBES Furbish, MD  01/17/2024 10:49 AM    [facility]

## 2024-01-17 ENCOUNTER — Ambulatory Visit: Attending: Cardiovascular Disease | Admitting: Cardiovascular Disease

## 2024-01-17 ENCOUNTER — Encounter: Payer: Self-pay | Admitting: Cardiovascular Disease

## 2024-01-17 VITALS — BP 90/62 | HR 67 | Ht 65.0 in | Wt 201.6 lb

## 2024-01-17 DIAGNOSIS — I480 Paroxysmal atrial fibrillation: Secondary | ICD-10-CM | POA: Diagnosis not present

## 2024-01-17 NOTE — Patient Instructions (Signed)
 Medication Instructions:   Continue all current medications.   Labwork:  none  Testing/Procedures:  Your physician has recommended that you have an ablation. Catheter ablation is a medical procedure used to treat some cardiac arrhythmias (irregular heartbeats). During catheter ablation, a long, thin, flexible tube is put into a blood vessel in your groin (upper thigh), or neck. This tube is called an ablation catheter. It is then guided to your heart through the blood vessel. Radio frequency waves destroy small areas of heart tissue where abnormal heartbeats may cause an arrhythmia to start. Please see the instruction sheet given to you today.   Follow-Up:  Pending   Any Other Special Instructions Will Be Listed Below (If Applicable).   If you need a refill on your cardiac medications before your next appointment, please call your pharmacy.

## 2024-02-01 ENCOUNTER — Other Ambulatory Visit: Payer: Self-pay | Admitting: Medical Genetics

## 2024-02-01 DIAGNOSIS — Z006 Encounter for examination for normal comparison and control in clinical research program: Secondary | ICD-10-CM

## 2024-02-05 ENCOUNTER — Ambulatory Visit: Payer: 59

## 2024-02-07 ENCOUNTER — Telehealth: Payer: Self-pay

## 2024-02-07 DIAGNOSIS — I480 Paroxysmal atrial fibrillation: Secondary | ICD-10-CM

## 2024-02-07 NOTE — Addendum Note (Signed)
 Addended by: Quency Tober on: 02/07/2024 03:40 PM   Modules accepted: Orders

## 2024-02-07 NOTE — Telephone Encounter (Signed)
 Called and scheduled pt for Afib Ablation with Dr. Nancey on 12/17 at 12:30 pm.  CT is scheduled on 11/20 at 11:00 am at [facility]. She will get labs done same day as CT.   Instruction letters will be sent via MyChart per pt's request.

## 2024-02-12 ENCOUNTER — Ambulatory Visit: Payer: 59

## 2024-02-12 DIAGNOSIS — I480 Paroxysmal atrial fibrillation: Secondary | ICD-10-CM | POA: Diagnosis not present

## 2024-02-13 LAB — CUP PACEART REMOTE DEVICE CHECK
Battery Remaining Longevity: 65 mo
Battery Voltage: 2.96 V
Brady Statistic RA Percent Paced: 0 %
Brady Statistic RV Percent Paced: 99.97 %
Date Time Interrogation Session: 20251104191420
Implantable Lead Connection Status: 753985
Implantable Lead Connection Status: 753985
Implantable Lead Connection Status: 753985
Implantable Lead Implant Date: 20100201
Implantable Lead Implant Date: 20100201
Implantable Lead Implant Date: 20220706
Implantable Lead Location: 753858
Implantable Lead Location: 753859
Implantable Lead Location: 753860
Implantable Lead Model: 4598
Implantable Lead Model: 5076
Implantable Lead Model: 5076
Implantable Pulse Generator Implant Date: 20220706
Lead Channel Impedance Value: 323 Ohm
Lead Channel Impedance Value: 323 Ohm
Lead Channel Impedance Value: 418 Ohm
Lead Channel Impedance Value: 418 Ohm
Lead Channel Impedance Value: 437 Ohm
Lead Channel Impedance Value: 494 Ohm
Lead Channel Impedance Value: 494 Ohm
Lead Channel Impedance Value: 513 Ohm
Lead Channel Impedance Value: 627 Ohm
Lead Channel Impedance Value: 760 Ohm
Lead Channel Impedance Value: 798 Ohm
Lead Channel Impedance Value: 798 Ohm
Lead Channel Impedance Value: 855 Ohm
Lead Channel Impedance Value: 874 Ohm
Lead Channel Pacing Threshold Amplitude: 1.5 V
Lead Channel Pacing Threshold Pulse Width: 0.4 ms
Lead Channel Sensing Intrinsic Amplitude: 0.875 mV
Lead Channel Sensing Intrinsic Amplitude: 0.875 mV
Lead Channel Sensing Intrinsic Amplitude: 3.625 mV
Lead Channel Setting Pacing Amplitude: 2 V
Lead Channel Setting Pacing Amplitude: 2 V
Lead Channel Setting Pacing Amplitude: 3.25 V
Lead Channel Setting Pacing Pulse Width: 0.4 ms
Lead Channel Setting Pacing Pulse Width: 0.6 ms
Lead Channel Setting Sensing Sensitivity: 0.9 mV
Zone Setting Status: 755011

## 2024-02-14 ENCOUNTER — Ambulatory Visit: Payer: Self-pay | Admitting: Cardiovascular Disease

## 2024-02-18 NOTE — Progress Notes (Signed)
 Remote PPM Transmission

## 2024-02-21 ENCOUNTER — Other Ambulatory Visit: Payer: Self-pay

## 2024-02-22 MED ORDER — EMPAGLIFLOZIN 10 MG PO TABS: 10.0000 mg | ORAL_TABLET | Freq: Every day | ORAL | 3 refills | Status: AC

## 2024-02-28 ENCOUNTER — Ambulatory Visit (HOSPITAL_COMMUNITY)
Admission: RE | Admit: 2024-02-28 | Discharge: 2024-02-28 | Disposition: A | Discharge status: Home / Self Care | Source: Ambulatory Visit | Attending: Student in an Organized Health Care Education/Training Program | Admitting: Student in an Organized Health Care Education/Training Program

## 2024-02-28 ENCOUNTER — Other Ambulatory Visit: Payer: Self-pay

## 2024-02-28 DIAGNOSIS — I480 Paroxysmal atrial fibrillation: Secondary | ICD-10-CM | POA: Diagnosis present

## 2024-02-28 LAB — CBC
Hematocrit: 40.8 % (ref 34.0–46.6)
Hemoglobin: 13.3 g/dL (ref 11.1–15.9)
MCH: 31.6 pg (ref 26.6–33.0)
MCHC: 32.6 g/dL (ref 31.5–35.7)
MCV: 97 fL (ref 79–97)
Platelets: 180 x10E3/uL (ref 150–450)
RBC: 4.21 x10E6/uL (ref 3.77–5.28)
RDW: 13.4 % (ref 11.7–15.4)
WBC: 5.3 x10E3/uL (ref 3.4–10.8)

## 2024-02-28 MED ORDER — IOHEXOL 350 MG/ML SOLN
80.0000 mL | Freq: Once | INTRAVENOUS | Status: AC | PRN
  Administered 2024-02-28: 80 mL via INTRAVENOUS

## 2024-02-29 LAB — BASIC METABOLIC PANEL WITH GFR
BUN/Creatinine Ratio: 16 (ref 12–28)
BUN: 14 mg/dL (ref 8–27)
CO2: 28 mmol/L (ref 20–29)
Calcium: 9.2 mg/dL (ref 8.7–10.3)
Chloride: 96 mmol/L (ref 96–106)
Creatinine, Ser: 0.87 mg/dL (ref 0.57–1.00)
Glucose: 95 mg/dL (ref 70–99)
Potassium: 4.6 mmol/L (ref 3.5–5.2)
Sodium: 134 mmol/L (ref 134–144)
eGFR: 75 mL/min/1.73 (ref >59)

## 2024-03-04 ENCOUNTER — Ambulatory Visit: Payer: Self-pay | Admitting: Cardiovascular Disease

## 2024-03-05 ENCOUNTER — Telehealth (HOSPITAL_COMMUNITY): Payer: Self-pay

## 2024-03-05 NOTE — Telephone Encounter (Signed)
 Attempted to reach patient to discuss upcoming procedure, no answer. Left VM for patient to return call.

## 2024-03-05 NOTE — Telephone Encounter (Signed)
 Spoke with patient to discuss upcoming procedure.   CT: completed.  Labs: completed.   Any recent signs of acute illness or been started on antibiotics? No Any new medications started?No Any medications to hold?  Yes  HOLD: Empagliflozin  (Jardiance ) for 3 days prior to the procedure. Last dose on Saturday, December 13. Any missed doses of blood thinner?  No Advised patient to continue taking Eliquis  (Apixaban ) twice daily without missing any doses.  Medication instructions:  On the morning of your procedure DO NOT take any medication., including Eliquis  (Apixaban ) or the procedure may be rescheduled. Nothing to eat or drink after midnight prior to your procedure.  Confirmed patient is scheduled for Atrial Fibrillation Ablation on Wednesday, December 17 with Dr. Nancey. Instructed patient to arrive at the Main Entrance A at [facility]: [address] [place], [place] [address] and check in at Admitting at 10:30 AM.   Plan to go home the same day, you will only stay overnight if medically necessary. You MUST have a responsible adult to drive you home and MUST be with you the first 24 hours after you arrive home or your procedure could be cancelled.  Informed patient a nurse will call a day before the procedure to confirm arrival time and ensure instructions are followed.  Patient verbalized understanding to all instructions provided and agreed to proceed with procedure.   Advised patient to contact RN Navigator at [phone], to inform of any new medications started after call or concerns prior to procedure.

## 2024-03-17 ENCOUNTER — Other Ambulatory Visit: Payer: Self-pay | Admitting: Family Medicine

## 2024-03-24 NOTE — Telephone Encounter (Signed)
 I called patient and informed her of the change of time for her Afib Ablation on 12/17 - she will arrive at 630 for an 830 am procedure time.

## 2024-03-25 NOTE — Pre-Procedure Instructions (Signed)
 Instructed patient on the following items: Arrival time 0615 Nothing to eat or drink after midnight No meds AM of procedure Responsible person to drive you home and stay with you for 24 hrs  Have you missed any doses of anti-coagulant Eliquis - takes twice a day, hasn't missed any doses in last 4 weeks.  Don't take dose morning of procedure.

## 2024-03-26 ENCOUNTER — Ambulatory Visit (HOSPITAL_BASED_OUTPATIENT_CLINIC_OR_DEPARTMENT_OTHER): Payer: Self-pay

## 2024-03-26 ENCOUNTER — Ambulatory Visit (HOSPITAL_COMMUNITY)
Admission: RE | Admit: 2024-03-26 | Discharge: 2024-03-26 | Disposition: A | Attending: Cardiovascular Disease | Admitting: Cardiovascular Disease

## 2024-03-26 ENCOUNTER — Ambulatory Visit (HOSPITAL_COMMUNITY): Payer: Self-pay

## 2024-03-26 ENCOUNTER — Other Ambulatory Visit: Payer: Self-pay

## 2024-03-26 ENCOUNTER — Encounter (HOSPITAL_COMMUNITY): Admission: RE | Disposition: A | Payer: Self-pay | Source: Home / Self Care | Attending: Cardiovascular Disease

## 2024-03-26 DIAGNOSIS — I890 Lymphedema, not elsewhere classified: Secondary | ICD-10-CM | POA: Diagnosis not present

## 2024-03-26 DIAGNOSIS — Z7901 Long term (current) use of anticoagulants: Secondary | ICD-10-CM | POA: Insufficient documentation

## 2024-03-26 DIAGNOSIS — Z79899 Other long term (current) drug therapy: Secondary | ICD-10-CM | POA: Insufficient documentation

## 2024-03-26 DIAGNOSIS — I5042 Chronic combined systolic (congestive) and diastolic (congestive) heart failure: Secondary | ICD-10-CM | POA: Diagnosis not present

## 2024-03-26 DIAGNOSIS — I5032 Chronic diastolic (congestive) heart failure: Secondary | ICD-10-CM | POA: Insufficient documentation

## 2024-03-26 DIAGNOSIS — I442 Atrioventricular block, complete: Secondary | ICD-10-CM | POA: Diagnosis not present

## 2024-03-26 DIAGNOSIS — D6869 Other thrombophilia: Secondary | ICD-10-CM | POA: Diagnosis not present

## 2024-03-26 DIAGNOSIS — I428 Other cardiomyopathies: Secondary | ICD-10-CM | POA: Insufficient documentation

## 2024-03-26 DIAGNOSIS — I480 Paroxysmal atrial fibrillation: Secondary | ICD-10-CM

## 2024-03-26 DIAGNOSIS — Z950 Presence of cardiac pacemaker: Secondary | ICD-10-CM | POA: Insufficient documentation

## 2024-03-26 DIAGNOSIS — I4719 Other supraventricular tachycardia: Secondary | ICD-10-CM | POA: Diagnosis present

## 2024-03-26 DIAGNOSIS — I4891 Unspecified atrial fibrillation: Secondary | ICD-10-CM | POA: Diagnosis not present

## 2024-03-26 HISTORY — PX: ATRIAL FIBRILLATION ABLATION: EP1191

## 2024-03-26 LAB — POCT ACTIVATED CLOTTING TIME: Activated Clotting Time: 271 s

## 2024-03-26 SURGERY — ATRIAL FIBRILLATION ABLATION
Anesthesia: General

## 2024-03-26 MED ORDER — PHENYLEPHRINE HCL-NACL 20-0.9 MG/250ML-% IV SOLN
INTRAVENOUS | Status: DC | PRN
  Administered 2024-03-26: 09:00:00 30 ug/min via INTRAVENOUS

## 2024-03-26 MED ORDER — CEFAZOLIN SODIUM-DEXTROSE 2-3 GM-%(50ML) IV SOLR
2.0000 g | Freq: Once | INTRAVENOUS | Status: AC
  Administered 2024-03-26: 09:00:00 2 g via INTRAVENOUS

## 2024-03-26 MED ORDER — CEFAZOLIN SODIUM-DEXTROSE 2-4 GM/100ML-% IV SOLN
INTRAVENOUS | Status: AC
  Filled 2024-03-26: qty 100

## 2024-03-26 MED ORDER — ONDANSETRON HCL 4 MG/2ML IJ SOLN: 4.0000 mg | Freq: Four times a day (QID) | INTRAMUSCULAR | Status: DC | PRN

## 2024-03-26 MED ORDER — HEPARIN (PORCINE) IN NACL 1000-0.9 UT/500ML-% IV SOLN
INTRAVENOUS | Status: DC | PRN
  Administered 2024-03-26 (×3): 500 mL

## 2024-03-26 MED ORDER — SUGAMMADEX SODIUM 200 MG/2ML IV SOLN
INTRAVENOUS | Status: DC | PRN
  Administered 2024-03-26: 10:00:00 200 mg via INTRAVENOUS

## 2024-03-26 MED ORDER — SODIUM CHLORIDE 0.9 % IV SOLN: 250.0000 mL | INTRAVENOUS | Status: DC | PRN

## 2024-03-26 MED ORDER — PROPOFOL 10 MG/ML IV BOLUS
INTRAVENOUS | Status: DC | PRN
  Administered 2024-03-26: 09:00:00 140 mg via INTRAVENOUS
  Administered 2024-03-26: 08:00:00 150 mg via INTRAVENOUS

## 2024-03-26 MED ORDER — MIDAZOLAM HCL (PF) 2 MG/2ML IJ SOLN
INTRAMUSCULAR | Status: DC | PRN
  Administered 2024-03-26: 08:00:00 2 mg via INTRAVENOUS

## 2024-03-26 MED ORDER — ONDANSETRON HCL 4 MG/2ML IJ SOLN
INTRAMUSCULAR | Status: DC | PRN
  Administered 2024-03-26: 09:00:00 4 mg via INTRAVENOUS

## 2024-03-26 MED ORDER — SODIUM CHLORIDE 0.9 % IV SOLN: INTRAVENOUS | Status: DC

## 2024-03-26 MED ORDER — ACETAMINOPHEN 325 MG PO TABS: 650.0000 mg | ORAL_TABLET | ORAL | Status: DC | PRN

## 2024-03-26 MED ORDER — LIDOCAINE 2% (20 MG/ML) 5 ML SYRINGE
INTRAMUSCULAR | Status: DC | PRN
  Administered 2024-03-26: 08:00:00 60 mg via INTRAVENOUS

## 2024-03-26 MED ORDER — DEXAMETHASONE SOD PHOSPHATE PF 10 MG/ML IJ SOLN
INTRAMUSCULAR | Status: DC | PRN
  Administered 2024-03-26: 09:00:00 5 mg via INTRAVENOUS

## 2024-03-26 MED ORDER — ATROPINE SULFATE 1 MG/10ML IJ SOSY
PREFILLED_SYRINGE | INTRAMUSCULAR | Status: DC | PRN
  Administered 2024-03-26: 09:00:00 1 mg via INTRAVENOUS

## 2024-03-26 MED ORDER — PROTAMINE SULFATE 10 MG/ML IV SOLN
INTRAVENOUS | Status: DC | PRN
  Administered 2024-03-26: 10:00:00 50 mg via INTRAVENOUS

## 2024-03-26 MED ORDER — SODIUM CHLORIDE 0.9% FLUSH: 3.0000 mL | INTRAVENOUS | Status: DC | PRN

## 2024-03-26 MED ORDER — ROCURONIUM BROMIDE 10 MG/ML (PF) SYRINGE
PREFILLED_SYRINGE | INTRAVENOUS | Status: DC | PRN
  Administered 2024-03-26: 09:00:00 50 mg via INTRAVENOUS
  Administered 2024-03-26: 09:00:00 10 mg via INTRAVENOUS
  Administered 2024-03-26: 09:00:00 15 mg via INTRAVENOUS

## 2024-03-26 MED ORDER — HEPARIN SODIUM (PORCINE) 1000 UNIT/ML IJ SOLN
INTRAMUSCULAR | Status: DC | PRN
  Administered 2024-03-26: 09:00:00 16000 [IU] via INTRAVENOUS
  Administered 2024-03-26: 10:00:00 4000 [IU] via INTRAVENOUS

## 2024-03-26 MED ORDER — FENTANYL CITRATE (PF) 250 MCG/5ML IJ SOLN
INTRAMUSCULAR | Status: DC | PRN
  Administered 2024-03-26: 08:00:00 100 ug via INTRAVENOUS

## 2024-03-26 MED FILL — Atropine Sulfate Soln Prefill Syr 1 MG/10ML (0.1 MG/ML): INTRAMUSCULAR | Qty: 20 | Status: AC

## 2024-03-26 MED FILL — Heparin Sodium (Porcine) Inj 1000 Unit/ML: INTRAMUSCULAR | Qty: 40 | Status: AC

## 2024-03-26 MED FILL — Midazolam HCl Inj 2 MG/2ML (Base Equivalent): INTRAMUSCULAR | Qty: 2 | Status: AC

## 2024-03-26 MED FILL — Fentanyl Citrate Preservative Free (PF) Inj 100 MCG/2ML: INTRAMUSCULAR | Qty: 2 | Status: AC

## 2024-03-26 SURGICAL SUPPLY — 20 items
BLANKET WARM UNDERBOD FULL ACC (MISCELLANEOUS) ×1 IMPLANT
CABLE FARASTAR GEN2 SNGL USE (CABLE) IMPLANT
CATH FARAWAVE 2.0 31 (CATHETERS) IMPLANT
CATH GE 8FR SOUNDSTAR (CATHETERS) IMPLANT
CATH OCTARAY 2.0 F 3-3-3-3-3 (CATHETERS) IMPLANT
CATH WEBSTER BI DIR CS D-F CRV (CATHETERS) IMPLANT
CLOSURE PERCLOSE PROSTYLE (Vascular Products) IMPLANT
COVER SWIFTLINK CONNECTOR (BAG) ×1 IMPLANT
DEVICE CLOSURE MYNXGRIP 6/7F (Vascular Products) IMPLANT
DILATOR VESSEL 38 20CM 16FR (INTRODUCER) IMPLANT
GUIDEWIRE INQWIRE 1.5J.035X260 (WIRE) IMPLANT
KIT VERSACROSS CNCT FARADRIVE (KITS) IMPLANT
MAT PREVALON FULL STRYKER (MISCELLANEOUS) IMPLANT
PACK EP LF (CUSTOM PROCEDURE TRAY) ×1 IMPLANT
PAD DEFIB RADIO PHYSIO CONN (PAD) ×1 IMPLANT
PATCH CARTO3 (PAD) IMPLANT
SHEATH FARADRIVE STEERABLE (SHEATH) IMPLANT
SHEATH PINNACLE 8F 10CM (SHEATH) IMPLANT
SHEATH PINNACLE 9F 10CM (SHEATH) IMPLANT
SHEATH PROBE COVER 6X72 (BAG) IMPLANT

## 2024-03-26 NOTE — Discharge Instructions (Signed)

## 2024-03-26 NOTE — Anesthesia Preprocedure Evaluation (Signed)
 Anesthesia Evaluation  Patient identified by MRN, date of birth, ID band Patient awake    Reviewed: Allergy & Precautions, NPO status , Patient's Chart, lab work & pertinent test results  Airway Mallampati: II  TM Distance: >3 FB Neck ROM: Full    Dental no notable dental hx.    Pulmonary neg pulmonary ROS   Pulmonary exam normal        Cardiovascular +CHF  + dysrhythmias Atrial Fibrillation + pacemaker  Rhythm:Regular Rate:Normal  IMPRESSIONS    1. Left ventricular ejection fraction, by estimation, is 50 to 55%. The left ventricle has low normal function. The left ventricle has no regional wall motion abnormalities. Left ventricular diastolic parameters are indeterminate. The average left ventricular global longitudinal strain is -20.8 %. The global longitudinal strain is normal.  2. Right ventricular systolic function is normal. The right ventricular size is normal. There is moderately elevated pulmonary artery systolic pressure.  3. The mitral valve is abnormal. Mild mitral valve regurgitation. No evidence of mitral stenosis.  4. The tricuspid valve is abnormal.  5. The aortic valve is tricuspid. Aortic valve regurgitation is not visualized. No aortic stenosis is present.  6. The inferior vena cava is normal in size with greater than 50% respiratory variability, suggesting right atrial pressure of 3 mmHg.   Comparison(s): A prior study was performed on 04/07/2021. EF 50-55%. Mild M    Neuro/Psych    Depression    negative neurological ROS     GI/Hepatic Neg liver ROS,GERD  ,,  Endo/Other  negative endocrine ROS    Renal/GU negative Renal ROS  negative genitourinary   Musculoskeletal negative musculoskeletal ROS (+)    Abdominal Normal abdominal exam  (+)   Peds  Hematology  (+) Blood dyscrasia, anemia Lab Results      Component                Value               Date                      WBC                       5.3                 02/28/2024                HGB                      13.3                02/28/2024                HCT                      40.8                02/28/2024                MCV                      97                  02/28/2024                PLT  180                 02/28/2024             Lab Results      Component                Value               Date                      NA                       134                 02/28/2024                K                        4.6                 02/28/2024                CO2                      28                  02/28/2024                GLUCOSE                  95                  02/28/2024                BUN                      14                  02/28/2024                CREATININE               0.87                02/28/2024                CALCIUM                   9.2                 02/28/2024                EGFR                     75                  02/28/2024                GFRNONAA                 >60                 07/25/2021              Anesthesia Other Findings   Reproductive/Obstetrics  Anesthesia Physical Anesthesia Plan  ASA: 3  Anesthesia Plan: General   Post-op Pain Management:    Induction: Intravenous  PONV Risk Score and Plan: 3 and Ondansetron , Dexamethasone , Midazolam  and Treatment may vary due to age or medical condition  Airway Management Planned: Mask and Oral ETT  Additional Equipment: None  Intra-op Plan:   Post-operative Plan:   Informed Consent: I have reviewed the patients History and Physical, chart, labs and discussed the procedure including the risks, benefits and alternatives for the proposed anesthesia with the patient or authorized representative who has indicated his/her understanding and acceptance.     Dental advisory given  Plan Discussed with: CRNA  Anesthesia Plan Comments:          Anesthesia Quick Evaluation

## 2024-03-26 NOTE — H&P (Signed)
 Electrophysiology Office Note:    Date:  03/26/2024   ID:  [name] [name], DOB 15-Jan-1961, MRN [id number]  PCP:  Bluford Jacqulyn MATSU, DO   [facility] Providers Cardiologist:  Alvan Carrier, MD Electrophysiologist:  Eulas FORBES Furbish, MD     Referring MD: No ref. provider found   History of Present Illness:    [name] [name] is a 63 y.o. female with a medical history significant for atrial fibrillation, AV junction ablation in 1995, Medtronic CRT pacemaker, heart failure preserved ejection fraction, syncope, lymphedema referred for device and arrhythmia management.      Discussed the use of AI scribe software for clinical note transcription with the patient, who gave verbal consent to proceed.  History of Present Illness  She has a history of atrial fibrillation, managed with AVJ ablation and pacemaker placement in 1995.  The device was extracted in 2010 due to infection and reimplant on the right side.  The device was upgraded to a CRT in June 2022 due possibly due to pacing induced cardiomyopathy.  EF has recovered.    She was under the care of Dr. Fernande.  Anticoagulation was deferred due to long-term use of Voltaren.  The patient, according to Dr. Celine notes, underwent cardioversion which converted her from atrial fibrillation to atrial tachycardia without significant symptom relief.  She has had chronic CHF symptoms with OptiVol level elevation and has been managed with torsemide .  Some of this is attributed to lymphedema.        Today, she reports that she feels at baseline. I reviewed the patient's CT and labs. There was no LAA thrombus. she  has not missed any doses of anticoagulation, and she took her dose last night. There have been no changes in the patient's diagnoses, medications, or condition since our recent clinic visit.   EKGs/Labs/Other Studies Reviewed Today:     Echocardiogram:  TTE November 30, 2023 LVEF 50 to 55%.  Moderately elevated  pulmonary artery systolic pressure.  Normal valvular structure and function.     EKG:         Physical Exam:    VS:  BP (!) 96/48   Pulse 61   Temp 98.3 F (36.8 C)   Resp 16   Ht 5' 5 (1.651 m)   Wt 90.7 kg   SpO2 99%   BMI 33.28 kg/m     Wt Readings from Last 3 Encounters:  03/26/24 90.7 kg  01/17/24 91.4 kg  12/07/23 91.5 kg     GEN: Well nourished, well developed in no acute distress CARDIAC: RRR, no murmurs, rubs, gallops The device site is normal -- no tenderness, edema, drainage, redness, threatened erosion.  RESPIRATORY:  Normal work of breathing MUSCULOSKELETAL: no edema    ASSESSMENT & PLAN:     Atrial tachycardia and Paroxysmal atrial fibrillation She has had 2 attempted ablations of atrial tachycardia --at [facility] by Dr. Fernande, and at [facility].  These occurred in the 1990s Status post AVJ ablation with pacemaker Fibrillation as a relatively new diagnosis I am not sure why her prior ablations were unsuccessful, but I think it worthwhile to map and attempt ablation of her tachycardia now that technology is significantly better. I think what approach the procedure differently depending upon whether she is in fibrillation or atrial tachycardia.  If she arrives in atrial tachycardia, I would proceed with conscious sedation to map and attempt to ablate the tachycardia.  If she arrives in atrial fibrillation, I would go ahead  and put her under general anesthesia and perform a cardioversion hoping she will return to atrial tachycardia which we would then map and ablate. She will need a CT prior to the ablation We will need to use Carto I anticipate that we will also perform an atrial fibrillation ablation  We discussed the indication, rationale, logistics, anticipated benefits, and potential risks of the ablation procedure including but not limited to -- bleed at the groin access site, chest pain, damage to nearby organs such as the diaphragm, lungs, or esophagus, need  for a drainage tube, or prolonged hospitalization. I explained that the risk for stroke, heart attack, need for open chest surgery, or even death is very low but not zero. she  expressed understanding and wishes to proceed.   Secondary hypercoagulable state CHA2DS2-VASc score is 2 Continue Eliquis  5 mg twice daily  Complete heart block Iatrogenic (1995) for rate control of atrial fibrillation Medtronic CRT-P in place  CHF with recovered ejection fraction, nonischemic cardiomyopathy Appears compensated and euvolemic     Signed, Eulas FORBES Furbish, MD  03/26/2024 7:10 AM    [facility]

## 2024-03-26 NOTE — Transfer of Care (Signed)
 Immediate Anesthesia Transfer of Care Note  Patient: [name] [name]  Procedure(s) Performed: ATRIAL FIBRILLATION ABLATION  Patient Location: PACU; Cath lab  Anesthesia Type:General  Level of Consciousness: awake and alert   Airway & Oxygen Therapy: Patient Spontanous Breathing and Patient connected to face mask oxygen  Post-op Assessment: Report given to RN and Post -op Vital signs reviewed and stable  Post vital signs: Reviewed and stable  Last Vitals:  Vitals Value Taken Time  BP 110/58 03/26/24 10:11  Temp    Pulse 88 03/26/24 10:13  Resp 15 03/26/24 10:13  SpO2 94 % 03/26/24 10:13  Vitals shown include unfiled device data.  Last Pain:  Vitals:   03/26/24 0641  PainSc: 0-No pain         Complications: There were no known notable events for this encounter.

## 2024-03-26 NOTE — Anesthesia Postprocedure Evaluation (Signed)
 Anesthesia Post Note  Patient: [name] [name]  Procedure(s) Performed: ATRIAL FIBRILLATION ABLATION     Patient location during evaluation: PACU Anesthesia Type: General Level of consciousness: awake and alert Pain management: pain level controlled Vital Signs Assessment: post-procedure vital signs reviewed and stable Respiratory status: spontaneous breathing, nonlabored ventilation, respiratory function stable and patient connected to nasal cannula oxygen Cardiovascular status: blood pressure returned to baseline and stable Postop Assessment: no apparent nausea or vomiting Anesthetic complications: no   There were no known notable events for this encounter.  Last Vitals:  Vitals:   03/26/24 1200 03/26/24 1230  BP: 111/61 108/63  Pulse: 71 71  Resp: 16 18  Temp:    SpO2: 99% 97%    Last Pain:  Vitals:   03/26/24 1230  TempSrc:   PainSc: 0-No pain                 Cordella P Lilianne Delair

## 2024-03-26 NOTE — Progress Notes (Signed)
 Discharge instructions reviewed with patient and spouse at bedside. Denies questions concerns. PT tolerated PO intake. Ambulated in the hallway, was able to void without difficulty. Seen by MD. Incision site remains clean dry and intact. No s/s of complications. PT escorted from the unit via wheel chair to personal vehicle.

## 2024-03-26 NOTE — Anesthesia Procedure Notes (Signed)
 Procedure Name: Intubation Date/Time: 03/26/2024 8:42 AM  Performed by: Roslynn Waddell LABOR, CRNAPre-anesthesia Checklist: Patient identified, Emergency Drugs available, Suction available and Patient being monitored Patient Re-evaluated:Patient Re-evaluated prior to induction Oxygen Delivery Method: Circle System Utilized Preoxygenation: Pre-oxygenation with 100% oxygen Induction Type: IV induction Ventilation: Mask ventilation without difficulty Laryngoscope Size: Mac and 3 Grade View: Grade I Tube type: Oral Number of attempts: 1 Airway Equipment and Method: Stylet and Oral airway Placement Confirmation: ETT inserted through vocal cords under direct vision, positive ETCO2 and breath sounds checked- equal and bilateral Secured at: 22 cm Tube secured with: Tape Dental Injury: Teeth and Oropharynx as per pre-operative assessment  Comments: Atraumatic induction/intubation. Dentition and oral mucosa as per preop.

## 2024-03-27 ENCOUNTER — Telehealth (HOSPITAL_COMMUNITY): Payer: Self-pay

## 2024-03-27 ENCOUNTER — Encounter (HOSPITAL_COMMUNITY): Payer: Self-pay | Admitting: Cardiovascular Disease

## 2024-03-27 NOTE — Telephone Encounter (Signed)
 Spoke with patient to complete post procedure follow up call.  Patient reports no complications with groin sites.   Instructions reviewed with patient:  Remove large bandage at puncture site after 24 hours. It is normal to have bruising, tenderness, mild swelling, and a pea or marble sized lump/knot at the groin site which can take up to three months to resolve.  Get help right away if you notice sudden swelling at the puncture site.  Check your puncture site every day for signs of infection: fever, redness, swelling, pus drainage, warmth, foul odor or excessive pain. If this occurs, please call [phone], to speak with the RN Navigator. Get help right away if your puncture site is bleeding and the bleeding does not stop after applying firm pressure to the area.  You may continue to have skipped beats/ atrial fibrillation during the first several months after your procedure.  It is very important not to miss any doses of your blood thinner Eliquis .    You will follow up with the Afib clinic 4 weeks after your procedure and follow up with APP or Dr. Nancey 3 months after your procedure. Patient requested 3 month follow up to be in [place], since she no longer lives in [place]. Address updated in system. Message sent to EP scheduling for new appointment.  Activity restrictions reviewed.  Patient verbalized understanding to all instructions provided.

## 2024-03-28 ENCOUNTER — Other Ambulatory Visit: Payer: Self-pay | Admitting: Family Medicine

## 2024-04-08 ENCOUNTER — Ambulatory Visit: Admitting: Family Medicine

## 2024-04-22 NOTE — Progress Notes (Signed)
 "  Primary Care Physician: Cook, Jayce G, DO Primary Cardiologist: Alvan Carrier, MD Electrophysiologist: Eulas FORBES Furbish, MD  Referring Physician: Eulas FORBES Furbish, MD   Render [name] [name] is a 64 y.o. female with a history of paroxysmal AF, AV node ablation s/p PPM 1995 and CRT-P with upgrade 2022, HFpEF/NICM, pulmonary  MR, obesity, lymphedema,IDA who presents for follow up in the [facility].  She was diagnosed with tachycardia and tachycardia induced cardiomyopathy with AV node ablation and PPM placed in 1995.  She had CRT P upgrade on 09/2020 with reimplantation on the right side due to infection.  She had complained of shortness of breath and underwent DCCV on 04/19/2022.  She was evaluated by Dr. Furbish on 01/17/2024 for atrial tachycardia and paroxysmal AF with ablation completed on 03/26/2024.  [name] [name] presents today with her husband for post ablation follow-up.  She is in sinus rhythm on examination and reports no episodes of palpitations.  She has been compliant with her Eliquis  and denies any missed doses.  She is back to her normal activity level and states no restrictions since her ablation.  She also notes that her groin sites healed without any complications postprocedure.  She is currently on Eliquis  and has a CHA2DS2-VASc score of 2.  She is interested in possibly discontinuing Eliquis  in the future and was advised to continue for now and speak with Dr. Furbish at her 8-month or 93-month follow-up.  She also noted some fatigue with blood pressures in the high 80s over 60s.  We will trial a reduction in her carvedilol  to 3.125 mg for 1 week and patient will contact our office if her symptoms resolved.  We reviewed triggers for A-fib and also lifestyle modifications in the future.  She had no further questions during today's visit and is looking forward to increasing her physical activity in the future.  Today, she denies symptoms of palpitations, chest  pain, shortness of breath, orthopnea, PND, lower extremity edema, dizziness, presyncope, syncope, snoring, daytime somnolence, bleeding, or neurologic sequela. The patient is tolerating medications without difficulties and is otherwise without complaint today.    Atrial Fibrillation Management history: History of Sleep Apnea patient declined study Previous antiarrhythmic drugs: None Previous cardioversions: 04/19/2022 Previous ablations: 03/26/2024 Anticoagulation history: Eliquis   ROS- All systems are reviewed and negative except as per the HPI above.  Past Medical History:  Diagnosis Date   Anemia    Atrial fibrillation (HCC)    Chronic diastolic heart failure (HCC)    EF 40-45%   Depression    history of depression in the past.   Dysrhythmia    GERD (gastroesophageal reflux disease)    Mitral valvular regurgitation    Pacemaker- medtronic    Initial PM 1995; generator 2009, infection with contralateral implant 2010   S/P AV nodal ablation    Shortness of breath dyspnea     Current Outpatient Medications  Medication Sig Dispense Refill   acetaminophen  (TYLENOL ) 500 MG tablet Take 1,000 mg by mouth 2 (two) times daily.     apixaban  (ELIQUIS ) 5 MG TABS tablet Take 1 tablet by mouth twice daily 60 tablet 5   Calcium  Carbonate Antacid (TUMS PO) Take 1 tablet by mouth daily as needed (heartburn).     carvedilol  (COREG ) 3.125 MG tablet Take 6.25 mg by mouth 2 (two) times daily.  6   cholecalciferol (VITAMIN D3) 25 MCG (1000 UNIT) tablet Take 1 tablet (1,000 Units total) by mouth daily. (Patient taking differently:  Take 4,000 Units by mouth daily.) 100 tablet 2   Cyanocobalamin  (VITAMIN B12 PO) Take 1 tablet by mouth daily.     DULoxetine  (CYMBALTA ) 60 MG capsule Take 1 capsule by mouth once daily 90 capsule 0   empagliflozin  (JARDIANCE ) 10 MG TABS tablet Take 1 tablet (10 mg total) by mouth daily. 90 tablet 3   losartan  (COZAAR ) 25 MG tablet Take 1 tablet (25 mg total) by mouth  daily. 30 tablet 6   Melatonin 10 MG TABS Take 20 mg by mouth at bedtime.      Multiple Vitamins-Minerals (MULTIVITAMIN WITH MINERALS) tablet Take 1 tablet by mouth daily. With Iron      rosuvastatin  (CRESTOR ) 20 MG tablet Take 20 mg by mouth daily.     rosuvastatin  (CRESTOR ) 40 MG tablet Take 40 mg by mouth daily.     spironolactone  (ALDACTONE ) 25 MG tablet Take 1/2 (one-half) tablet by mouth once daily 45 tablet 0   torsemide  (DEMADEX ) 20 MG tablet Take 4 tablets (80 mg total) by mouth daily. 120 tablet 6   traZODone  (DESYREL ) 100 MG tablet Take 2 tablets (200 mg total) by mouth at bedtime. 180 tablet 1   No current facility-administered medications for this encounter.    Physical Exam: BP (!) 88/68   Pulse 78   Ht 5' 5 (1.651 m)   Wt 89.1 kg   BMI 32.68 kg/m   GEN: Well nourished, well developed in no acute distress NECK: No JVD; No carotid bruits CARDIAC: Regular rate and rhythm, no murmurs, rubs, gallops RESPIRATORY:  Clear to auscultation without rales, wheezing or rhonchi  ABDOMEN: Soft, non-tender, non-distended EXTREMITIES:  No edema; No deformity   Wt Readings from Last 3 Encounters:  04/23/24 89.1 kg  03/26/24 90.7 kg  01/17/24 91.4 kg     EKG today demonstrates:   EKG Interpretation Date/Time:  Wednesday April 23 2024 14:15:06 EST Ventricular Rate:  78 PR Interval:  176 QRS Duration:  146 QT Interval:  454 QTC Calculation: 517 R Axis:   184  Text Interpretation: Atrial-sensed ventricular-paced rhythm in a pattern of bigeminy Abnormal ECG When compared with ECG of 26-Mar-2024 10:18, Vent. rate has decreased BY  35 BPM Confirmed by Wyn Manus [phone]) on 04/23/2024 2:17:01 PM        Echo Completed 05/08/2018: Left ventricle: The cavity size was mildly to moderately dilated.    Wall thickness was normal. Systolic function was mildly to    moderately reduced. The estimated ejection fraction was in the    range of 40% to 45%. Diffuse hypokinesis. Septal  motion    consistent with RV pacing. Left ventricular diastolic function    parameters were normal.   Mitral valve: Mildly calcified annulus. Mildly thickened   leaflets. There was moderate regurgitation directed posteriorly.   PISA calculations suggest mild regurgitation, however color   Doppler is most consistent with moderate regurgitation.   Left atrium: The atrium was moderately dilated.  Right ventricle: Pacer wire or catheter noted in right ventricle.  Right atrium: Central venous pressure (est): 3 mm Hg.  atrial septum: No defect or patent foramen ovale was identified.   Tricuspid valve: There was mild-moderate regurgitation.   Pulmonary arteries: PA peak pressure: 43 mm Hg (S).   Pericardium, extracardiac: There was no pericardial effusion.   CHA2DS2-VASc Score = 2  The patient's score is based upon: CHF History: 1 HTN History: 0 Diabetes History: 0 Stroke History: 0 Vascular Disease History: 0 Age Score: 0 Gender Score: 1  ASSESSMENT AND PLAN: Paroxysmal Atrial Fibrillation (ICD10:  I48.0) The patient's CHA2DS2-VASc score is 2, indicating a 2.2% annual risk of stroke.   -s/p AF ablation on 03/2024 and is in sinus rhythm has been compliant with her Eliquis   -She is interested in discontinuing Eliquis  in the future with a CHA2DS2-VASc score of 2 and I advised her that a shared decision would need to be made between her and Dr. Nancey in the future if she wishes to proceed with discontinuing Eliquis . - She was advised to continue for now Eliquis  5 mg twice daily - She reports some fatigue and will reduce carvedilol  to 3.125 mg twice daily for 1 week with plan to increase back to 6.25 mg if fatigue does not improve.   HFrecEF/NICM: -s/p CRT-P with recovered EF - Patient is euvolemic on exam  History of CHB: -s/p Medtronic CRT-P - Currently managed by EP  Signed,  Wyn Raddle, [name] Shove, NP    04/23/2024 2:51 PM    Follow up with the AF Clinic in as  scheduled  "

## 2024-04-23 ENCOUNTER — Encounter (HOSPITAL_COMMUNITY): Payer: Self-pay | Admitting: Nurse Practitioner

## 2024-04-23 ENCOUNTER — Ambulatory Visit (HOSPITAL_COMMUNITY)
Admission: RE | Admit: 2024-04-23 | Discharge: 2024-04-23 | Disposition: A | Discharge status: Home / Self Care | Source: Ambulatory Visit | Attending: Nurse Practitioner | Admitting: Nurse Practitioner

## 2024-04-23 VITALS — BP 88/68 | HR 78 | Ht 65.0 in | Wt 196.4 lb

## 2024-04-23 DIAGNOSIS — I428 Other cardiomyopathies: Secondary | ICD-10-CM

## 2024-04-23 DIAGNOSIS — I5020 Unspecified systolic (congestive) heart failure: Secondary | ICD-10-CM

## 2024-04-23 DIAGNOSIS — I480 Paroxysmal atrial fibrillation: Secondary | ICD-10-CM

## 2024-04-23 DIAGNOSIS — I442 Atrioventricular block, complete: Secondary | ICD-10-CM

## 2024-05-06 ENCOUNTER — Ambulatory Visit: Payer: 59

## 2024-05-13 ENCOUNTER — Ambulatory Visit: Payer: 59

## 2024-05-14 LAB — CUP PACEART REMOTE DEVICE CHECK
Battery Remaining Longevity: 59 mo
Battery Voltage: 2.96 V
Brady Statistic AP VP Percent: 31.56 %
Brady Statistic AP VS Percent: 0 %
Brady Statistic AS VP Percent: 68.43 %
Brady Statistic AS VS Percent: 0.02 %
Brady Statistic RA Percent Paced: 30.94 %
Brady Statistic RV Percent Paced: 99.98 %
Date Time Interrogation Session: 20260202231009
Implantable Lead Connection Status: 753985
Implantable Lead Connection Status: 753985
Implantable Lead Connection Status: 753985
Implantable Lead Implant Date: 20100201
Implantable Lead Implant Date: 20100201
Implantable Lead Implant Date: 20220706
Implantable Lead Location: 753858
Implantable Lead Location: 753859
Implantable Lead Location: 753860
Implantable Lead Model: 4598
Implantable Lead Model: 5076
Implantable Lead Model: 5076
Implantable Pulse Generator Implant Date: 20220706
Lead Channel Impedance Value: 304 Ohm
Lead Channel Impedance Value: 323 Ohm
Lead Channel Impedance Value: 399 Ohm
Lead Channel Impedance Value: 418 Ohm
Lead Channel Impedance Value: 418 Ohm
Lead Channel Impedance Value: 475 Ohm
Lead Channel Impedance Value: 494 Ohm
Lead Channel Impedance Value: 494 Ohm
Lead Channel Impedance Value: 608 Ohm
Lead Channel Impedance Value: 760 Ohm
Lead Channel Impedance Value: 779 Ohm
Lead Channel Impedance Value: 798 Ohm
Lead Channel Impedance Value: 855 Ohm
Lead Channel Impedance Value: 874 Ohm
Lead Channel Pacing Threshold Amplitude: 1.625 V
Lead Channel Pacing Threshold Pulse Width: 0.4 ms
Lead Channel Sensing Intrinsic Amplitude: 0.5 mV
Lead Channel Sensing Intrinsic Amplitude: 0.5 mV
Lead Channel Sensing Intrinsic Amplitude: 3.625 mV
Lead Channel Setting Pacing Amplitude: 2 V
Lead Channel Setting Pacing Amplitude: 2 V
Lead Channel Setting Pacing Amplitude: 3.5 V
Lead Channel Setting Pacing Pulse Width: 0.4 ms
Lead Channel Setting Pacing Pulse Width: 0.6 ms
Lead Channel Setting Sensing Sensitivity: 0.9 mV
Zone Setting Status: 755011

## 2024-06-13 ENCOUNTER — Ambulatory Visit: Admitting: Cardiovascular Disease

## 2024-06-24 ENCOUNTER — Ambulatory Visit: Admitting: Physician Assistant

## 2024-08-05 ENCOUNTER — Ambulatory Visit: Payer: 59

## 2024-08-12 ENCOUNTER — Ambulatory Visit: Payer: 59

## 2024-11-04 ENCOUNTER — Ambulatory Visit: Payer: 59

## 2024-11-11 ENCOUNTER — Ambulatory Visit: Payer: 59

## 2025-02-03 ENCOUNTER — Ambulatory Visit: Payer: 59

## 2025-02-10 ENCOUNTER — Ambulatory Visit: Payer: 59

## 2025-05-05 ENCOUNTER — Ambulatory Visit: Payer: 59
